# Patient Record
Sex: Female | Born: 1940
Health system: Southern US, Community
[De-identification: ages and names within clinical notes are randomized; demographics above are authoritative.]

## PROBLEM LIST (undated history)

## (undated) DIAGNOSIS — K219 Gastro-esophageal reflux disease without esophagitis: Secondary | ICD-10-CM

## (undated) DIAGNOSIS — D649 Anemia, unspecified: Secondary | ICD-10-CM

## (undated) DIAGNOSIS — Z8619 Personal history of other infectious and parasitic diseases: Secondary | ICD-10-CM

## (undated) DIAGNOSIS — I2510 Atherosclerotic heart disease of native coronary artery without angina pectoris: Secondary | ICD-10-CM

## (undated) DIAGNOSIS — D219 Benign neoplasm of connective and other soft tissue, unspecified: Secondary | ICD-10-CM

## (undated) DIAGNOSIS — I10 Essential (primary) hypertension: Secondary | ICD-10-CM

## (undated) DIAGNOSIS — E039 Hypothyroidism, unspecified: Secondary | ICD-10-CM

## (undated) DIAGNOSIS — E119 Type 2 diabetes mellitus without complications: Secondary | ICD-10-CM

## (undated) DIAGNOSIS — E785 Hyperlipidemia, unspecified: Secondary | ICD-10-CM

## (undated) HISTORY — DX: Personal history of other infectious and parasitic diseases: Z86.19

## (undated) HISTORY — PX: TUBAL LIGATION: SHX77

## (undated) HISTORY — DX: Gastro-esophageal reflux disease without esophagitis: K21.9

## (undated) HISTORY — DX: Atherosclerotic heart disease of native coronary artery without angina pectoris: I25.10

## (undated) HISTORY — DX: Type 2 diabetes mellitus without complications: E11.9

## (undated) HISTORY — DX: Hyperlipidemia, unspecified: E78.5

## (undated) HISTORY — DX: Anemia, unspecified: D64.9

## (undated) HISTORY — DX: Hypothyroidism, unspecified: E03.9

## (undated) HISTORY — DX: Benign neoplasm of connective and other soft tissue, unspecified: D21.9

---

## 2004-08-25 ENCOUNTER — Ambulatory Visit: Payer: Self-pay | Admitting: Internal Medicine

## 2004-09-10 ENCOUNTER — Ambulatory Visit: Payer: Self-pay | Admitting: Internal Medicine

## 2006-12-07 ENCOUNTER — Ambulatory Visit: Payer: Self-pay | Admitting: Internal Medicine

## 2006-12-27 ENCOUNTER — Ambulatory Visit: Payer: Self-pay | Admitting: Gastroenterology

## 2008-01-19 ENCOUNTER — Ambulatory Visit: Payer: Self-pay | Admitting: Internal Medicine

## 2009-01-21 ENCOUNTER — Ambulatory Visit: Payer: Self-pay | Admitting: Internal Medicine

## 2010-04-01 ENCOUNTER — Ambulatory Visit: Payer: Self-pay | Admitting: Internal Medicine

## 2010-04-03 ENCOUNTER — Ambulatory Visit: Payer: Self-pay | Admitting: Cardiology

## 2010-06-16 ENCOUNTER — Ambulatory Visit: Payer: Self-pay | Admitting: Gastroenterology

## 2010-06-19 LAB — PATHOLOGY REPORT

## 2012-04-05 ENCOUNTER — Ambulatory Visit: Payer: Self-pay | Admitting: Internal Medicine

## 2012-04-05 IMAGING — MG MM CAD SCREENING MAMMO
1 series · 4 of 4 positions shown · non-contrast
Comparison: none

REASON FOR EXAM: SCR MAMMO NO ORDER
COMMENTS:

[Series 7731: R CC · right · 4 of 4 slices shown]
[im 1/4]
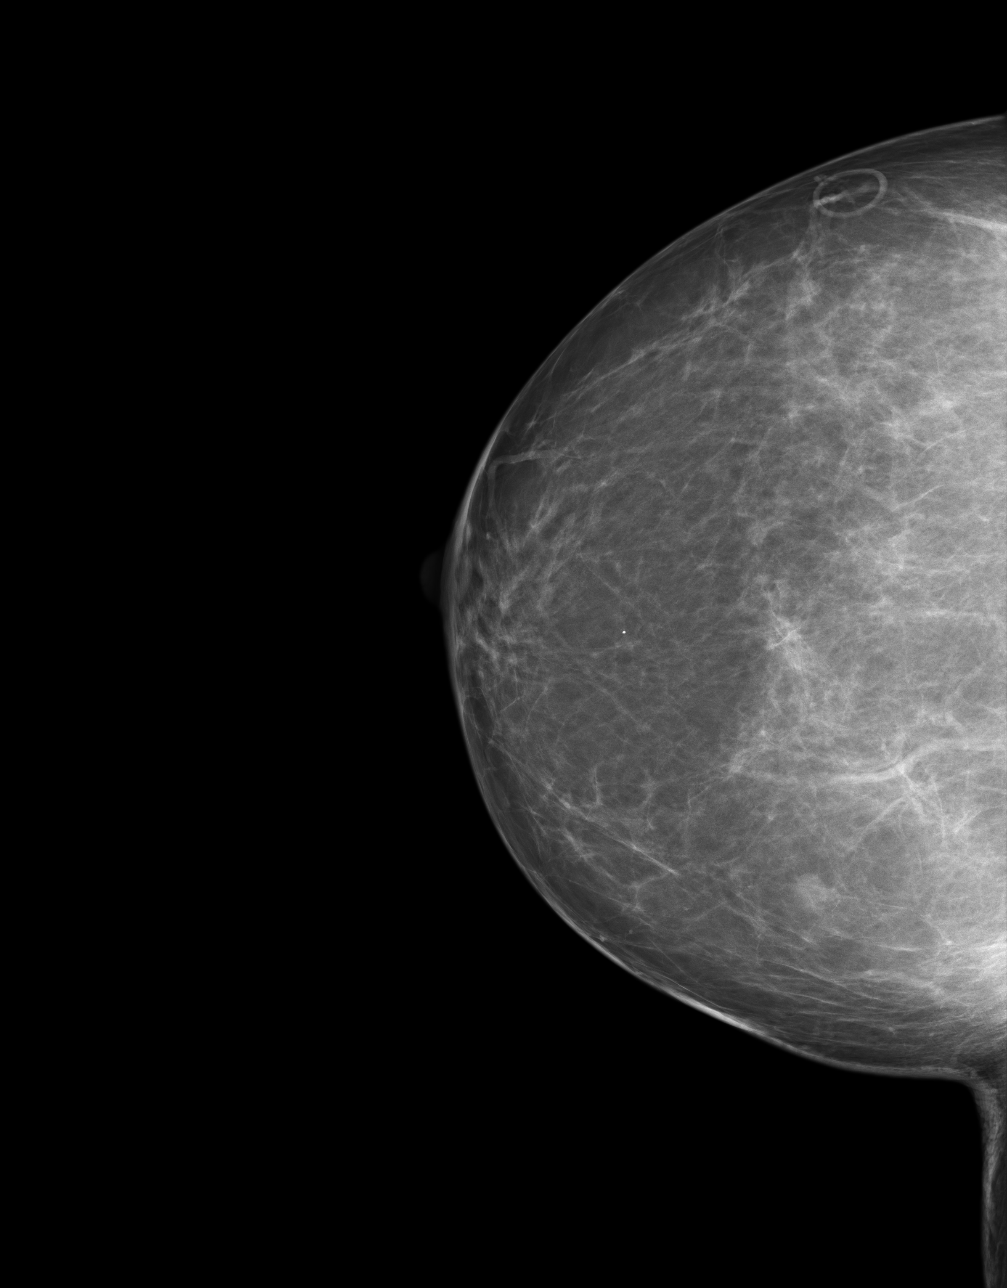
[im 2/4]
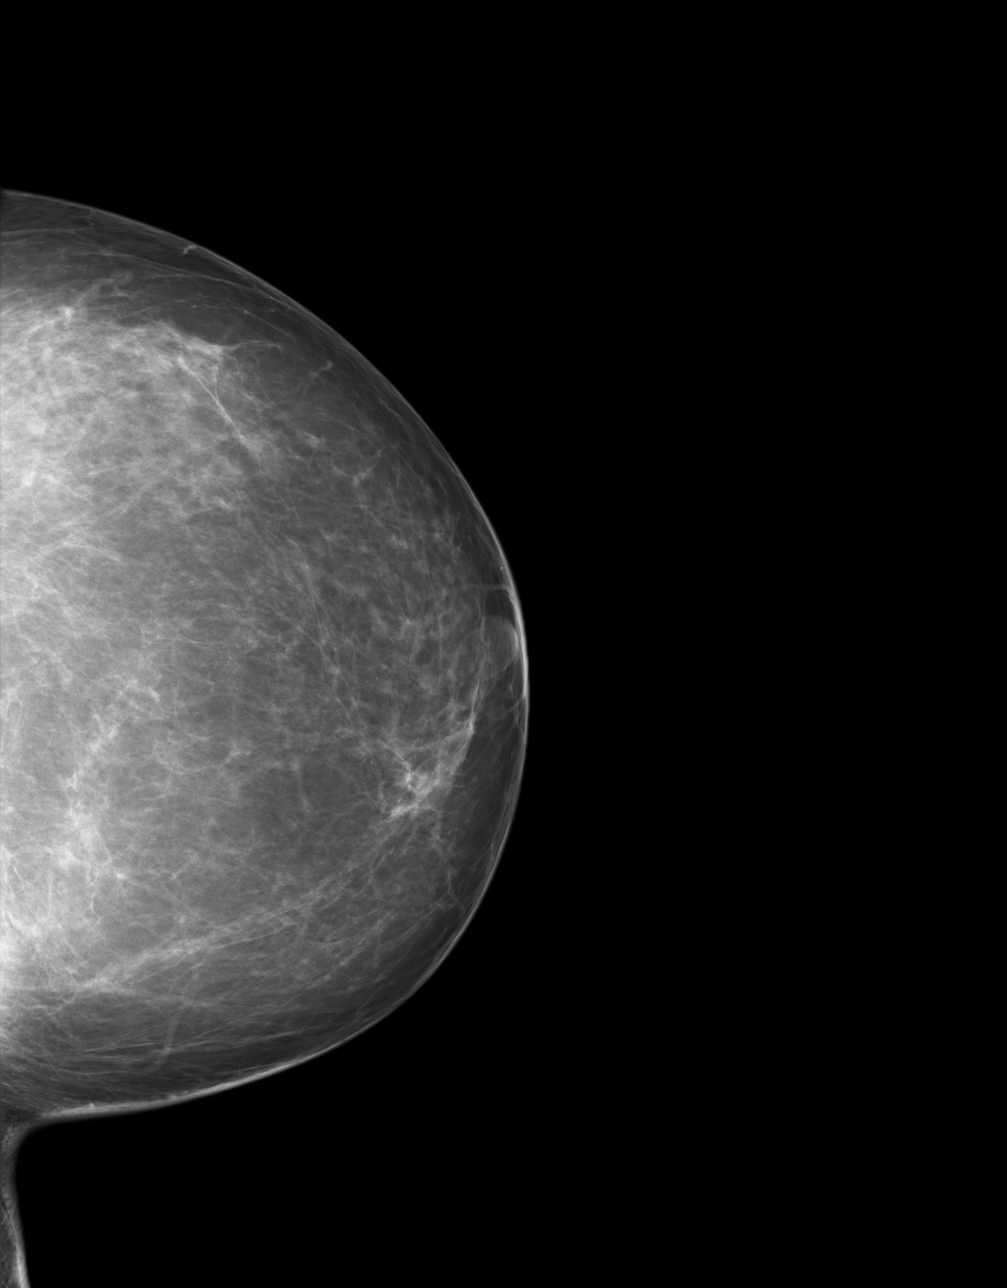
[im 3/4]
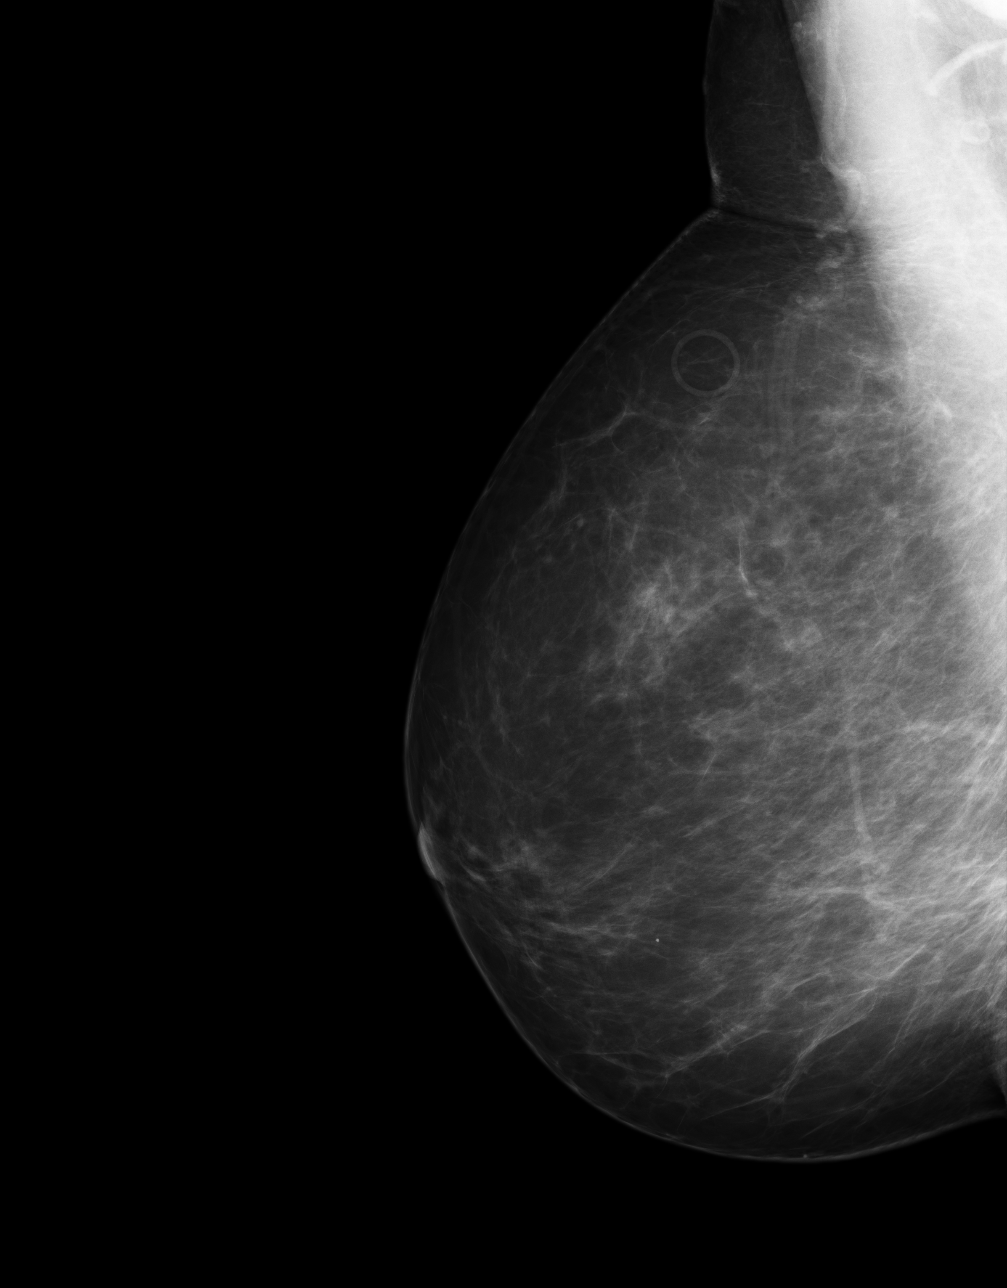
[im 4/4]
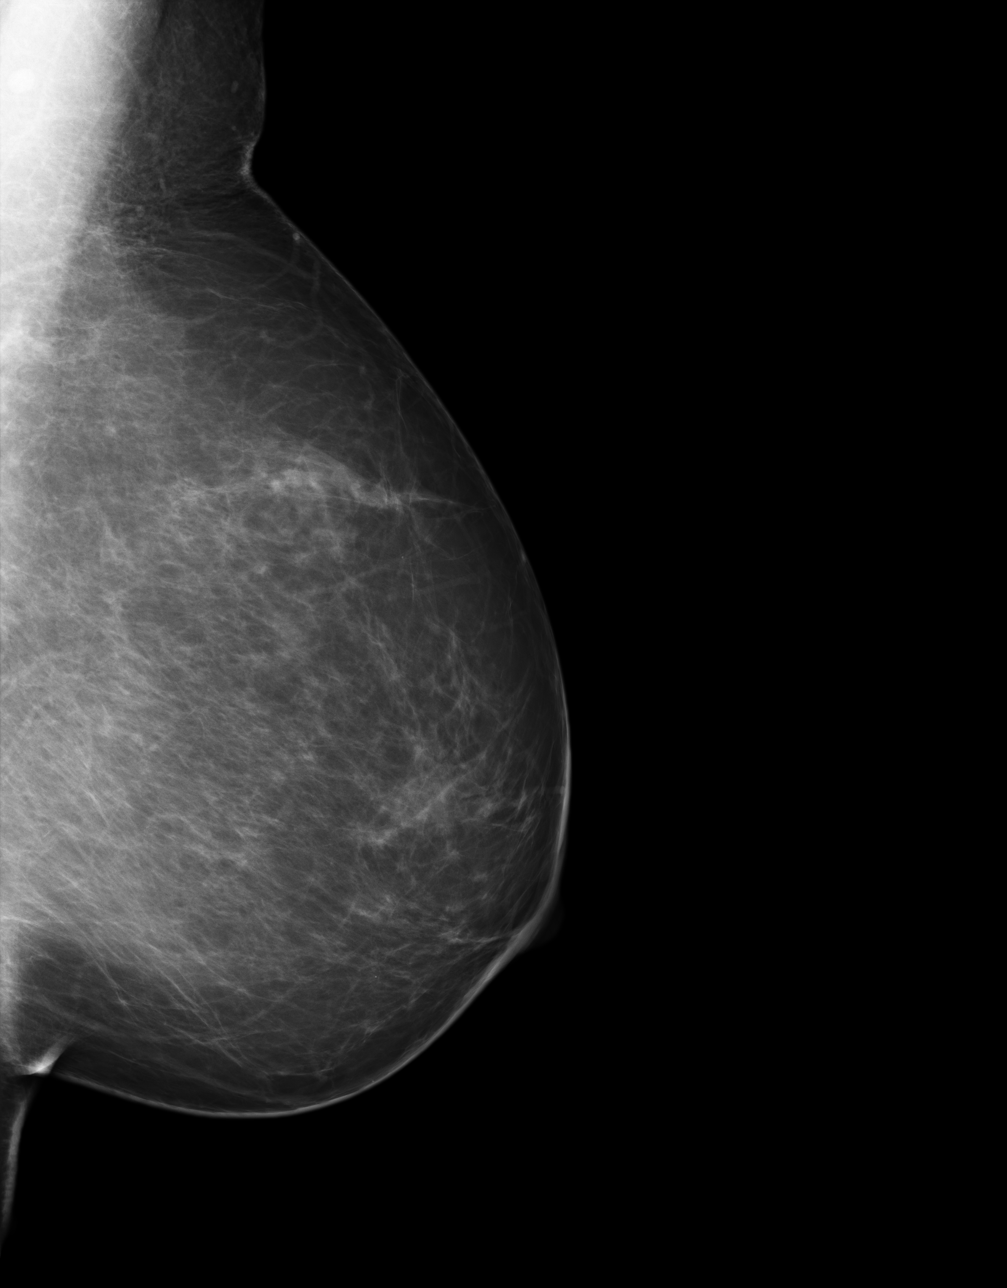

[4 of 4 positions shown; findings below may reference images not displayed]

PROCEDURE:     MAM - MAM DGTL SCRN MAM NO ORDER W/CAD  - [DATE]  [DATE]

RESULT:     There is no known family history of breast cancer. There is no
known history of breast surgery. Comparison is made to previous digital
mammographic images dated [DATE], well as [DATE] and [DATE].  The breasts exhibit a mild to moderately dense parenchymal pattern.
There is an area of nodular increased parenchymal density medially in the
right breast which appears to be grossly stable when compared to multiple
previous studies. No definite developing parenchymal density or dominant
mass is evident otherwise. No malignant appearing calcification or
architectural distortion is evident.
IMPRESSION: 1.Stable, benign appearing bilateral mammogram.

BI-RADS: Category 2 - Benign Findings

RECOMMENDATIONS:

1. Please continue to encourage annual mammographic follow-up.

A NEGATIVE MAMMOGRAM REPORT DOES NOT PRECLUDE BIOPSY OR OTHER EVALUATION OF
A CLINICALLY PALPABLE OR OTHERWISE SUSPICIOUS MASS OR LESION. BREAST CANCER
MAY NOT BE DETECTED BY MAMMOGRAPHY IN UP TO 10% OF CASES.

Dictation Site:1

## 2013-05-01 ENCOUNTER — Ambulatory Visit: Payer: Self-pay | Admitting: Internal Medicine

## 2013-05-01 IMAGING — MG MM CAD SCREENING MAMMO
1 series · 4 of 4 positions shown · non-contrast
Comparison: none

REASON FOR EXAM: SCR MAMMO NO ORDER
COMMENTS:

[R CC · right · 4 of 4 slices shown]
[im 1/4]
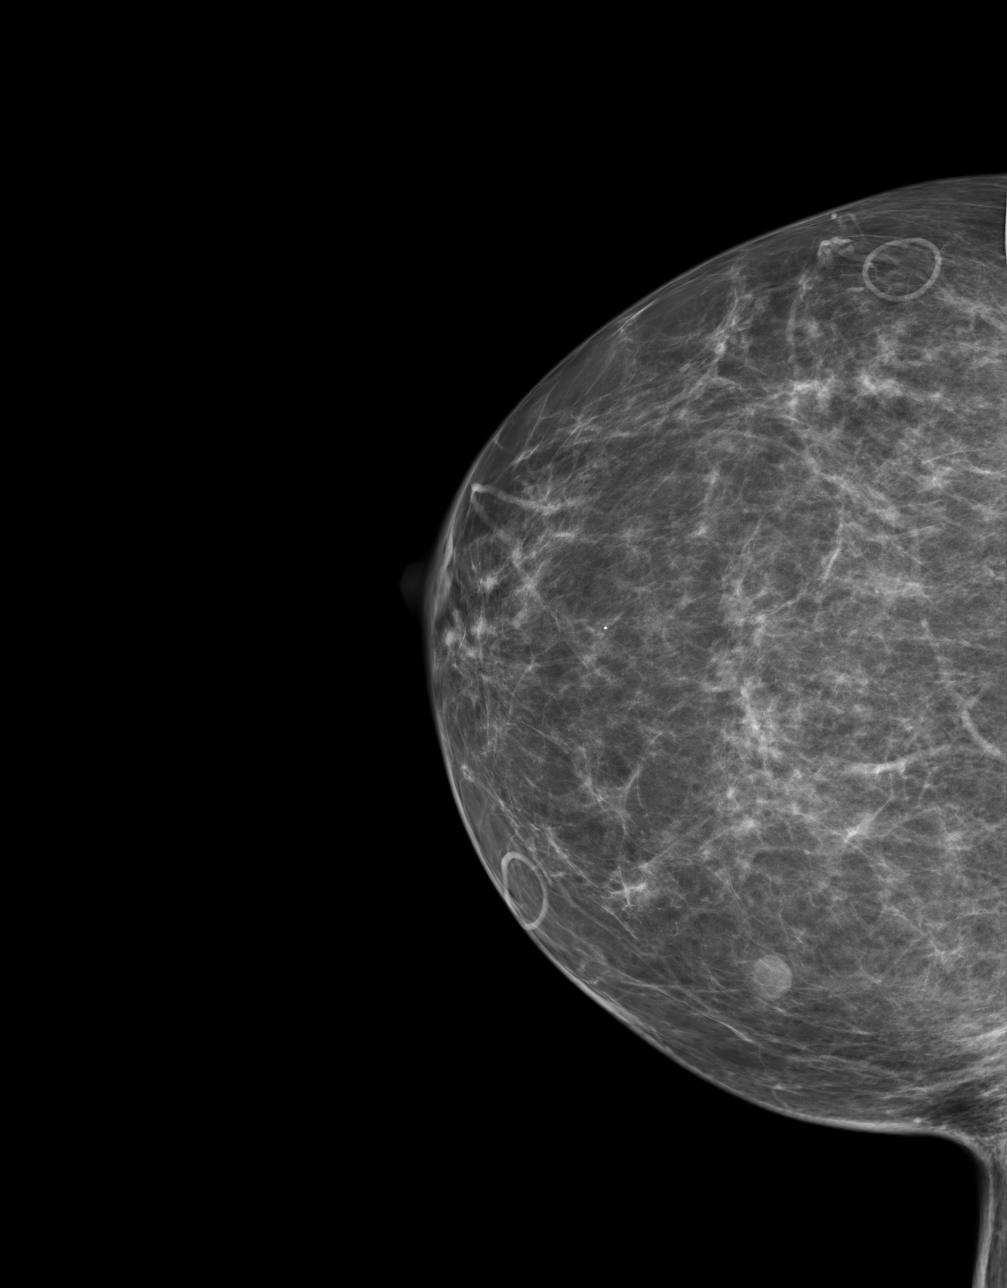
[im 2/4]
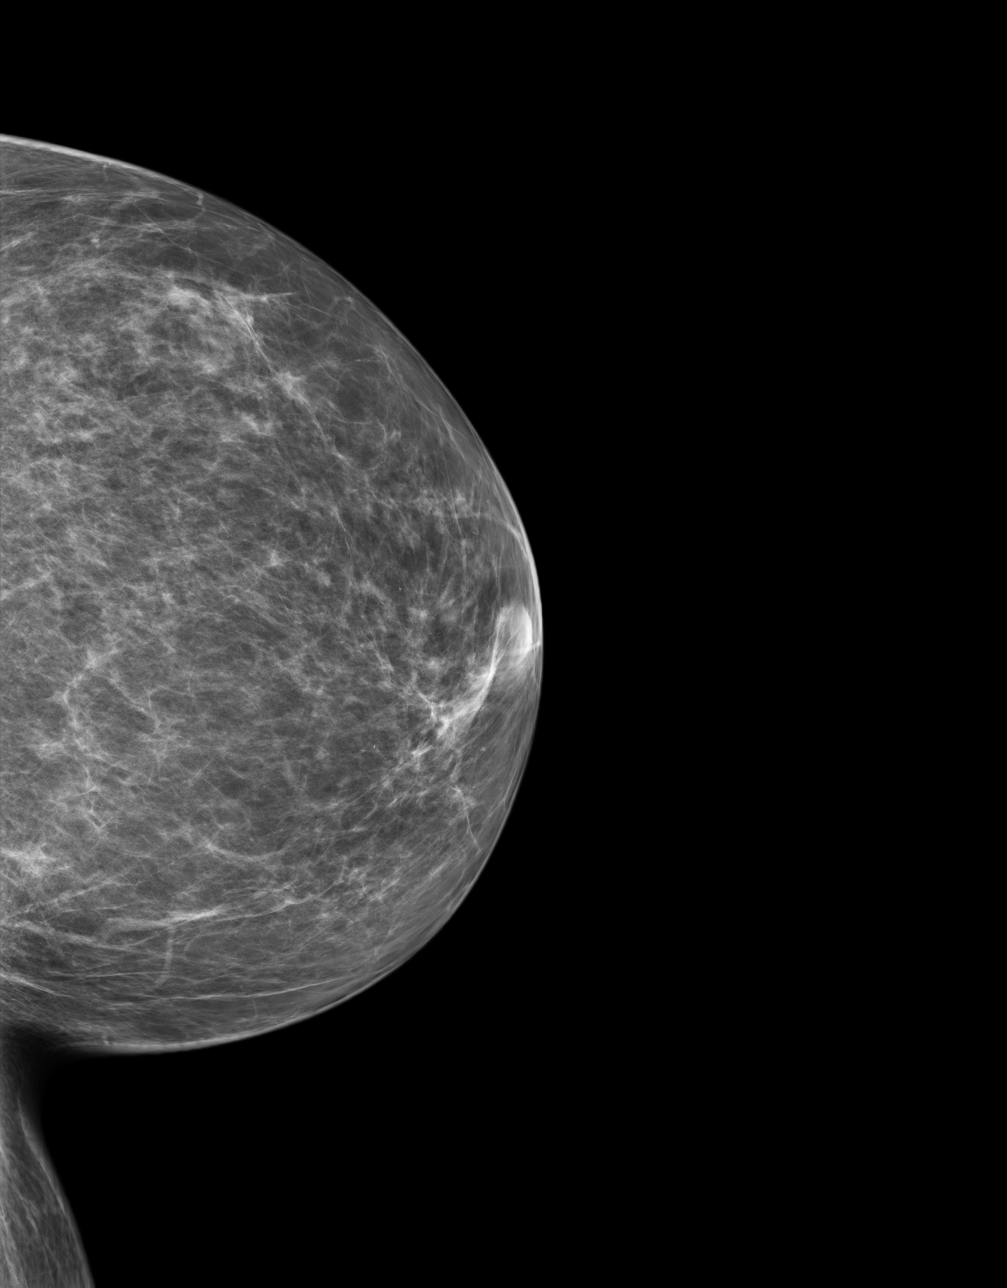
[im 3/4]
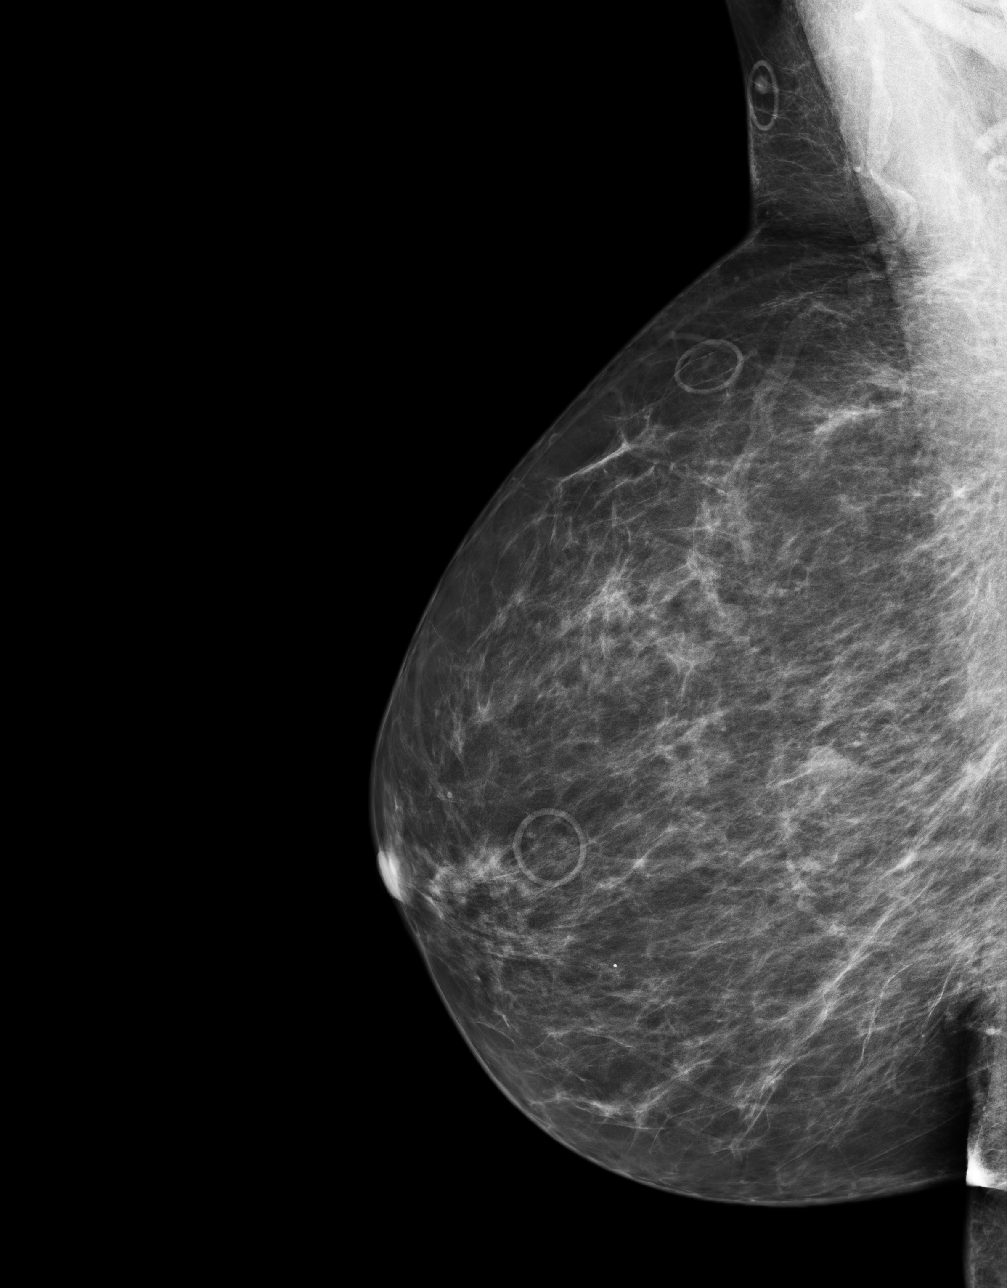
[im 4/4]
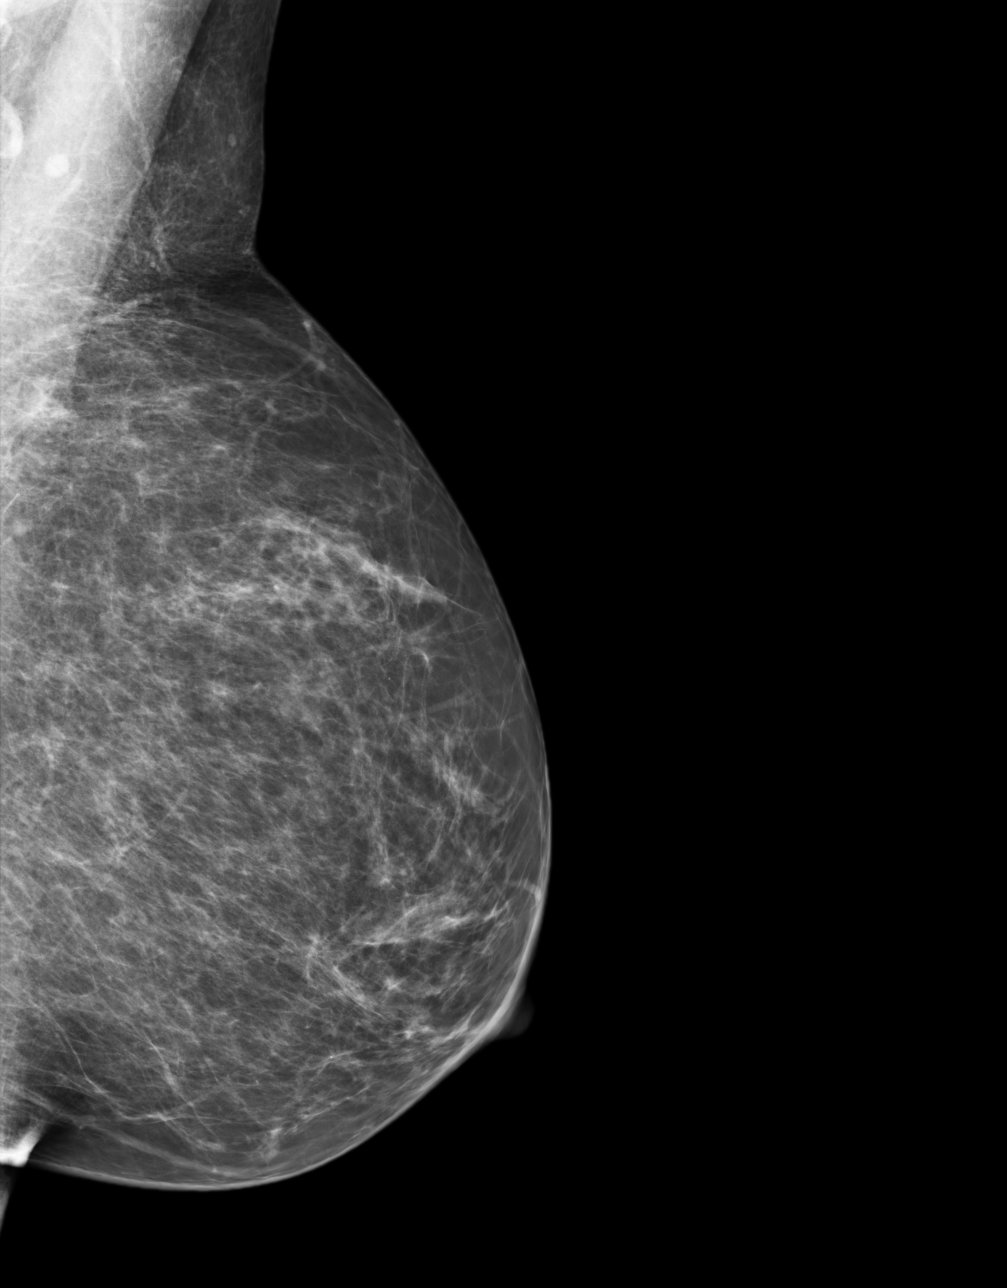

[4 of 4 positions shown; findings below may reference images not displayed]

PROCEDURE:     MAM - MAM DGTL SCRN MAM NO ORDER W/CAD  - [DATE]  [DATE]

RESULT:     Comparison is made to previous digital studies [DATE],[DATE], and [DATE].

The breasts exhibit a scattered fibroglandular pattern. There are
benign-appearing lymph nodes in the axillary regions. Skin nevi were marked
on the right. There is stable nodularity medially in the midportion of the
right breast. There are no malignant appearing groupings of
microcalcification.
IMPRESSION: There are no findings suspicious for malignancy.

BI-RADS 2: Benign findings.

Recommendation: please continue to encourage yearly mammographic followup.

BREAST COMPOSITION: The breast composition is SCATTERED FIBROGLANDULAR
TISSUE (glandular tissue is 25-50%)

A NEGATIVE MAMMOGRAM REPORT DOES NOT PRECLUDE BIOPSY OR OTHER EVALUATION OF
A CLINICALLY PALPABLE OR OTHERWISE SUSPICIOUS MASS OR LESION. BREAST CANCER
MAY NOT BE DETECTED BY MAMMOGRAPHY IN UP TO 10% OF CASES.

Dictation site:1

## 2014-06-25 ENCOUNTER — Ambulatory Visit: Payer: Self-pay | Admitting: Family Medicine

## 2014-06-25 LAB — HM MAMMOGRAPHY

## 2014-06-25 IMAGING — MG MM DIGITAL SCREENING BILAT W/ CAD
5 series · 5 of 5 positions shown · non-contrast
Comparison: Previous exam(s).

CLINICAL DATA: Screening.

EXAM:
DIGITAL SCREENING BILATERAL MAMMOGRAM WITH CAD

[L CC]
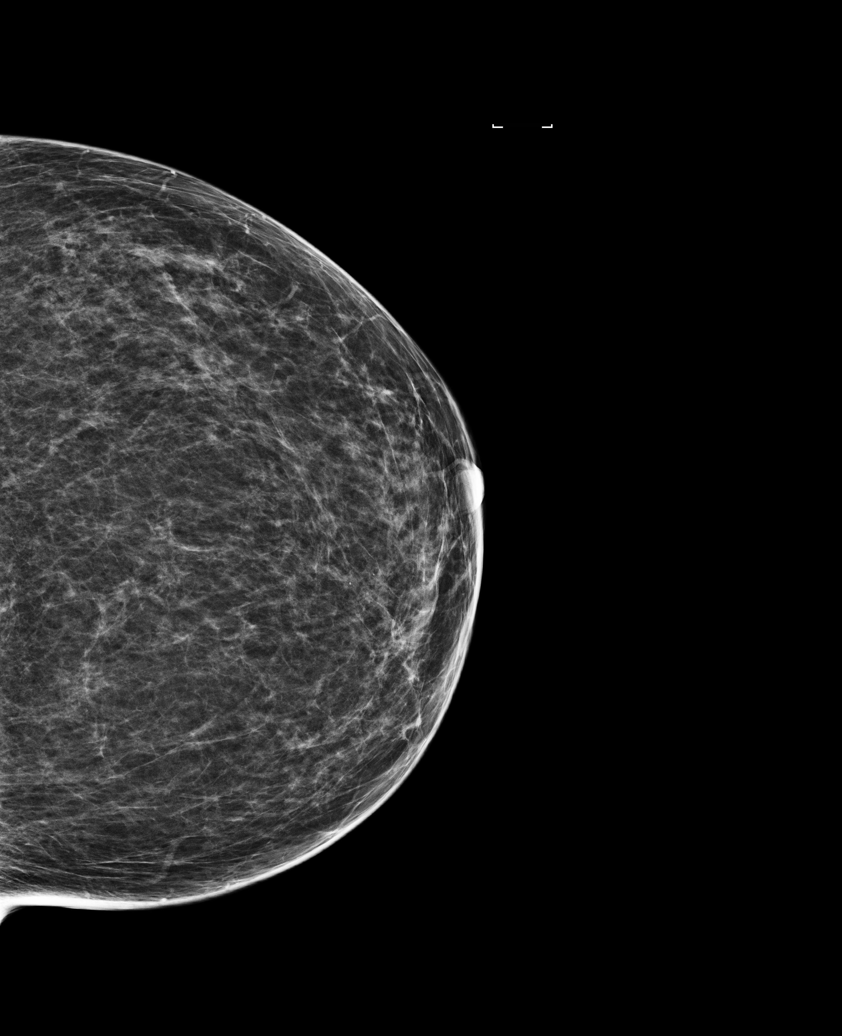

[L MLO]
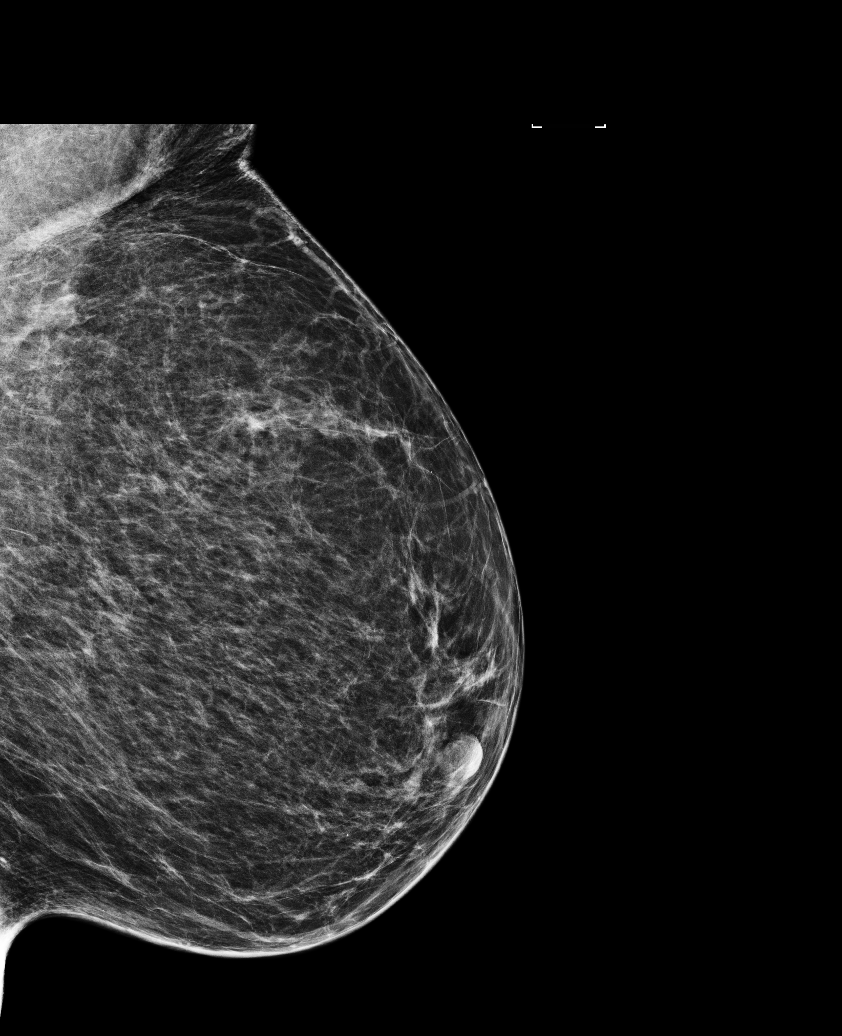

[R CC]
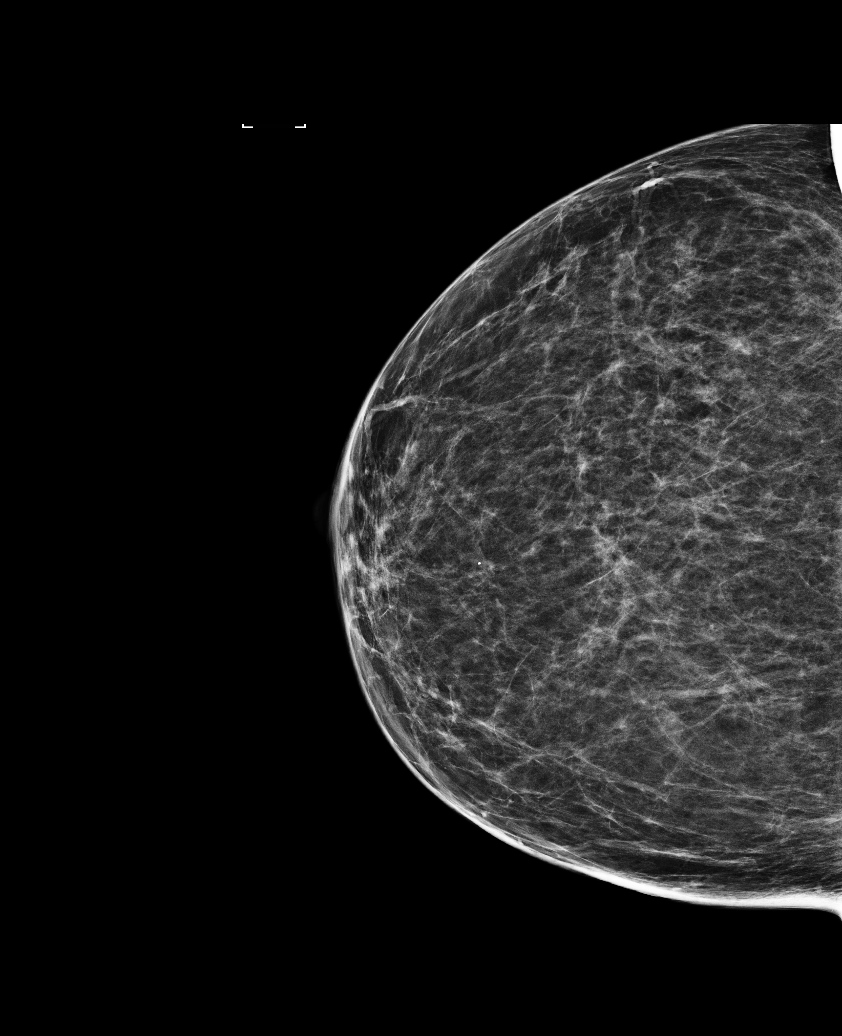

[R XCCL]
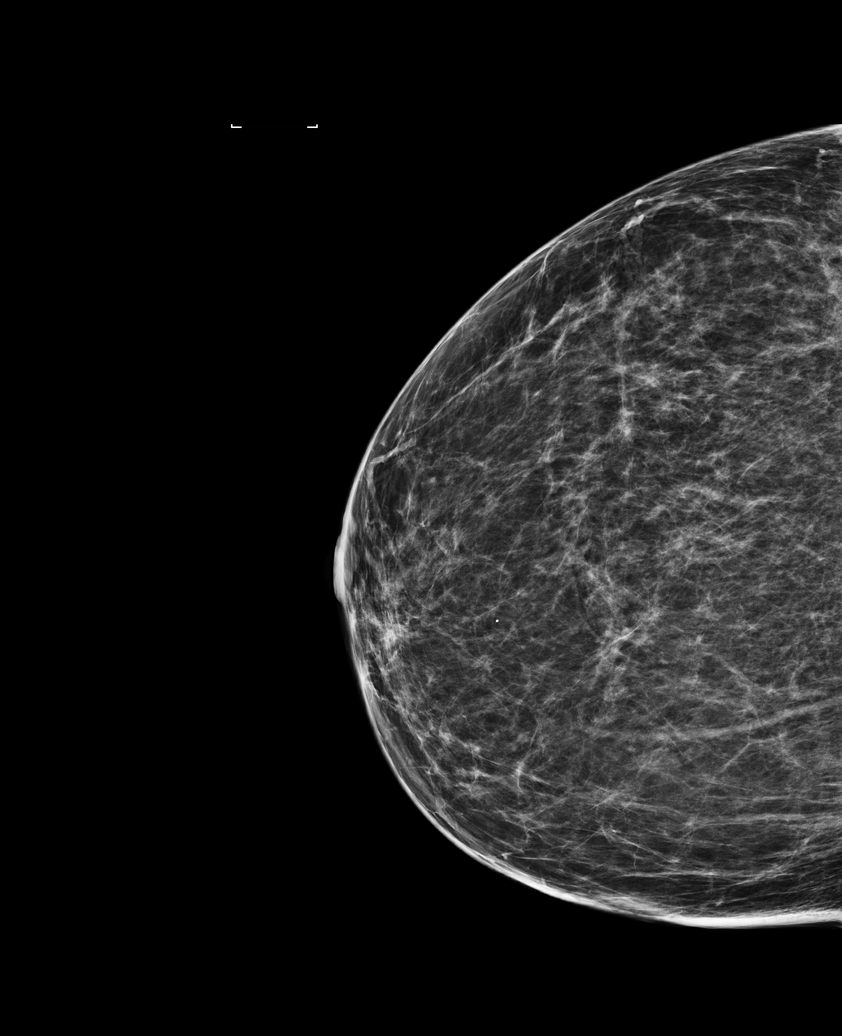

[R MLO]
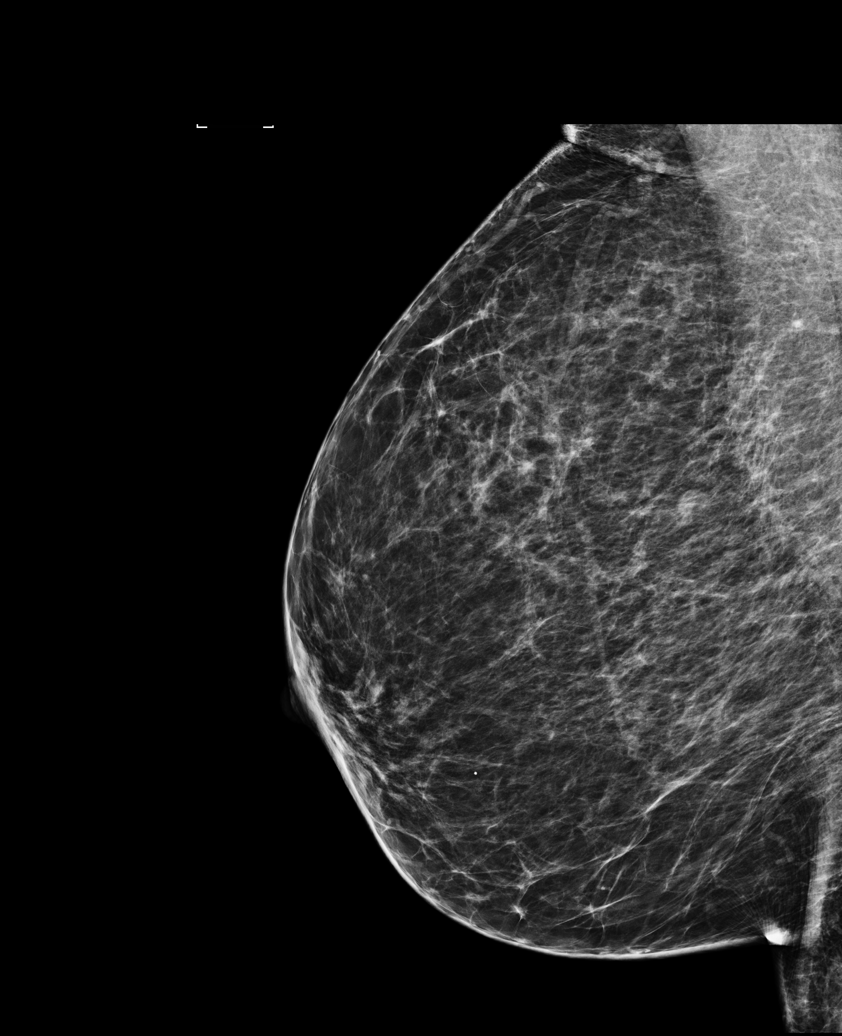

[5 of 5 positions shown; findings below may reference images not displayed]

ACR Breast Density Category b: There are scattered areas of
fibroglandular density.
FINDINGS: There are no findings suspicious for malignancy. Images were
processed with CAD.
IMPRESSION: No mammographic evidence of malignancy. A result letter of this
screening mammogram will be mailed directly to the patient.

RECOMMENDATION:
Screening mammogram in one year. (Code:[US])

BI-RADS CATEGORY  1: Negative.

## 2014-08-06 ENCOUNTER — Encounter (INDEPENDENT_AMBULATORY_CARE_PROVIDER_SITE_OTHER): Payer: Self-pay

## 2014-08-06 ENCOUNTER — Ambulatory Visit (INDEPENDENT_AMBULATORY_CARE_PROVIDER_SITE_OTHER): Payer: Medicare Other | Admitting: Internal Medicine

## 2014-08-06 ENCOUNTER — Encounter: Payer: Self-pay | Admitting: Internal Medicine

## 2014-08-06 VITALS — BP 120/80 | HR 69 | Temp 98.1°F | Ht 63.25 in | Wt 140.1 lb

## 2014-08-06 DIAGNOSIS — D259 Leiomyoma of uterus, unspecified: Secondary | ICD-10-CM

## 2014-08-06 DIAGNOSIS — I10 Essential (primary) hypertension: Secondary | ICD-10-CM

## 2014-08-06 DIAGNOSIS — E7800 Pure hypercholesterolemia, unspecified: Secondary | ICD-10-CM

## 2014-08-06 DIAGNOSIS — H6123 Impacted cerumen, bilateral: Secondary | ICD-10-CM

## 2014-08-06 DIAGNOSIS — E119 Type 2 diabetes mellitus without complications: Secondary | ICD-10-CM

## 2014-08-06 DIAGNOSIS — K219 Gastro-esophageal reflux disease without esophagitis: Secondary | ICD-10-CM

## 2014-08-06 DIAGNOSIS — E038 Other specified hypothyroidism: Secondary | ICD-10-CM

## 2014-08-06 DIAGNOSIS — Z23 Encounter for immunization: Secondary | ICD-10-CM

## 2014-08-06 MED ORDER — CARBAMIDE PEROXIDE 6.5 % OT SOLN: OTIC | Status: DC

## 2014-08-06 NOTE — Progress Notes (Signed)
Pre visit review using our clinic review tool, if applicable. No additional management support is needed unless otherwise documented below in the visit note. 

## 2014-08-06 NOTE — Progress Notes (Signed)
Subjective:    Patient ID: Mariah Moody, female    DOB: 1940-11-07, 73 y.o.   MRN: [id number]  HPI 73 year old female with past history of hypertension, hypercholesterolemia, diabetes, hypothyroidism and GERD who comes in today to follow up on these issues as well as to establish care.  Former pt of Dr Arline Asp and most recently Dr  Rogelia Mire.  She has a history of hypertension and hypercholesterolemia.  Intolerant of statins.  Has tried various statins.  Noted increased fatigue and muscle aches.  Has diabetes.  On no medications.  Has been seeing Dr Enzo Bi.  Diagnosed with fibroids.  All normal paps.  Has GERD.  On protonix in the am.  Still some occasional acid reflux.  Has had EGD.  Does report ears are stopped up.  No congestion.  Previous sore throat, but this resolved.  Has removed some ear wax from her ears.  Had colonoscopy 6 years ago.  Quit smoking 30 years ago.     Past Medical History  Diagnosis Date  . Hypothyroidism   . Diabetes mellitus without complication   . History of chicken pox   . GERD (gastroesophageal reflux disease)   . Hyperlipidemia     Outpatient Encounter Prescriptions as of 08/06/2014  Medication Sig  . CALCIUM PO Take by mouth.  . carbamide peroxide (DEBROX) 6.5 % otic solution 4 drops in each ear daily.  Massage for approximately 5 minutes.  . Cholecalciferol (VITAMIN D PO) Take by mouth.  . Estradiol-Norethindrone Acet [facility] TD) Place onto the skin. 250/50  . hydrochlorothiazide (MICROZIDE) 12.5 MG capsule Take 12.5 mg by mouth daily.  Marland Kitchen levothyroxine (SYNTHROID, LEVOTHROID) 50 MCG tablet Take 50 mcg by mouth daily before breakfast.  . losartan-hydrochlorothiazide (HYZAAR) 50-12.5 MG per tablet Take 1 tablet by mouth daily.  . Multiple Vitamins-Minerals (MULTIVITAMIN PO) Take by mouth. One a Day  . pantoprazole (PROTONIX) 40 MG tablet Take 40 mg by mouth daily.    Review of Systems Patient denies any headache, lightheadedness or dizziness.   No sinus congestion or sore throat.  Describes her ears being stopped up.  No pain.  No chest pain, tightness or palpitations.  No increased shortness of breath, cough or congestion.  No nausea or vomiting.  History of acid reflux.  On protonix.  Still some issues in the evening.  No abdominal pain or cramping.  No bowel change, such as diarrhea, constipation, BRBPR or melana.  No urine change.  Quit smoking 30 years ago.  Seeing Dr Enzo Bi.  Has f/u planned next month.  No pain now.        Objective:   Physical Exam Filed Vitals:   08/06/14 0903  BP: 120/80  Pulse: 69  Temp: 98.1 F (2.54 C)   73 year old female in no acute distress.   HEENT:  Nares- clear.  Oropharynx - without lesions. NECK:  Supple.  Nontender.  No audible bruit.  HEART:  Appears to be regular. LUNGS:  No crackles or wheezing audible.  Respirations even and unlabored.  RADIAL PULSE:  Equal bilaterally. ABDOMEN:  Soft, nontender.  Bowel sounds present and normal.  No audible abdominal bruit.   EXTREMITIES:  No increased edema present.  DP pulses palpable and equal bilaterally.      FEET:  No lesions.       Assessment & Plan:  HEALTH MAINTENANCE.  Schedule her for a physical.  States had mammogram this year.  Colonoscopy 6 years ago.  Obtain records.  1. Encounter for immunization Influenza vaccine given today.    2. Essential hypertension, benign On Losartan/HCTZ and HCTZ as outlined.  Have her spot check her pressure and get her back in soon to reassess.  Check metabolic panel.    3. Hypercholesterolemia Unable to tolerate statins.  Low cholesterol diet and exercise.  Check lipid panel.    4. Diabetes mellitus type II, controlled, with no complications On no medications.  Check metabolic panel and U0E.  Low carb diet/diabetic diet and exercise.  Keep up to date with eye checks.   5. Uterine leiomyoma, unspecified location Seeing Dr Enzo Bi.  No pain now.    6. Gastroesophageal reflux disease,  esophagitis presence not specified Has had EGD.  On protonix.  Some occasional break through symptoms.  Add zantac as directed.  Follow.    7. Cerumen impaction, bilateral Debrox as directed.  Return for ear irrigation.    8. Other specified hypothyroidism On thyroid supplements.  Check tsh.     I spent 45 minutes with the patient and more than 50% of the time was spent in consultation regarding the above, specifically obtaining history, discussing current medication regimen, and some ongoing issues with her ears, etc.

## 2014-08-07 ENCOUNTER — Encounter: Payer: Self-pay | Admitting: Internal Medicine

## 2014-08-07 ENCOUNTER — Telehealth: Payer: Self-pay | Admitting: *Deleted

## 2014-08-07 DIAGNOSIS — K219 Gastro-esophageal reflux disease without esophagitis: Secondary | ICD-10-CM | POA: Insufficient documentation

## 2014-08-07 DIAGNOSIS — I10 Essential (primary) hypertension: Secondary | ICD-10-CM | POA: Insufficient documentation

## 2014-08-07 DIAGNOSIS — E119 Type 2 diabetes mellitus without complications: Secondary | ICD-10-CM | POA: Insufficient documentation

## 2014-08-07 DIAGNOSIS — E7800 Pure hypercholesterolemia, unspecified: Secondary | ICD-10-CM | POA: Insufficient documentation

## 2014-08-07 DIAGNOSIS — E039 Hypothyroidism, unspecified: Secondary | ICD-10-CM | POA: Insufficient documentation

## 2014-08-07 DIAGNOSIS — D259 Leiomyoma of uterus, unspecified: Secondary | ICD-10-CM | POA: Insufficient documentation

## 2014-08-07 DIAGNOSIS — E1165 Type 2 diabetes mellitus with hyperglycemia: Secondary | ICD-10-CM | POA: Insufficient documentation

## 2014-08-07 NOTE — Telephone Encounter (Signed)
Labs ordered.

## 2014-08-07 NOTE — Telephone Encounter (Signed)
Pt is coming in tomorrow what labs and dx?  

## 2014-08-08 ENCOUNTER — Other Ambulatory Visit (INDEPENDENT_AMBULATORY_CARE_PROVIDER_SITE_OTHER): Payer: Medicare Other

## 2014-08-08 DIAGNOSIS — D259 Leiomyoma of uterus, unspecified: Secondary | ICD-10-CM

## 2014-08-08 DIAGNOSIS — E038 Other specified hypothyroidism: Secondary | ICD-10-CM

## 2014-08-08 DIAGNOSIS — E7800 Pure hypercholesterolemia, unspecified: Secondary | ICD-10-CM

## 2014-08-08 DIAGNOSIS — E119 Type 2 diabetes mellitus without complications: Secondary | ICD-10-CM

## 2014-08-08 LAB — CBC WITH DIFFERENTIAL/PLATELET
BASOS ABS: 0 10*3/uL (ref 0.0–0.1)
Basophils Relative: 0.5 % (ref 0.0–3.0)
EOS ABS: 0.2 10*3/uL (ref 0.0–0.7)
Eosinophils Relative: 3.6 % (ref 0.0–5.0)
HCT: 41.1 % (ref 36.0–46.0)
Hemoglobin: 13.6 g/dL (ref 12.0–15.0)
LYMPHS ABS: 1.8 10*3/uL (ref 0.7–4.0)
LYMPHS PCT: 31.1 % (ref 12.0–46.0)
MCHC: 33.2 g/dL (ref 30.0–36.0)
MCV: 92.5 fl (ref 78.0–100.0)
Monocytes Absolute: 0.5 10*3/uL (ref 0.1–1.0)
Monocytes Relative: 8.4 % (ref 3.0–12.0)
Neutro Abs: 3.3 10*3/uL (ref 1.4–7.7)
Neutrophils Relative %: 56.4 % (ref 43.0–77.0)
PLATELETS: 260 10*3/uL (ref 150.0–400.0)
RBC: 4.44 Mil/uL (ref 3.87–5.11)
RDW: 11.9 % (ref 11.5–15.5)
WBC: 5.9 10*3/uL (ref 4.0–10.5)

## 2014-08-08 LAB — LIPID PANEL
Cholesterol: 230 mg/dL — ABNORMAL HIGH (ref 0–200)
HDL: 30.9 mg/dL — AB (ref >39.00)
NonHDL: 199.1
TRIGLYCERIDES: 331 mg/dL — AB (ref 0.0–149.0)
Total CHOL/HDL Ratio: 7
VLDL: 66.2 mg/dL — ABNORMAL HIGH (ref 0.0–40.0)

## 2014-08-08 LAB — TSH: TSH: 4.32 u[IU]/mL (ref 0.35–4.50)

## 2014-08-08 LAB — COMPREHENSIVE METABOLIC PANEL
ALT: 15 U/L (ref 0–35)
AST: 18 U/L (ref 0–37)
Albumin: 3.6 g/dL (ref 3.5–5.2)
Alkaline Phosphatase: 77 U/L (ref 39–117)
BILIRUBIN TOTAL: 1.2 mg/dL (ref 0.2–1.2)
BUN: 18 mg/dL (ref 6–23)
CO2: 28 meq/L (ref 19–32)
Calcium: 9.7 mg/dL (ref 8.4–10.5)
Chloride: 102 mEq/L (ref 96–112)
Creatinine, Ser: 0.7 mg/dL (ref 0.4–1.2)
GFR: 93.73 mL/min (ref >60.00)
Glucose, Bld: 149 mg/dL — ABNORMAL HIGH (ref 70–99)
Potassium: 3.8 mEq/L (ref 3.5–5.1)
SODIUM: 138 meq/L (ref 135–145)
TOTAL PROTEIN: 7.3 g/dL (ref 6.0–8.3)

## 2014-08-08 LAB — LDL CHOLESTEROL, DIRECT: Direct LDL: 129.9 mg/dL

## 2014-08-08 LAB — HEMOGLOBIN A1C: HEMOGLOBIN A1C: 6.7 % — AB (ref 4.6–6.5)

## 2014-08-09 ENCOUNTER — Encounter: Payer: Self-pay | Admitting: Internal Medicine

## 2014-08-15 ENCOUNTER — Ambulatory Visit: Payer: Medicare Other | Admitting: *Deleted

## 2014-08-15 DIAGNOSIS — H6123 Impacted cerumen, bilateral: Secondary | ICD-10-CM

## 2014-08-15 NOTE — Progress Notes (Signed)
Patient ID: Mariah Moody, female   DOB: 01/15/1941, 73 y.o.   MRN: [id number]  Patient was seen today for bilateral wax impaction. Right ear was soaked with Hydrogen Peroxide for about 3 minutes while lying on left side. Patient was then positioned upright & right ear was flushed with a mixture of Hydrogen Peroxide & water. After flushing ear was visualized with otoscope & remainder of wax was removed with wax spoon. Steps were repeated 3 times until ear was cleared. Eardrum was visible & intact. The same process was done to the left ear & eardrum was visible & intact.

## 2014-08-27 ENCOUNTER — Encounter: Payer: Self-pay | Admitting: Internal Medicine

## 2014-11-05 ENCOUNTER — Encounter: Payer: Self-pay | Admitting: Internal Medicine

## 2014-11-05 ENCOUNTER — Ambulatory Visit (INDEPENDENT_AMBULATORY_CARE_PROVIDER_SITE_OTHER): Payer: Medicare Other | Admitting: Internal Medicine

## 2014-11-05 VITALS — BP 150/70 | HR 75 | Temp 98.1°F | Ht 62.75 in | Wt 141.0 lb

## 2014-11-05 DIAGNOSIS — Z83719 Family history of colon polyps, unspecified: Secondary | ICD-10-CM

## 2014-11-05 DIAGNOSIS — E7800 Pure hypercholesterolemia, unspecified: Secondary | ICD-10-CM

## 2014-11-05 DIAGNOSIS — I10 Essential (primary) hypertension: Secondary | ICD-10-CM

## 2014-11-05 DIAGNOSIS — K219 Gastro-esophageal reflux disease without esophagitis: Secondary | ICD-10-CM

## 2014-11-05 DIAGNOSIS — E119 Type 2 diabetes mellitus without complications: Secondary | ICD-10-CM

## 2014-11-05 DIAGNOSIS — E038 Other specified hypothyroidism: Secondary | ICD-10-CM

## 2014-11-05 DIAGNOSIS — D259 Leiomyoma of uterus, unspecified: Secondary | ICD-10-CM

## 2014-11-05 DIAGNOSIS — Z8371 Family history of colonic polyps: Secondary | ICD-10-CM

## 2014-11-05 LAB — COMPREHENSIVE METABOLIC PANEL
ALBUMIN: 4.1 g/dL (ref 3.5–5.2)
ALT: 16 U/L (ref 0–35)
AST: 21 U/L (ref 0–37)
Alkaline Phosphatase: 72 U/L (ref 39–117)
BILIRUBIN TOTAL: 1.1 mg/dL (ref 0.2–1.2)
BUN: 15 mg/dL (ref 6–23)
CO2: 28 meq/L (ref 19–32)
Calcium: 9.3 mg/dL (ref 8.4–10.5)
Chloride: 105 mEq/L (ref 96–112)
Creatinine, Ser: 0.7 mg/dL (ref 0.4–1.2)
GFR: 88.98 mL/min (ref >60.00)
Glucose, Bld: 136 mg/dL — ABNORMAL HIGH (ref 70–99)
Potassium: 4.3 mEq/L (ref 3.5–5.1)
SODIUM: 142 meq/L (ref 135–145)
TOTAL PROTEIN: 6.9 g/dL (ref 6.0–8.3)

## 2014-11-05 LAB — HEMOGLOBIN A1C: Hgb A1c MFr Bld: 7 % — ABNORMAL HIGH (ref 4.6–6.5)

## 2014-11-05 LAB — MICROALBUMIN / CREATININE URINE RATIO
Creatinine,U: 146.9 mg/dL
MICROALB/CREAT RATIO: 0.5 mg/g (ref 0.0–30.0)
Microalb, Ur: 0.7 mg/dL (ref 0.0–1.9)

## 2014-11-05 LAB — LIPID PANEL
CHOL/HDL RATIO: 7
Cholesterol: 232 mg/dL — ABNORMAL HIGH (ref 0–200)
HDL: 34.2 mg/dL — ABNORMAL LOW (ref >39.00)
NONHDL: 197.8
TRIGLYCERIDES: 261 mg/dL — AB (ref 0.0–149.0)
VLDL: 52.2 mg/dL — ABNORMAL HIGH (ref 0.0–40.0)

## 2014-11-05 LAB — LDL CHOLESTEROL, DIRECT: Direct LDL: 126.5 mg/dL

## 2014-11-05 NOTE — Progress Notes (Signed)
Subjective:    Patient ID: Mariah Moody, female    DOB: 1941-09-14, 74 y.o.   MRN: [id number]  HPI 74 year old female with past history of hypertension, hypercholesterolemia, diabetes, hypothyroidism and GERD who comes in today to follow up on these issues as well as for a complete physical exam.  She has a history of hypertension and hypercholesterolemia.  Intolerant of statins.  Has tried various statins.  Noted increased fatigue and muscle aches.  Has diabetes.  On no medications.  Stays active.  Has been seeing Dr Enzo Bi.  Diagnosed with fibroids.  All normal paps.  Just evaluated.  On combipatch.  Stable.  Has GERD.  On protonix in the am.  Has had EGD.  States if she watches what she eats and does not eat late and takes her medication regularly - controlled.  Ears better after ear irrigation.  No congestion.   Quit smoking 30 years ago.     Past Medical History  Diagnosis Date  . Hypothyroidism   . Diabetes mellitus without complication   . History of chicken pox   . GERD (gastroesophageal reflux disease)   . Hyperlipidemia     Outpatient Encounter Prescriptions as of 11/05/2014  Medication Sig  . CALCIUM PO Take by mouth.  . Cholecalciferol (VITAMIN D PO) Take by mouth.  . Estradiol-Norethindrone Acet [facility] TD) Place onto the skin. 250/50  . hydrochlorothiazide (MICROZIDE) 12.5 MG capsule Take 12.5 mg by mouth daily.  Marland Kitchen levothyroxine (SYNTHROID, LEVOTHROID) 50 MCG tablet Take 50 mcg by mouth daily before breakfast.  . losartan-hydrochlorothiazide (HYZAAR) 50-12.5 MG per tablet Take 1 tablet by mouth daily.  . Multiple Vitamins-Minerals (MULTIVITAMIN PO) Take by mouth. One a Day  . pantoprazole (PROTONIX) 40 MG tablet Take 40 mg by mouth daily.  . [DISCONTINUED] carbamide peroxide (DEBROX) 6.5 % otic solution 4 drops in each ear daily.  Massage for approximately 5 minutes.    Review of Systems Patient denies any headache, lightheadedness or dizziness.  No sinus  congestion or sore throat.  No ear fullness.  No pain.  No chest pain, tightness or palpitations.  No increased shortness of breath, cough or congestion.  No nausea or vomiting.  Reflux controlled.  On protonix.  See above.   No abdominal pain or cramping.  No bowel change, such as diarrhea, constipation, BRBPR or melana.  No urine change.  Quit smoking 30 years ago.  Seeing Dr Enzo Bi.  Just evaluated.  Stable.          Objective:   Physical Exam  Filed Vitals:   11/05/14 0849  BP: 150/70  Pulse: 75  Temp: 98.1 F (36.7 C)   Blood pressure recheck:  54/44  74 year old female in no acute distress.   HEENT:  Nares- clear.  Oropharynx - without lesions. NECK:  Supple.  Nontender.  No audible bruit.  HEART:  Appears to be regular. LUNGS:  No crackles or wheezing audible.  Respirations even and unlabored.  RADIAL PULSE:  Equal bilaterally.    BREASTS:  No nipple discharge or nipple retraction present.  Could not appreciate any distinct nodules or axillary adenopathy.  ABDOMEN:  Soft, nontender.  Bowel sounds present and normal.  No audible abdominal bruit.  GU:  Performed by gyn.    EXTREMITIES:  No increased edema present.  DP pulses palpable and equal bilaterally.      FEET:  No lesions.  See simple foot exam.       Assessment &  Plan:  1. Essential hypertension, benign Blood pressure slightly elevated today.  Have her spot check her pressure.  Get her back in soon to reassess.  Hold on changing medications at this time.   - Comprehensive metabolic panel  2. Diabetes mellitus type II, controlled, with no complications Low carb diet and exercise.  Follow sugars.  Keep up to date with eye exams.   - Hemoglobin A1c - Microalbumin / creatinine urine ratio  3. Other specified hypothyroidism On thyroid replacement.  Follow tsh.    4. Gastroesophageal reflux disease, esophagitis presence not specified Controlled if she takes her protonix regularly and avoids eating late.   5.  Uterine leiomyoma, unspecified location Followed by gyn.  Just evaluated.  Stable.    6. Hypercholesterolemia Low cholesterol diet and exercise.  Follow lipid panel.   - Lipid panel  7. Family history of colonic polyps Pt reports overdue colonoscopy.   - Ambulatory referral to Gastroenterology  HEALTH MAINTENANCE.  Physical today.   States had mammogram this year.  Obtain results.  Mother had polyps.  States overdue f/u colonoscopy.  Refer to GI.     I spent 25 minutes with the patient and more than 50% of the time was spent in consultation regarding the above.

## 2014-11-05 NOTE — Progress Notes (Signed)
Pre visit review using our clinic review tool, if applicable. No additional management support is needed unless otherwise documented below in the visit note. 

## 2014-11-06 ENCOUNTER — Encounter: Payer: Self-pay | Admitting: Internal Medicine

## 2014-11-07 NOTE — Telephone Encounter (Signed)
Unread mychart message mailed to patient 

## 2015-01-08 ENCOUNTER — Encounter: Payer: Self-pay | Admitting: Internal Medicine

## 2015-01-08 ENCOUNTER — Ambulatory Visit (INDEPENDENT_AMBULATORY_CARE_PROVIDER_SITE_OTHER): Payer: Medicare Other | Admitting: Internal Medicine

## 2015-01-08 VITALS — BP 156/79 | HR 76 | Temp 97.7°F | Ht 62.75 in | Wt 140.1 lb

## 2015-01-08 DIAGNOSIS — E7800 Pure hypercholesterolemia, unspecified: Secondary | ICD-10-CM

## 2015-01-08 DIAGNOSIS — I10 Essential (primary) hypertension: Secondary | ICD-10-CM

## 2015-01-08 DIAGNOSIS — Z0000 Encounter for general adult medical examination without abnormal findings: Secondary | ICD-10-CM

## 2015-01-08 DIAGNOSIS — E119 Type 2 diabetes mellitus without complications: Secondary | ICD-10-CM

## 2015-01-08 DIAGNOSIS — Z8371 Family history of colonic polyps: Secondary | ICD-10-CM

## 2015-01-08 DIAGNOSIS — K219 Gastro-esophageal reflux disease without esophagitis: Secondary | ICD-10-CM

## 2015-01-08 DIAGNOSIS — J111 Influenza due to unidentified influenza virus with other respiratory manifestations: Secondary | ICD-10-CM

## 2015-01-08 DIAGNOSIS — Z83719 Family history of colon polyps, unspecified: Secondary | ICD-10-CM

## 2015-01-08 DIAGNOSIS — E038 Other specified hypothyroidism: Secondary | ICD-10-CM

## 2015-01-08 NOTE — Progress Notes (Signed)
Patient ID: Mariah Moody, female   DOB: 09-21-1941, 74 y.o.   MRN: [id number]   Subjective:    Patient ID: Mariah Moody, female    DOB: 01/12/1941, 74 y.o.   MRN: [id number]  HPI  Patient here for a scheduled follow up.  States she was diagnosed with the flu a few days ago.  On Tamiflu.  Is better.  Some nasal congestion and some head congestion.  Feels better.  No nausea, vomiting or diarrhea.  No abdominal pain.  We discussed her diet.  Has been watching her carbs.  Has started walking.  We discussed diabetes and diabetic diet.  Discussed Lifestyles referral.     Past Medical History  Diagnosis Date  . Hypothyroidism   . Diabetes mellitus without complication   . History of chicken pox   . GERD (gastroesophageal reflux disease)   . Hyperlipidemia     Current Outpatient Prescriptions on File Prior to Visit  Medication Sig Dispense Refill  . CALCIUM PO Take by mouth.    . Cholecalciferol (VITAMIN D PO) Take by mouth.    . Estradiol-Norethindrone Acet [facility] TD) Place onto the skin. 250/50    . hydrochlorothiazide (MICROZIDE) 12.5 MG capsule Take 12.5 mg by mouth daily.    Marland Kitchen levothyroxine (SYNTHROID, LEVOTHROID) 50 MCG tablet Take 50 mcg by mouth daily before breakfast.    . losartan-hydrochlorothiazide (HYZAAR) 50-12.5 MG per tablet Take 1 tablet by mouth daily.    . Multiple Vitamins-Minerals (MULTIVITAMIN PO) Take by mouth. One a Day    . pantoprazole (PROTONIX) 40 MG tablet Take 40 mg by mouth daily.     No current facility-administered medications on file prior to visit.    Review of Systems  Constitutional: Negative for appetite change and unexpected weight change.  HENT: Positive for congestion (some head and nasal congestion.  has improved.  ). Negative for sinus pressure.   Respiratory: Negative for cough, chest tightness and shortness of breath.   Cardiovascular: Negative for chest pain, palpitations and leg swelling.  Gastrointestinal: Negative for  nausea, vomiting, abdominal pain and diarrhea.  Skin: Negative for color change and rash.  Neurological: Negative for dizziness, light-headedness and headaches.       Objective:     Blood pressure recheck:  136/80 Physical Exam  Constitutional: She appears well-developed and well-nourished. No distress.  HENT:  Nose: Nose normal.  Mouth/Throat: Oropharynx is clear and moist.  Neck: Neck supple. No thyromegaly present.  Cardiovascular: Normal rate and regular rhythm.   Pulmonary/Chest: Breath sounds normal. No respiratory distress. She has no wheezes.  Abdominal: Soft. Bowel sounds are normal. There is no tenderness.  Musculoskeletal: She exhibits no edema or tenderness.  Lymphadenopathy:    She has no cervical adenopathy.  Skin: No rash noted. No erythema.    BP 156/79 mmHg  Pulse 76  Temp(Src) 97.7 F (36.5 C) (Oral)  Ht 5' 2.75" (1.594 m)  Wt 140 lb 2 oz (63.56 kg)  BMI 25.02 kg/m2  SpO2 96% Wt Readings from Last 3 Encounters:  01/08/15 140 lb 2 oz (63.56 kg)  11/05/14 141 lb (63.957 kg)  08/06/14 140 lb 1.9 oz (63.558 kg)     Lab Results  Component Value Date   WBC 5.9 08/08/2014   HGB 13.6 08/08/2014   HCT 41.1 08/08/2014   PLT 260.0 08/08/2014   GLUCOSE 136* 11/05/2014   CHOL 232* 11/05/2014   TRIG 261.0* 11/05/2014   HDL 34.20* 11/05/2014   LDLDIRECT 126.5 11/05/2014  ALT 16 11/05/2014   AST 21 11/05/2014   NA 142 11/05/2014   K 4.3 11/05/2014   CL 105 11/05/2014   CREATININE 0.7 11/05/2014   BUN 15 11/05/2014   CO2 28 11/05/2014   TSH 4.32 08/08/2014   HGBA1C 7.0* 11/05/2014   MICROALBUR 0.7 11/05/2014       Assessment & Plan:   Problem List Items Addressed This Visit    Diabetes mellitus type II, controlled, with no complications    R4W 7.0 on last check.  Low carb diet and exercise.  Follow sugars.  Discussed Lifestyles.       Relevant Orders   Hemoglobin A1c   Essential hypertension, benign - Primary    Blood pressure on recheck  improved.  Have her spot check her pressure.  Follow pressures and metabolic panel.        Relevant Orders   Comprehensive metabolic panel   Family history of colonic polyps    Colonoscopy 02/18/15.        GERD (gastroesophageal reflux disease)    Controlled on protonix.        Health care maintenance    Physical 11/05/14.  Colonoscopy 02/18/15.  States up to date with mammogram.        Hypercholesterolemia    Low cholesterol diet and exercise.  Follow lipid panel.  Last LDL 126.        Relevant Orders   Lipid panel   Hypothyroidism    On thyroid replacement.  Follow tsh.       Influenza    Being treated with tamiflu.  Feels better.  Treat symptoms.  Robitussin, saline nasal spray and nasacort nasal spray as directed.  Follow.  Rest.  Fluids.          I spent 25 minutes with the patient and more than 50% of the time was spent in consultation regarding the above.     Einar Pheasant, MD

## 2015-01-08 NOTE — Progress Notes (Signed)
Pre visit review using our clinic review tool, if applicable. No additional management support is needed unless otherwise documented below in the visit note. 

## 2015-01-08 NOTE — Patient Instructions (Signed)
Saline nasal spray - flush nose at least 2-3x/day  Nasacort nasal spray - 2 sprays each nostril one time per day.  Do this in the evening.    Robitussin DM twice a day as needed.

## 2015-01-13 DIAGNOSIS — Z0000 Encounter for general adult medical examination without abnormal findings: Secondary | ICD-10-CM | POA: Insufficient documentation

## 2015-01-13 NOTE — Assessment & Plan Note (Signed)
Low cholesterol diet and exercise.  Follow lipid panel.  Last LDL 126.

## 2015-01-13 NOTE — Assessment & Plan Note (Signed)
Being treated with tamiflu.  Feels better.  Treat symptoms.  Robitussin, saline nasal spray and nasacort nasal spray as directed.  Follow.  Rest.  Fluids.

## 2015-01-13 NOTE — Assessment & Plan Note (Signed)
Blood pressure on recheck improved.  Have her spot check her pressure.  Follow pressures and metabolic panel.

## 2015-01-13 NOTE — Assessment & Plan Note (Signed)
Colonoscopy 02/18/15.

## 2015-01-13 NOTE — Assessment & Plan Note (Signed)
On thyroid replacement.  Follow tsh.  

## 2015-01-13 NOTE — Assessment & Plan Note (Signed)
Physical 11/05/14.  Colonoscopy 02/18/15.  States up to date with mammogram.

## 2015-01-13 NOTE — Assessment & Plan Note (Signed)
Controlled on protonix.   

## 2015-01-13 NOTE — Assessment & Plan Note (Addendum)
A1c 7.0 on last check.  Low carb diet and exercise.  Follow sugars.  Discussed Lifestyles.

## 2015-02-28 ENCOUNTER — Telehealth: Payer: Self-pay | Admitting: *Deleted

## 2015-02-28 ENCOUNTER — Encounter: Payer: Self-pay | Admitting: *Deleted

## 2015-02-28 MED ORDER — LOSARTAN POTASSIUM-HCTZ 50-12.5 MG PO TABS: 1.0000 | ORAL_TABLET | Freq: Every day | ORAL | Status: DC

## 2015-02-28 MED ORDER — LEVOTHYROXINE SODIUM 50 MCG PO TABS: 50.0000 ug | ORAL_TABLET | Freq: Every day | ORAL | Status: DC

## 2015-02-28 MED ORDER — HYDROCHLOROTHIAZIDE 12.5 MG PO CAPS: 12.5000 mg | ORAL_CAPSULE | Freq: Every day | ORAL | Status: DC

## 2015-02-28 NOTE — Telephone Encounter (Signed)
Need to have pt confirm on her package what dose combipatch listed.  Do not have 250/50 to choose from.  Thanks.

## 2015-02-28 NOTE — Telephone Encounter (Signed)
Pt is requesting a refill on her Estalis patch 250/50 (Combipatch). She would like a printed Rx faxed to [facility] out of [place]. Fax # [phone]. I did not pend the medication because it kept asking me for an alternative dose.

## 2015-02-28 NOTE — Telephone Encounter (Signed)
LMTCB & sent mychart message 

## 2015-03-01 NOTE — Telephone Encounter (Signed)
Left second message on voice mail.

## 2015-03-04 MED ORDER — ESTRADIOL-NORETHINDRONE ACET 0.05-0.25 MG/DAY TD PTTW: 1.0000 | MEDICATED_PATCH | TRANSDERMAL | Status: DC

## 2015-03-04 NOTE — Telephone Encounter (Signed)
Sent message to Dr. Nicki Reaper to refill correct dose

## 2015-03-04 NOTE — Telephone Encounter (Signed)
rx sent in for combipatch 3 months with one year.  Marland Kitchen05/.25

## 2015-03-07 ENCOUNTER — Other Ambulatory Visit: Payer: Self-pay | Admitting: *Deleted

## 2015-03-07 MED ORDER — ESTRADIOL-NORETHINDRONE ACET 0.05-0.25 MG/DAY TD PTTW
1.0000 | MEDICATED_PATCH | TRANSDERMAL | Status: DC
Start: 2015-03-07 — End: 2016-04-24

## 2015-03-07 NOTE — Telephone Encounter (Signed)
Rx was originally sent to wrong pharmacy. I have contacted [facility] & cancelled the Rx. Rx faxed to [facility] in [place] at patients request.

## 2015-03-07 NOTE — Telephone Encounter (Signed)
I called [facility] to cancel Rx sent in on 03/04/15. Per documentation, patient wanted Rx faxed to a [facility] out of [place].

## 2015-04-15 ENCOUNTER — Encounter
Admission: RE | Disposition: A | Discharge status: Home / Self Care | Payer: Self-pay | Source: Ambulatory Visit | Attending: Gastroenterology

## 2015-04-15 ENCOUNTER — Ambulatory Visit: Payer: Medicare Other | Admitting: Anesthesiology

## 2015-04-15 ENCOUNTER — Ambulatory Visit
Admission: RE | Admit: 2015-04-15 | Discharge: 2015-04-15 | Disposition: A | Discharge status: Home / Self Care | Payer: Medicare Other | Source: Ambulatory Visit | Attending: Gastroenterology | Admitting: Gastroenterology

## 2015-04-15 DIAGNOSIS — K219 Gastro-esophageal reflux disease without esophagitis: Secondary | ICD-10-CM | POA: Insufficient documentation

## 2015-04-15 DIAGNOSIS — Z87891 Personal history of nicotine dependence: Secondary | ICD-10-CM | POA: Diagnosis not present

## 2015-04-15 DIAGNOSIS — Z882 Allergy status to sulfonamides status: Secondary | ICD-10-CM | POA: Diagnosis not present

## 2015-04-15 DIAGNOSIS — Z8261 Family history of arthritis: Secondary | ICD-10-CM | POA: Diagnosis not present

## 2015-04-15 DIAGNOSIS — Z1211 Encounter for screening for malignant neoplasm of colon: Secondary | ICD-10-CM | POA: Diagnosis not present

## 2015-04-15 DIAGNOSIS — Z79899 Other long term (current) drug therapy: Secondary | ICD-10-CM | POA: Insufficient documentation

## 2015-04-15 DIAGNOSIS — E039 Hypothyroidism, unspecified: Secondary | ICD-10-CM | POA: Diagnosis not present

## 2015-04-15 DIAGNOSIS — Z8249 Family history of ischemic heart disease and other diseases of the circulatory system: Secondary | ICD-10-CM | POA: Diagnosis not present

## 2015-04-15 DIAGNOSIS — Z8371 Family history of colonic polyps: Secondary | ICD-10-CM | POA: Diagnosis present

## 2015-04-15 DIAGNOSIS — E119 Type 2 diabetes mellitus without complications: Secondary | ICD-10-CM | POA: Diagnosis not present

## 2015-04-15 DIAGNOSIS — I10 Essential (primary) hypertension: Secondary | ICD-10-CM | POA: Diagnosis not present

## 2015-04-15 DIAGNOSIS — E785 Hyperlipidemia, unspecified: Secondary | ICD-10-CM | POA: Diagnosis not present

## 2015-04-15 DIAGNOSIS — Z833 Family history of diabetes mellitus: Secondary | ICD-10-CM | POA: Diagnosis not present

## 2015-04-15 HISTORY — PX: ESOPHAGOGASTRODUODENOSCOPY: SHX5428

## 2015-04-15 HISTORY — PX: COLONOSCOPY: SHX5424

## 2015-04-15 HISTORY — DX: Essential (primary) hypertension: I10

## 2015-04-15 LAB — HM COLONOSCOPY: HM Colonoscopy: NORMAL

## 2015-04-15 SURGERY — COLONOSCOPY
Anesthesia: General

## 2015-04-15 MED ORDER — SODIUM CHLORIDE 0.9 % IV SOLN
INTRAVENOUS | Status: DC
  Administered 2015-04-15: 1000 mL via INTRAVENOUS

## 2015-04-15 MED ORDER — MIDAZOLAM HCL 2 MG/2ML IJ SOLN
INTRAMUSCULAR | Status: DC | PRN
  Administered 2015-04-15: 2 mg via INTRAVENOUS

## 2015-04-15 MED ORDER — PROPOFOL INFUSION 10 MG/ML OPTIME
INTRAVENOUS | Status: DC | PRN
  Administered 2015-04-15: 140 ug/kg/min via INTRAVENOUS

## 2015-04-15 MED ORDER — LIDOCAINE HCL (CARDIAC) 20 MG/ML IV SOLN
INTRAVENOUS | Status: DC | PRN
  Administered 2015-04-15: 60 mg via INTRAVENOUS

## 2015-04-15 MED ORDER — SODIUM CHLORIDE 0.9 % IV SOLN: INTRAVENOUS | Status: DC

## 2015-04-15 NOTE — H&P (Signed)
    Primary Care Physician:  Einar Pheasant, MD Primary Gastroenterologist:  Dr. Candace Cruise  Pre-Procedure History & Physical: HPI:  Mariah Moody is a 74 y.o. female is here for colonoscopy.   Past Medical History  Diagnosis Date  . Hypothyroidism   . Diabetes mellitus without complication   . History of chicken pox   . GERD (gastroesophageal reflux disease)   . Hyperlipidemia   . Hypertension     Past Surgical History  Procedure Laterality Date  . Tubal ligation      Prior to Admission medications   Medication Sig Start Date End Date Taking? Authorizing Provider  Calcium Carbonate-Vitamin D (CALCIUM + D PO) Take 1 tablet by mouth daily.   Yes Historical Provider, MD  Cholecalciferol (VITAMIN D PO) Take 1 capsule by mouth daily.    Yes Historical Provider, MD  estradiol-norethindrone [facility]) 0.05-0.25 MG/DAY Place 1 patch onto the skin 2 (two) times a week. 03/07/15  Yes Einar Pheasant, MD  hydrochlorothiazide (MICROZIDE) 12.5 MG capsule Take 1 capsule (12.5 mg total) by mouth daily. 02/28/15  Yes Einar Pheasant, MD  levothyroxine (SYNTHROID, LEVOTHROID) 50 MCG tablet Take 1 tablet (50 mcg total) by mouth daily before breakfast. 02/28/15  Yes Einar Pheasant, MD  losartan-hydrochlorothiazide (HYZAAR) 50-12.5 MG per tablet Take 1 tablet by mouth daily. 02/28/15  Yes Einar Pheasant, MD  Multiple Vitamins-Minerals (MULTIVITAMIN PO) Take 1 tablet by mouth daily.    Yes Historical Provider, MD  pantoprazole (PROTONIX) 40 MG tablet Take 40 mg by mouth daily.   Yes Historical Provider, MD    Allergies as of 02/14/2015 - Review Complete 01/13/2015  Allergen Reaction Noted  . Sulfa antibiotics  08/06/2014    Family History  Problem Relation Age of Onset  . Arthritis Mother   . Hypertension Mother   . Heart disease Father   . Hypertension Father   . Diabetes Brother   . Colon polyps Mother     History   Social History  . Marital Status: Married    Spouse Name: N/A  .  Number of Children: N/A  . Years of Education: N/A   Occupational History  . Not on file.   Social History Main Topics  . Smoking status: Former Research scientist (life sciences)  . Smokeless tobacco: Never Used  . Alcohol Use: 0.0 oz/week    0 Standard drinks or equivalent per week  . Drug Use: No  . Sexual Activity: Not on file   Other Topics Concern  . Not on file   Social History Narrative    Review of Systems: See HPI, otherwise negative ROS  Physical Exam: BP 140/64 mmHg  Pulse 73  Temp(Src) 97.4 F (36.3 C) (Tympanic)  Resp 17  Ht 5\' 2"  (1.575 m)  Wt 62.596 kg (138 lb)  BMI 25.23 kg/m2  SpO2 99% General:   Alert,  pleasant and cooperative in NAD Head:  Normocephalic and atraumatic. Neck:  Supple; no masses or thyromegaly. Lungs:  Clear throughout to auscultation.    Heart:  Regular rate and rhythm. Abdomen:  Soft, nontender and nondistended. Normal bowel sounds, without guarding, and without rebound.   Neurologic:  Alert and  oriented x4;  grossly normal neurologically.  Impression/Plan: Mariah Moody is here for a colonscopy to be performed for family hx of colon polyps. Risks, benefits, limitations, and alternatives regarding colonoscopy have been reviewed with the patient.  Questions have been answered.  All parties agreeable.   Nickisha Hum, Lupita Dawn, MD  04/15/2015, 7:53 AM

## 2015-04-15 NOTE — Anesthesia Preprocedure Evaluation (Signed)
Anesthesia Evaluation  Patient identified by MRN, date of birth, ID band Patient awake    Reviewed: Allergy & Precautions, H&P , NPO status , Patient's Chart, lab work & pertinent test results, reviewed documented beta blocker date and time   Airway Mallampati: II  TM Distance: >3 FB Neck ROM: full    Dental no notable dental hx.    Pulmonary neg pulmonary ROS, former smoker,  breath sounds clear to auscultation  Pulmonary exam normal       Cardiovascular Exercise Tolerance: Good hypertension, negative cardio ROS  Rhythm:regular Rate:Normal     Neuro/Psych negative neurological ROS  negative psych ROS   GI/Hepatic negative GI ROS, Neg liver ROS,   Endo/Other  negative endocrine ROSdiabetes  Renal/GU negative Renal ROS  negative genitourinary   Musculoskeletal   Abdominal   Peds  Hematology negative hematology ROS (+)   Anesthesia Other Findings   Reproductive/Obstetrics negative OB ROS                             Anesthesia Physical Anesthesia Plan  ASA: II  Anesthesia Plan: General   Post-op Pain Management:    Induction:   Airway Management Planned:   Additional Equipment:   Intra-op Plan:   Post-operative Plan:   Informed Consent: I have reviewed the patients History and Physical, chart, labs and discussed the procedure including the risks, benefits and alternatives for the proposed anesthesia with the patient or authorized representative who has indicated his/her understanding and acceptance.   Dental Advisory Given  Plan Discussed with: CRNA  Anesthesia Plan Comments:         Anesthesia Quick Evaluation

## 2015-04-15 NOTE — Anesthesia Postprocedure Evaluation (Signed)
  Anesthesia Post-op Note  Patient: Mariah Moody  Procedure(s) Performed: Procedure(s): COLONOSCOPY (N/A) ESOPHAGOGASTRODUODENOSCOPY (EGD) (N/A)  Anesthesia type:General  Patient location: PACU  Post pain: Pain level controlled  Post assessment: Post-op Vital signs reviewed, Patient's Cardiovascular Status Stable, Respiratory Function Stable, Patent Airway and No signs of Nausea or vomiting  Post vital signs: Reviewed and stable  Last Vitals:  Filed Vitals:   04/15/15 0852  BP: 145/73  Pulse: 78  Temp:   Resp: 15    Level of consciousness: awake, alert  and patient cooperative  Complications: No apparent anesthesia complications

## 2015-04-15 NOTE — Op Note (Signed)
[facility] Patient Name: Mariah Moody Procedure Date: 04/15/2015 7:40 AM MRN: [id number] Account #: [id number] Date of Birth: [birth year] Admit Type: Outpatient Age: 74 Room: [facility] ENDO ROOM 4 Gender: Female Note Status: Finalized Procedure:         Colonoscopy Indications:       Family history of colonic polyps in a first-degree relative Providers:         Lupita Dawn. Candace Cruise, MD Referring MD:      Einar Pheasant, MD (Referring MD) Medicines:         Monitored Anesthesia Care Complications:     No immediate complications. Procedure:         Pre-Anesthesia Assessment:                    - Prior to the procedure, a History and Physical was                     performed, and patient medications, allergies and                     sensitivities were reviewed. The patient's tolerance of                     previous anesthesia was reviewed.                    - The risks and benefits of the procedure and the sedation                     options and risks were discussed with the patient. All                     questions were answered and informed consent was obtained.                    - After reviewing the risks and benefits, the patient was                     deemed in satisfactory condition to undergo the procedure.                    After obtaining informed consent, the colonoscope was                     passed under direct vision. Throughout the procedure, the                     patient's blood pressure, pulse, and oxygen saturations                     were monitored continuously. The Colonoscope was                     introduced through the anus and advanced to the the cecum,                     identified by appendiceal orifice and ileocecal valve. The                     colonoscopy was performed without difficulty. The patient                     tolerated the procedure well. The quality of the bowel  preparation was  good. Findings:      The colon (entire examined portion) appeared normal. Impression:        - The entire examined colon is normal.                    - No specimens collected. Recommendation:    - Discharge patient to home.                    - The findings and recommendations were discussed with the                     patient. Procedure Code(s): --- Professional ---                    [phone], Colonoscopy, flexible; diagnostic, including                     collection of specimen(s) by brushing or washing, when                     performed (separate procedure) Diagnosis Code(s): --- Professional ---                    Z83.71, Family history of colonic polyps CPT copyright 2014 American Medical Association. All rights reserved. The codes documented in this report are preliminary and upon coder review may  be revised to meet current compliance requirements. Hulen Luster, MD 04/15/2015 8:29:49 AM This report has been signed electronically. Number of Addenda: 0 Note Initiated On: 04/15/2015 7:40 AM Scope Withdrawal Time: 0 hours 6 minutes 57 seconds  Total Procedure Duration: 0 hours 12 minutes 0 seconds       [facility]

## 2015-04-15 NOTE — Transfer of Care (Signed)
Immediate Anesthesia Transfer of Care Note  Patient: Mariah Moody  Procedure(s) Performed: Procedure(s): COLONOSCOPY (N/A) ESOPHAGOGASTRODUODENOSCOPY (EGD) (N/A)  Patient Location: PACU and Endoscopy Unit  Anesthesia Type:General  Level of Consciousness: sedated  Airway & Oxygen Therapy: Patient Spontanous Breathing and Patient connected to face mask oxygen  Post-op Assessment: Report given to RN and Post -op Vital signs reviewed and stable  Post vital signs: Reviewed and stable  Last Vitals:  Filed Vitals:   04/15/15 0832  BP: 109/45  Pulse: 69  Temp:   Resp: 14    Complications: No apparent anesthesia complications

## 2015-04-23 ENCOUNTER — Encounter: Payer: Self-pay | Admitting: *Deleted

## 2015-05-06 ENCOUNTER — Encounter: Payer: Self-pay | Admitting: Internal Medicine

## 2015-05-09 ENCOUNTER — Ambulatory Visit (INDEPENDENT_AMBULATORY_CARE_PROVIDER_SITE_OTHER): Payer: Medicare Other | Admitting: Internal Medicine

## 2015-05-09 ENCOUNTER — Encounter: Payer: Self-pay | Admitting: Internal Medicine

## 2015-05-09 VITALS — BP 138/80 | HR 77 | Temp 97.9°F | Ht 62.75 in | Wt 142.4 lb

## 2015-05-09 DIAGNOSIS — N951 Menopausal and female climacteric states: Secondary | ICD-10-CM

## 2015-05-09 DIAGNOSIS — Z0000 Encounter for general adult medical examination without abnormal findings: Secondary | ICD-10-CM

## 2015-05-09 DIAGNOSIS — E119 Type 2 diabetes mellitus without complications: Secondary | ICD-10-CM | POA: Diagnosis not present

## 2015-05-09 DIAGNOSIS — E038 Other specified hypothyroidism: Secondary | ICD-10-CM

## 2015-05-09 DIAGNOSIS — R5383 Other fatigue: Secondary | ICD-10-CM

## 2015-05-09 DIAGNOSIS — R232 Flushing: Secondary | ICD-10-CM

## 2015-05-09 DIAGNOSIS — Z8371 Family history of colonic polyps: Secondary | ICD-10-CM

## 2015-05-09 DIAGNOSIS — E7800 Pure hypercholesterolemia, unspecified: Secondary | ICD-10-CM

## 2015-05-09 DIAGNOSIS — Z83719 Family history of colon polyps, unspecified: Secondary | ICD-10-CM

## 2015-05-09 DIAGNOSIS — I10 Essential (primary) hypertension: Secondary | ICD-10-CM

## 2015-05-09 DIAGNOSIS — K219 Gastro-esophageal reflux disease without esophagitis: Secondary | ICD-10-CM

## 2015-05-09 DIAGNOSIS — D259 Leiomyoma of uterus, unspecified: Secondary | ICD-10-CM

## 2015-05-09 LAB — COMPREHENSIVE METABOLIC PANEL
ALBUMIN: 4.1 g/dL (ref 3.5–5.2)
ALT: 17 U/L (ref 0–35)
AST: 16 U/L (ref 0–37)
Alkaline Phosphatase: 69 U/L (ref 39–117)
BILIRUBIN TOTAL: 1 mg/dL (ref 0.2–1.2)
BUN: 21 mg/dL (ref 6–23)
CO2: 30 meq/L (ref 19–32)
CREATININE: 0.72 mg/dL (ref 0.40–1.20)
Calcium: 9.9 mg/dL (ref 8.4–10.5)
Chloride: 102 mEq/L (ref 96–112)
GFR: 84.59 mL/min (ref >60.00)
GLUCOSE: 144 mg/dL — AB (ref 70–99)
Potassium: 4.2 mEq/L (ref 3.5–5.1)
Sodium: 139 mEq/L (ref 135–145)
Total Protein: 7 g/dL (ref 6.0–8.3)

## 2015-05-09 LAB — LIPID PANEL
CHOLESTEROL: 217 mg/dL — AB (ref 0–200)
HDL: 35.5 mg/dL — ABNORMAL LOW (ref >39.00)
NonHDL: 181.5
TRIGLYCERIDES: 223 mg/dL — AB (ref 0.0–149.0)
Total CHOL/HDL Ratio: 6
VLDL: 44.6 mg/dL — ABNORMAL HIGH (ref 0.0–40.0)

## 2015-05-09 LAB — CBC WITH DIFFERENTIAL/PLATELET
Basophils Absolute: 0 10*3/uL (ref 0.0–0.1)
Basophils Relative: 0.5 % (ref 0.0–3.0)
EOS ABS: 0.1 10*3/uL (ref 0.0–0.7)
Eosinophils Relative: 2.1 % (ref 0.0–5.0)
HEMATOCRIT: 39.3 % (ref 36.0–46.0)
HEMOGLOBIN: 13.5 g/dL (ref 12.0–15.0)
Lymphocytes Relative: 33.2 % (ref 12.0–46.0)
Lymphs Abs: 1.8 10*3/uL (ref 0.7–4.0)
MCHC: 34.4 g/dL (ref 30.0–36.0)
MCV: 90.2 fl (ref 78.0–100.0)
Monocytes Absolute: 0.5 10*3/uL (ref 0.1–1.0)
Monocytes Relative: 8.3 % (ref 3.0–12.0)
NEUTROS PCT: 55.9 % (ref 43.0–77.0)
Neutro Abs: 3.1 10*3/uL (ref 1.4–7.7)
Platelets: 259 10*3/uL (ref 150.0–400.0)
RBC: 4.36 Mil/uL (ref 3.87–5.11)
RDW: 12.2 % (ref 11.5–15.5)
WBC: 5.6 10*3/uL (ref 4.0–10.5)

## 2015-05-09 LAB — HEMOGLOBIN A1C: HEMOGLOBIN A1C: 6.6 % — AB (ref 4.6–6.5)

## 2015-05-09 LAB — LDL CHOLESTEROL, DIRECT: Direct LDL: 127 mg/dL

## 2015-05-09 LAB — TSH: TSH: 6.61 u[IU]/mL — ABNORMAL HIGH (ref 0.35–4.50)

## 2015-05-09 NOTE — Progress Notes (Signed)
Pre visit review using our clinic review tool, if applicable. No additional management support is needed unless otherwise documented below in the visit note. 

## 2015-05-09 NOTE — Progress Notes (Signed)
Patient ID: Mariah Moody, female   DOB: [birth year], 74 y.o.   MRN: [id number]   Subjective:    Patient ID: Mariah Moody, female    DOB: [birth year], 74 y.o.   MRN: [id number]  HPI  Patient here for a scheduled follow up.  Reports some hot flashes and fatigue.  Wakes up some at night.  (maybe 1-2x/night).  Is trying to stay active.  No cardiac symptoms with increased activity or exertion.  No sob.  Eating and drinking well.  No bowel change.  Blood pressure on outside checks - 865-784 systolic range.     Past Medical History  Diagnosis Date  . Hypothyroidism   . Diabetes mellitus without complication   . History of chicken pox   . GERD (gastroesophageal reflux disease)   . Hyperlipidemia   . Hypertension     Outpatient Encounter Prescriptions as of 05/09/2015  Medication Sig  . Calcium Carbonate-Vitamin D (CALCIUM + D PO) Take 1 tablet by mouth daily.  . Cholecalciferol (VITAMIN D PO) Take 1 capsule by mouth daily.   Marland Kitchen estradiol-norethindrone (COMBIPATCH) 0.05-0.25 MG/DAY Place 1 patch onto the skin 2 (two) times a week.  . hydrochlorothiazide (MICROZIDE) 12.5 MG capsule Take 1 capsule (12.5 mg total) by mouth daily.  Marland Kitchen levothyroxine (SYNTHROID, LEVOTHROID) 50 MCG tablet Take 1 tablet (50 mcg total) by mouth daily before breakfast.  . losartan-hydrochlorothiazide (HYZAAR) 50-12.5 MG per tablet Take 1 tablet by mouth daily.  . Multiple Vitamins-Minerals (MULTIVITAMIN PO) Take 1 tablet by mouth daily.   . pantoprazole (PROTONIX) 40 MG tablet Take 40 mg by mouth daily.   No facility-administered encounter medications on file as of 05/09/2015.    Review of Systems  Constitutional: Positive for fatigue. Negative for appetite change and unexpected weight change.  HENT: Negative for congestion and sinus pressure.   Respiratory: Negative for cough, chest tightness and shortness of breath.   Cardiovascular: Negative for chest pain, palpitations and leg swelling.  Gastrointestinal:  Negative for nausea, vomiting, abdominal pain and diarrhea.  Genitourinary: Negative for dysuria and difficulty urinating.  Skin: Negative for color change and rash.  Neurological: Negative for dizziness, light-headedness and headaches.  Hematological: Negative for adenopathy. Does not bruise/bleed easily.  Psychiatric/Behavioral: Negative for dysphoric mood and agitation.       Objective:     Blood pressure recheck:  144/78  Physical Exam  Constitutional: She appears well-developed and well-nourished. No distress.  HENT:  Nose: Nose normal.  Mouth/Throat: Oropharynx is clear and moist.  Neck: Neck supple. No thyromegaly present.  Cardiovascular: Normal rate and regular rhythm.   Pulmonary/Chest: Breath sounds normal. No respiratory distress. She has no wheezes.  Abdominal: Soft. Bowel sounds are normal. There is no tenderness.  Musculoskeletal: She exhibits no edema or tenderness.  Lymphadenopathy:    She has no cervical adenopathy.  Skin: No rash noted. No erythema.  Psychiatric: She has a normal mood and affect. Her behavior is normal.    BP 138/80 mmHg  Pulse 77  Temp(Src) 97.9 F (36.6 C) (Oral)  Ht 5' 2.75" (1.594 m)  Wt 142 lb 6 oz (64.581 kg)  BMI 25.42 kg/m2  SpO2 95% Wt Readings from Last 3 Encounters:  05/09/15 142 lb 6 oz (64.581 kg)  04/15/15 138 lb (62.596 kg)  01/08/15 140 lb 2 oz (63.56 kg)     Lab Results  Component Value Date   WBC 5.6 05/09/2015   HGB 13.5 05/09/2015   HCT 39.3 05/09/2015   PLT  259.0 05/09/2015   GLUCOSE 144* 05/09/2015   CHOL 217* 05/09/2015   TRIG 223.0* 05/09/2015   HDL 35.50* 05/09/2015   LDLDIRECT 127.0 05/09/2015   ALT 17 05/09/2015   AST 16 05/09/2015   NA 139 05/09/2015   K 4.2 05/09/2015   CL 102 05/09/2015   CREATININE 0.72 05/09/2015   BUN 21 05/09/2015   CO2 30 05/09/2015   TSH 6.61* 05/09/2015   HGBA1C 6.6* 05/09/2015   MICROALBUR 0.7 11/05/2014       Assessment & Plan:   Problem List Items  Addressed This Visit    Diabetes mellitus type II, controlled, with no complications    Does not check her sugars.  Low carb diet and exercise.  Follow met b and a1c.       Essential hypertension, benign    Blood pressure as outlined.  Have her spot check her pressure.  Continue same medication for now.  Follow.  Get her back in soon to reassess.        Family history of colonic polyps    Colonoscopy 04/15/15 - entire colon normal.       Fatigue - Primary    Check cbc, met b, liver panel and tsh.        Relevant Orders   CBC with Differential/Platelet (Completed)   TSH (Completed)   Fibroid, uterine    Saw Dr Enzo Bi.  Pelvic ultrasound - fibroids.       GERD (gastroesophageal reflux disease)    Controlled on protonix.       Health care maintenance    Physical 11/05/14.  Colonoscopy 04/15/15 - normal.  Need mammogram results.        Hot flashes   Hypercholesterolemia    Low cholesterol diet and exercise.  Follow lipid panel.        Hypothyroidism    On thyroid replacement.  Follow tsh.         I spent 25 minutes with the patient and more than 50% of the time was spent in consultation regarding the above.     Einar Pheasant, MD

## 2015-05-10 ENCOUNTER — Encounter: Payer: Self-pay | Admitting: Internal Medicine

## 2015-05-10 DIAGNOSIS — R232 Flushing: Secondary | ICD-10-CM | POA: Insufficient documentation

## 2015-05-10 DIAGNOSIS — R5383 Other fatigue: Secondary | ICD-10-CM | POA: Insufficient documentation

## 2015-05-10 NOTE — Assessment & Plan Note (Signed)
Saw Dr Enzo Bi.  Pelvic ultrasound - fibroids.

## 2015-05-10 NOTE — Assessment & Plan Note (Signed)
Colonoscopy 04/15/15 - entire colon normal.

## 2015-05-10 NOTE — Assessment & Plan Note (Signed)
Controlled on protonix.   

## 2015-05-10 NOTE — Assessment & Plan Note (Signed)
Physical 11/05/14.  Colonoscopy 04/15/15 - normal.  Need mammogram results.

## 2015-05-10 NOTE — Assessment & Plan Note (Signed)
On thyroid replacement.  Follow tsh.  

## 2015-05-10 NOTE — Assessment & Plan Note (Signed)
Blood pressure as outlined.  Have her spot check her pressure.  Continue same medication for now.  Follow.  Get her back in soon to reassess.

## 2015-05-10 NOTE — Assessment & Plan Note (Signed)
Low cholesterol diet and exercise.  Follow lipid panel.   

## 2015-05-10 NOTE — Assessment & Plan Note (Signed)
Check cbc, met b, liver panel and tsh.

## 2015-05-10 NOTE — Assessment & Plan Note (Signed)
Does not check her sugars.  Low carb diet and exercise.  Follow met b and a1c.

## 2015-05-13 ENCOUNTER — Other Ambulatory Visit: Payer: Self-pay | Admitting: *Deleted

## 2015-05-13 ENCOUNTER — Telehealth: Payer: Self-pay | Admitting: *Deleted

## 2015-05-13 MED ORDER — LEVOTHYROXINE SODIUM 75 MCG PO TABS
75.0000 ug | ORAL_TABLET | Freq: Every day | ORAL | Status: DC
Start: 2015-05-13 — End: 2015-09-29

## 2015-05-13 NOTE — Telephone Encounter (Signed)
Spoke with pt, advised of result note.  Pt states she is taking Synthroid 50 mcg as prescribed.  I advised of Rx increase.  New Rx sent to pharmacy.  Pt verbalized understanding

## 2015-05-15 ENCOUNTER — Encounter: Payer: Self-pay | Admitting: *Deleted

## 2015-05-23 NOTE — Telephone Encounter (Signed)
Unread mychart message mailed to patient 

## 2015-05-27 ENCOUNTER — Encounter: Payer: Self-pay | Admitting: *Deleted

## 2015-06-06 ENCOUNTER — Encounter: Payer: Self-pay | Admitting: Gastroenterology

## 2015-07-30 ENCOUNTER — Encounter: Payer: Self-pay | Admitting: Internal Medicine

## 2015-07-30 ENCOUNTER — Ambulatory Visit (INDEPENDENT_AMBULATORY_CARE_PROVIDER_SITE_OTHER): Payer: Medicare Other | Admitting: Internal Medicine

## 2015-07-30 VITALS — BP 120/80 | HR 73 | Temp 98.3°F | Resp 18 | Ht 62.75 in | Wt 141.1 lb

## 2015-07-30 DIAGNOSIS — K219 Gastro-esophageal reflux disease without esophagitis: Secondary | ICD-10-CM | POA: Diagnosis not present

## 2015-07-30 DIAGNOSIS — E7800 Pure hypercholesterolemia, unspecified: Secondary | ICD-10-CM

## 2015-07-30 DIAGNOSIS — Z1239 Encounter for other screening for malignant neoplasm of breast: Secondary | ICD-10-CM

## 2015-07-30 DIAGNOSIS — Z8371 Family history of colonic polyps: Secondary | ICD-10-CM

## 2015-07-30 DIAGNOSIS — Z83719 Family history of colon polyps, unspecified: Secondary | ICD-10-CM

## 2015-07-30 DIAGNOSIS — I10 Essential (primary) hypertension: Secondary | ICD-10-CM

## 2015-07-30 DIAGNOSIS — Z23 Encounter for immunization: Secondary | ICD-10-CM

## 2015-07-30 DIAGNOSIS — E119 Type 2 diabetes mellitus without complications: Secondary | ICD-10-CM

## 2015-07-30 DIAGNOSIS — E039 Hypothyroidism, unspecified: Secondary | ICD-10-CM | POA: Diagnosis not present

## 2015-07-30 LAB — TSH: TSH: 3.63 u[IU]/mL (ref 0.35–4.50)

## 2015-07-30 NOTE — Assessment & Plan Note (Signed)
Last a1c improved 6.6.  Low carb diet and exercise.  Follow met b and a1c.  Had eyes checked last month.  Ok.   

## 2015-07-30 NOTE — Patient Instructions (Signed)

## 2015-07-30 NOTE — Progress Notes (Signed)
Pre-visit discussion using our clinic review tool. No additional management support is needed unless otherwise documented below in the visit note.  

## 2015-07-30 NOTE — Progress Notes (Signed)
Patient ID: Mariah Moody, female   DOB: 07/07/1941, 74 y.o.   MRN: [id number]   Subjective:    Patient ID: Mariah Moody, female    DOB: May 27, 1941, 74 y.o.   MRN: [id number]  HPI  Patient with past history of diabetes, hypertension and hypercholesterolemia who comes in today to follow up on these issues.  She is staying active.  Last a1c and triglycerides improved.  Discussed continuing diet adjustment and exercise.  No cardiac symptoms with increased activity or exertion.  No sob.  States feels better since synthroid dose adjusted.  Did notice her hair started falling out.  Has leveled off now.  No abdominal pain or cramping.  Bowels stable.     Past Medical History  Diagnosis Date  . Hypothyroidism   . Diabetes mellitus without complication   . History of chicken pox   . GERD (gastroesophageal reflux disease)   . Hyperlipidemia   . Hypertension    Past Surgical History  Procedure Laterality Date  . Tubal ligation    . Colonoscopy N/A 04/15/2015    Procedure: COLONOSCOPY;  Surgeon: Hulen Luster, MD;  Location: [facility] ENDOSCOPY;  Service: Gastroenterology;  Laterality: N/A;  . Esophagogastroduodenoscopy N/A 04/15/2015    Procedure: ESOPHAGOGASTRODUODENOSCOPY (EGD);  Surgeon: Hulen Luster, MD;  Location: [facility] ENDOSCOPY;  Service: Gastroenterology;  Laterality: N/A;   Family History  Problem Relation Age of Onset  . Arthritis Mother   . Hypertension Mother   . Heart disease Father   . Hypertension Father   . Diabetes Brother   . Colon polyps Mother    Social History   Social History  . Marital Status: Married    Spouse Name: N/A  . Number of Children: N/A  . Years of Education: N/A   Social History Main Topics  . Smoking status: Former Research scientist (life sciences)  . Smokeless tobacco: Never Used  . Alcohol Use: 0.0 oz/week    0 Standard drinks or equivalent per week  . Drug Use: No  . Sexual Activity: Not Asked   Other Topics Concern  . None   Social History Narrative     Outpatient Encounter Prescriptions as of 07/30/2015  Medication Sig  . Calcium Carbonate-Vitamin D (CALCIUM + D PO) Take 1 tablet by mouth daily.  . Cholecalciferol (VITAMIN D PO) Take 1 capsule by mouth daily.   Marland Kitchen estradiol-norethindrone (COMBIPATCH) 0.05-0.25 MG/DAY Place 1 patch onto the skin 2 (two) times a week.  . hydrochlorothiazide (MICROZIDE) 12.5 MG capsule Take 1 capsule (12.5 mg total) by mouth daily.  Marland Kitchen levothyroxine (SYNTHROID, LEVOTHROID) 75 MCG tablet Take 1 tablet (75 mcg total) by mouth daily before breakfast.  . losartan-hydrochlorothiazide (HYZAAR) 50-12.5 MG per tablet Take 1 tablet by mouth daily.  . Multiple Vitamins-Minerals (MULTIVITAMIN PO) Take 1 tablet by mouth daily.   . pantoprazole (PROTONIX) 40 MG tablet Take 40 mg by mouth daily.   No facility-administered encounter medications on file as of 07/30/2015.    Outpatient Encounter Prescriptions as of 07/30/2015  Medication Sig  . Calcium Carbonate-Vitamin D (CALCIUM + D PO) Take 1 tablet by mouth daily.  . Cholecalciferol (VITAMIN D PO) Take 1 capsule by mouth daily.   Marland Kitchen estradiol-norethindrone (COMBIPATCH) 0.05-0.25 MG/DAY Place 1 patch onto the skin 2 (two) times a week.  . hydrochlorothiazide (MICROZIDE) 12.5 MG capsule Take 1 capsule (12.5 mg total) by mouth daily.  Marland Kitchen levothyroxine (SYNTHROID, LEVOTHROID) 75 MCG tablet Take 1 tablet (75 mcg total) by mouth daily before breakfast.  .  losartan-hydrochlorothiazide (HYZAAR) 50-12.5 MG per tablet Take 1 tablet by mouth daily.  . Multiple Vitamins-Minerals (MULTIVITAMIN PO) Take 1 tablet by mouth daily.   . pantoprazole (PROTONIX) 40 MG tablet Take 40 mg by mouth daily.   No facility-administered encounter medications on file as of 07/30/2015.    Review of Systems  Constitutional: Negative for appetite change and unexpected weight change.  HENT: Negative for congestion and sinus pressure.   Eyes: Negative for pain and visual disturbance.  Respiratory:  Negative for cough, chest tightness and shortness of breath.   Cardiovascular: Negative for chest pain, palpitations and leg swelling.  Gastrointestinal: Negative for nausea, vomiting, abdominal pain and diarrhea.  Genitourinary: Negative for dysuria and difficulty urinating.  Musculoskeletal: Negative for back pain and joint swelling.  Skin: Negative for color change and rash.  Neurological: Negative for dizziness, light-headedness and headaches.  Psychiatric/Behavioral: Negative for dysphoric mood and agitation.       Objective:     Blood pressure rechecked by me;  138-140/78-80  Physical Exam  Constitutional: She appears well-developed and well-nourished. No distress.  HENT:  Nose: Nose normal.  Mouth/Throat: Oropharynx is clear and moist.  Eyes: Conjunctivae are normal. Right eye exhibits no discharge. Left eye exhibits no discharge.  Neck: Neck supple. No thyromegaly present.  Cardiovascular: Normal rate and regular rhythm.   Pulmonary/Chest: Breath sounds normal. No respiratory distress. She has no wheezes.  Abdominal: Soft. Bowel sounds are normal. There is no tenderness.  Musculoskeletal: She exhibits no edema or tenderness.  Lymphadenopathy:    She has no cervical adenopathy.  Skin: No rash noted. No erythema.  Psychiatric: She has a normal mood and affect. Her behavior is normal.    BP 120/80 mmHg  Pulse 73  Temp(Src) 98.3 F (36.8 C) (Oral)  Resp 18  Ht 5' 2.75" (1.594 m)  Wt 141 lb 2 oz (64.014 kg)  BMI 25.19 kg/m2  SpO2 95% Wt Readings from Last 3 Encounters:  07/30/15 141 lb 2 oz (64.014 kg)  05/09/15 142 lb 6 oz (64.581 kg)  04/15/15 138 lb (62.596 kg)     Lab Results  Component Value Date   WBC 5.6 05/09/2015   HGB 13.5 05/09/2015   HCT 39.3 05/09/2015   PLT 259.0 05/09/2015   GLUCOSE 144* 05/09/2015   CHOL 217* 05/09/2015   TRIG 223.0* 05/09/2015   HDL 35.50* 05/09/2015   LDLDIRECT 127.0 05/09/2015   ALT 17 05/09/2015   AST 16 05/09/2015    NA 139 05/09/2015   K 4.2 05/09/2015   CL 102 05/09/2015   CREATININE 0.72 05/09/2015   BUN 21 05/09/2015   CO2 30 05/09/2015   TSH 6.61* 05/09/2015   HGBA1C 6.6* 05/09/2015   MICROALBUR 0.7 11/05/2014       Assessment & Plan:   Problem List Items Addressed This Visit    Diabetes mellitus type II, controlled, with no complications    Last L4D improved 6.6.  Low carb diet and exercise.  Follow met b and a1c.  Had eyes checked last month.  Ok.        Essential hypertension, benign    Blood pressure on her outside checks - 030-131Y systolic.  Recheck by me slightly increased over prior checks.  Have her spot check her pressure.  Recent Cr wnl.  Get her back in soon to reassess.        Family history of colonic polyps    Colonoscopy 04/15/15 - entire colon normal.  GERD (gastroesophageal reflux disease)    On protonix.  No upper symptoms reported.        Hypercholesterolemia    Low cholesterol diet and exercise.  Triglycerides improved.  Has tried multiple statin medications and had intolerance.  Follow.  Diet and exercise.        Hypothyroidism - Primary    Just adjusted synthroid.  Recheck tsh today.  Hair loss has stabilized.        Relevant Orders   TSH    Other Visit Diagnoses    Screening breast examination        Relevant Orders    MM DIGITAL SCREENING BILATERAL        Einar Pheasant, MD

## 2015-07-30 NOTE — Assessment & Plan Note (Signed)
Low cholesterol diet and exercise.  Triglycerides improved.  Has tried multiple statin medications and had intolerance.  Follow.  Diet and exercise.

## 2015-07-30 NOTE — Assessment & Plan Note (Signed)
Colonoscopy 04/15/15 - entire colon normal.  

## 2015-07-30 NOTE — Assessment & Plan Note (Signed)
Just adjusted synthroid.  Recheck tsh today.  Hair loss has stabilized.

## 2015-07-30 NOTE — Assessment & Plan Note (Signed)
Blood pressure on her outside checks - 542-706C systolic.  Recheck by me slightly increased over prior checks.  Have her spot check her pressure.  Recent Cr wnl.  Get her back in soon to reassess.

## 2015-07-30 NOTE — Assessment & Plan Note (Signed)
On protonix.  No upper symptoms reported.   

## 2015-07-31 ENCOUNTER — Encounter: Payer: Self-pay | Admitting: Internal Medicine

## 2015-08-12 ENCOUNTER — Ambulatory Visit
Admission: RE | Admit: 2015-08-12 | Discharge: 2015-08-12 | Disposition: A | Discharge status: Home / Self Care | Payer: Medicare Other | Source: Ambulatory Visit | Attending: Internal Medicine | Admitting: Internal Medicine

## 2015-08-12 ENCOUNTER — Other Ambulatory Visit: Payer: Self-pay | Admitting: Internal Medicine

## 2015-08-12 DIAGNOSIS — Z1231 Encounter for screening mammogram for malignant neoplasm of breast: Secondary | ICD-10-CM | POA: Insufficient documentation

## 2015-08-12 DIAGNOSIS — Z1239 Encounter for other screening for malignant neoplasm of breast: Secondary | ICD-10-CM

## 2015-08-12 IMAGING — MG MM SCREENING BREAST TOMO BILATERAL
8 of 12 series · 8 of 28 positions shown · non-contrast
Comparison: Previous exam(s).

CLINICAL DATA: Screening.

EXAM:
DIGITAL SCREENING BILATERAL MAMMOGRAM WITH 3D TOMO WITH CAD

[L CC]
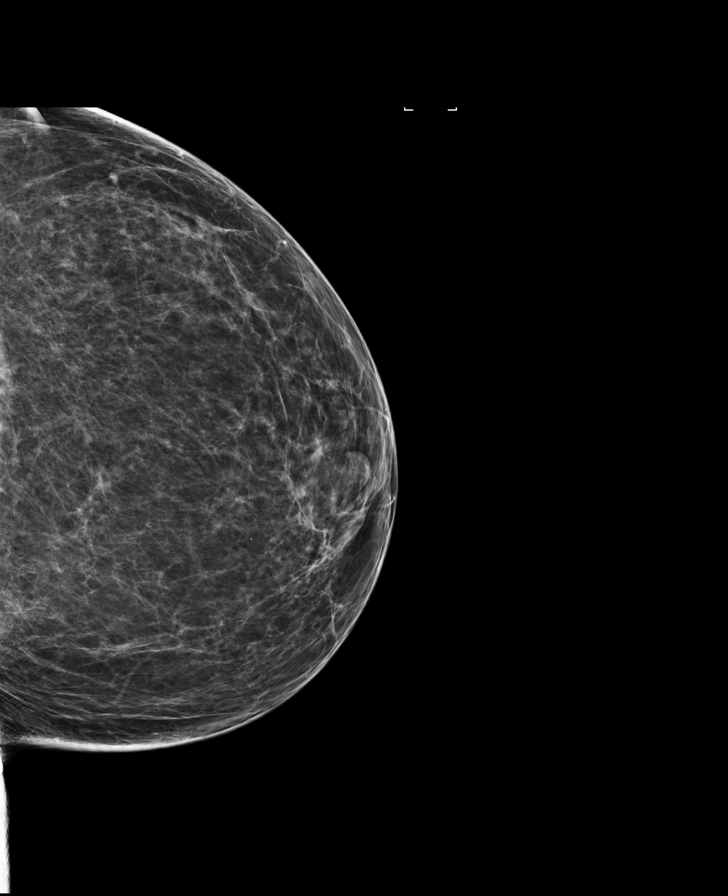

[R CC]
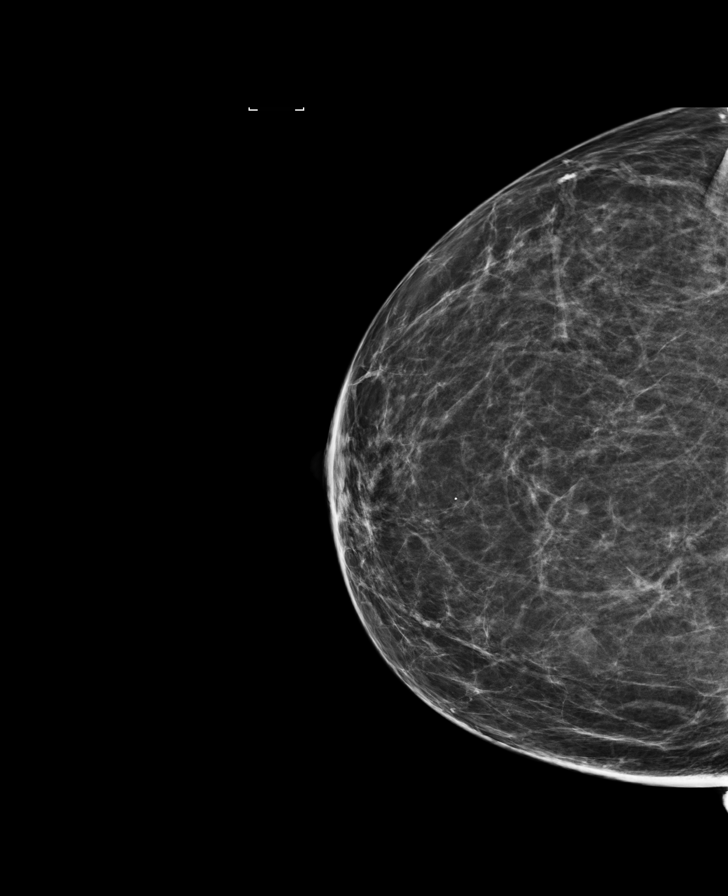

[R MLO synth-2D]
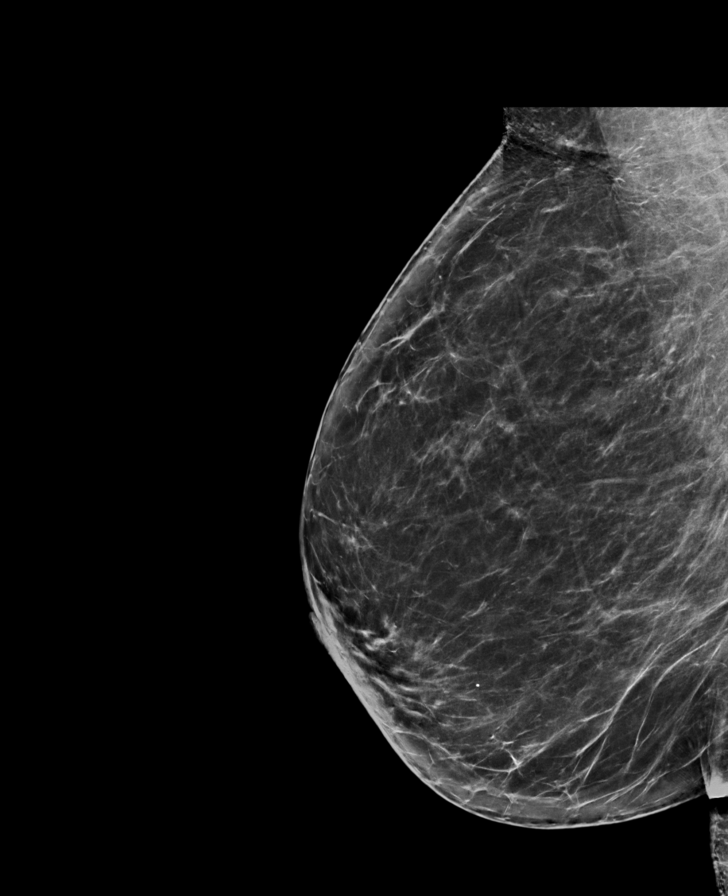

[L MLO synth-2D]
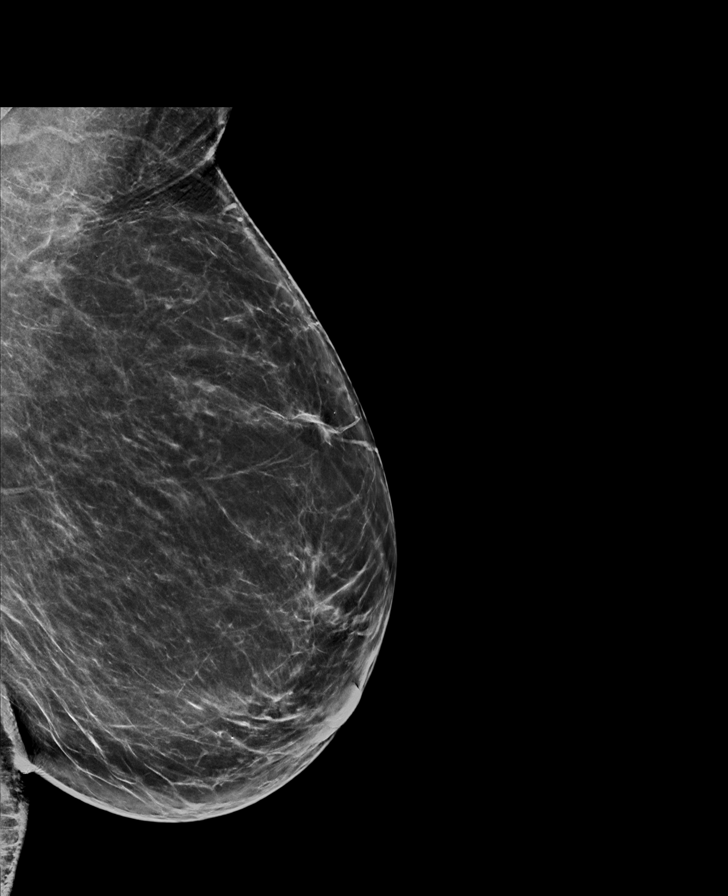

[R CC synth-2D]
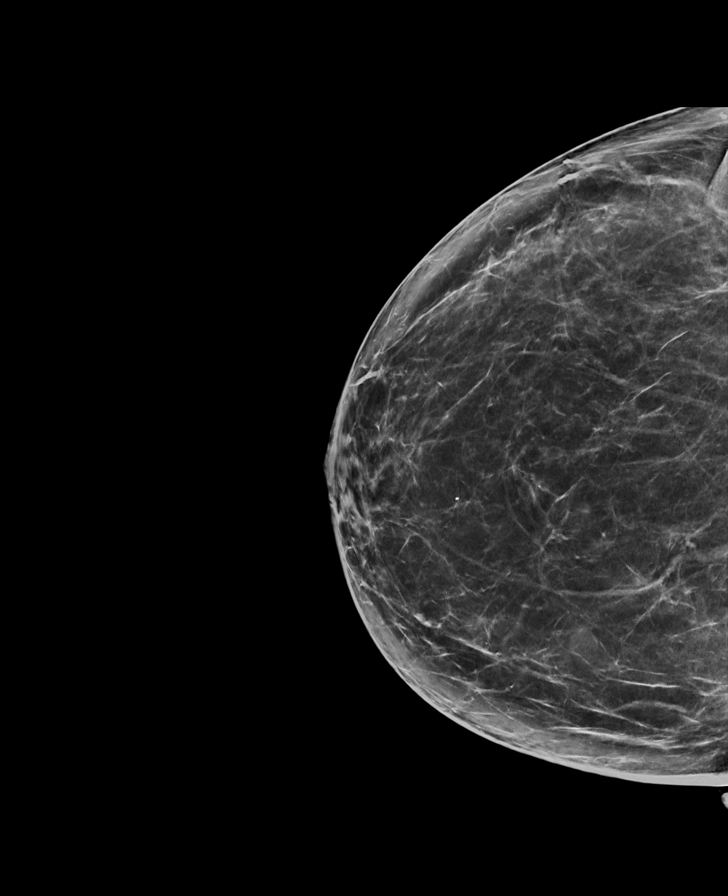

[L MLO]
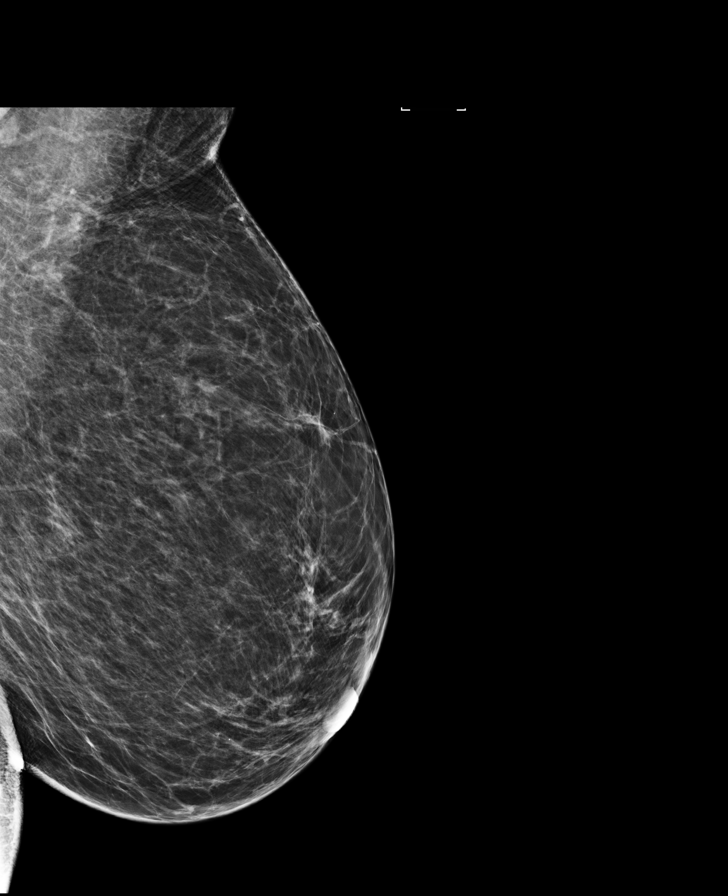

[R MLO]
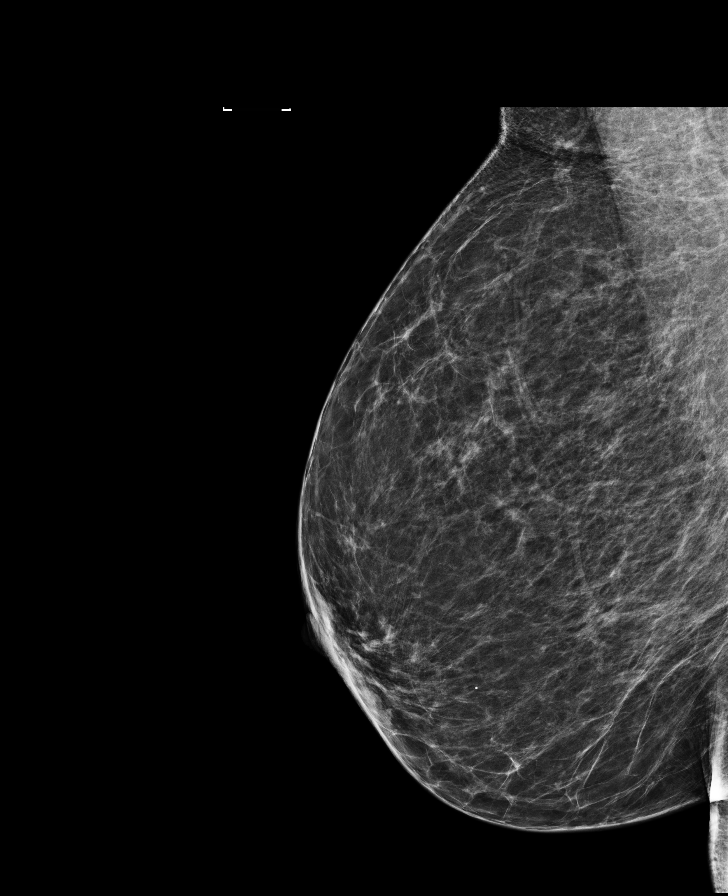

[L CC synth-2D]
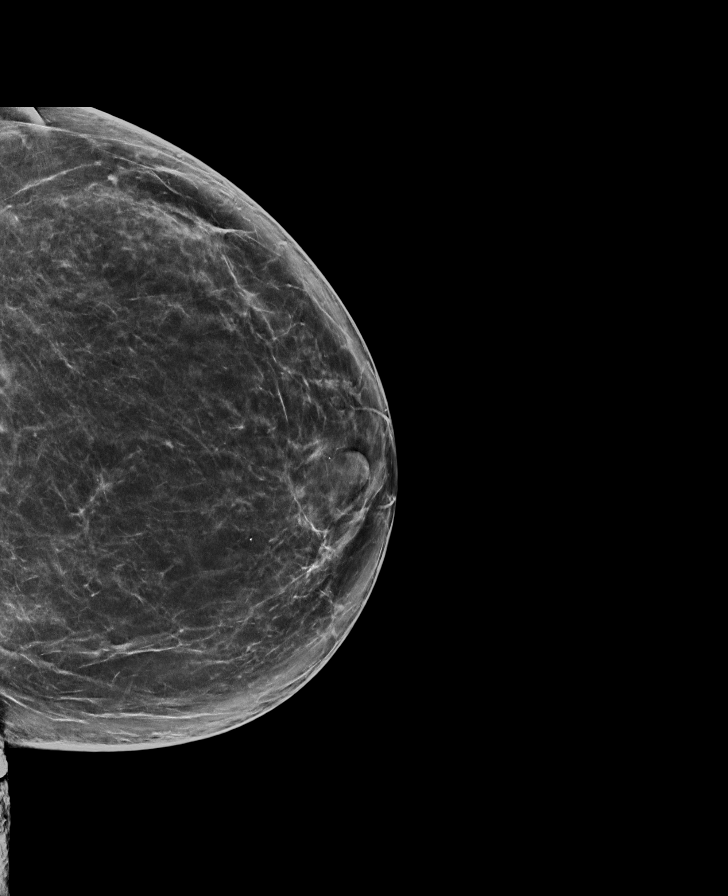

[8 of 28 positions shown; findings below may reference images not displayed]

ACR Breast Density Category b: There are scattered areas of
fibroglandular density.
FINDINGS: There are no findings suspicious for malignancy. Images were
processed with CAD.
IMPRESSION: No mammographic evidence of malignancy. A result letter of this
screening mammogram will be mailed directly to the patient.

RECOMMENDATION:
Screening mammogram in one year. (Code:[F0])

BI-RADS CATEGORY  1: Negative.

## 2015-08-31 ENCOUNTER — Other Ambulatory Visit: Payer: Self-pay | Admitting: Internal Medicine

## 2015-09-13 ENCOUNTER — Other Ambulatory Visit: Payer: Self-pay

## 2015-09-13 MED ORDER — PANTOPRAZOLE SODIUM 40 MG PO TBEC: 40.0000 mg | DELAYED_RELEASE_TABLET | Freq: Every day | ORAL | Status: DC

## 2015-09-18 ENCOUNTER — Ambulatory Visit (INDEPENDENT_AMBULATORY_CARE_PROVIDER_SITE_OTHER): Payer: Medicare Other | Admitting: Obstetrics and Gynecology

## 2015-09-18 ENCOUNTER — Encounter: Payer: Self-pay | Admitting: Obstetrics and Gynecology

## 2015-09-18 VITALS — BP 157/75 | HR 79 | Ht 62.0 in | Wt 145.9 lb

## 2015-09-18 DIAGNOSIS — N762 Acute vulvitis: Secondary | ICD-10-CM

## 2015-09-18 DIAGNOSIS — D259 Leiomyoma of uterus, unspecified: Secondary | ICD-10-CM

## 2015-09-18 DIAGNOSIS — Z780 Asymptomatic menopausal state: Secondary | ICD-10-CM | POA: Diagnosis not present

## 2015-09-18 MED ORDER — NYSTATIN-TRIAMCINOLONE 100000-0.1 UNIT/GM-% EX CREA: 1.0000 "application " | TOPICAL_CREAM | Freq: Two times a day (BID) | CUTANEOUS | Status: DC

## 2015-09-18 NOTE — Patient Instructions (Addendum)
1.  Nystatin/triamcinolone acetonide cream to be applied to the perianal region twice a day for 2 weeks. 2.  Contact us if irritative symptoms persist after 1 month. 3.  Continue with CombiPatch. 4.  Return in 1 year for follow-up on fibroids.  Clinically fibroids are stable at this time.

## 2015-09-18 NOTE — Progress Notes (Signed)
Patient ID: Mariah Moody, female   DOB: 08-25-1941, 74 y.o.   MRN: [id number] 1 year f/u on combi patch and fibroids  no complaints  Wants to continue combi patch  Chief complaint: 1.  Menopausal symptoms. 2.  Fibroid uterus.  74 year old female Para 3003, menopausal, previously on CombiPatch with Excellent control of vasomotor symptoms, with history of fibroid uterus, presents for follow-up on the above issues.  She discontinued her CombiPatch recently and has had recurrence of vasomotor symptoms.  She is interested in restarting the medication.  Past medical history, past surgical history, family history, social history, is reviewed.  OBJECTIVE: BP 157/75 mmHg  Pulse 79  Ht 5\' 2"  (1.575 m)  Wt 145 lb 14.4 oz (66.18 kg)  BMI 26.68 kg/m2 Pleasant, well-appearing female in no acute distress. Abdomen: Soft, nontender, without organomegaly.  No hernias. Pelvic exam: External genitalia-normal BUS-normal. Vagina-fair estrogen effect: Good vault support. Cervix-parous without lesions; no cervical motion tenderness; cervix is situated high in the vaginal vault. Uterus-12 week size, irregular, mobile, nontender Adnexa-nonpalpable, nontender. Rectovaginal-normal.  External exam shows none thrombosed hemorrhoids; Mild perianal erythema is present  ASSESSMENT: 1.  Vasomotor symptoms affecting quality of life, status post discontinuation of CombiPatch. 2.  Uterine fibroids, stable, 12 week size. 3.  Perianal inflammation  PLAN: 1.  Extensive review of benefits and risks of HRT were were completed. 2.  Restart CombiPatch. 3.  Return in 1 year for follow-up.  A total of 15 minutes were spent face-to-face with the patient during this encounter and over half of that time dealt with counseling and coordination of care.  Brayton Mars, MD  Note: This dictation was prepared with Dragon dictation along with smaller phrase technology. Any transcriptional errors that result from  this process are unintentional.

## 2015-09-24 ENCOUNTER — Telehealth: Payer: Self-pay | Admitting: *Deleted

## 2015-09-24 NOTE — Telephone Encounter (Signed)
Patient has requested a medication refill Levothroid

## 2015-09-25 ENCOUNTER — Other Ambulatory Visit: Payer: Self-pay

## 2015-09-25 MED ORDER — LEVOTHYROXINE SODIUM 75 MCG PO TABS: 75.0000 ug | ORAL_TABLET | Freq: Every day | ORAL | Status: DC

## 2015-09-25 NOTE — Telephone Encounter (Signed)
done

## 2015-09-29 ENCOUNTER — Other Ambulatory Visit: Payer: Self-pay | Admitting: Internal Medicine

## 2015-10-01 ENCOUNTER — Encounter: Payer: Self-pay | Admitting: Obstetrics and Gynecology

## 2015-11-01 ENCOUNTER — Other Ambulatory Visit: Payer: Self-pay | Admitting: Internal Medicine

## 2015-11-06 ENCOUNTER — Ambulatory Visit (INDEPENDENT_AMBULATORY_CARE_PROVIDER_SITE_OTHER): Payer: PPO | Admitting: Internal Medicine

## 2015-11-06 ENCOUNTER — Encounter: Payer: Self-pay | Admitting: Internal Medicine

## 2015-11-06 VITALS — BP 140/90 | HR 75 | Temp 97.7°F | Resp 18 | Ht 62.0 in | Wt 144.1 lb

## 2015-11-06 DIAGNOSIS — K219 Gastro-esophageal reflux disease without esophagitis: Secondary | ICD-10-CM | POA: Diagnosis not present

## 2015-11-06 DIAGNOSIS — D259 Leiomyoma of uterus, unspecified: Secondary | ICD-10-CM

## 2015-11-06 DIAGNOSIS — E119 Type 2 diabetes mellitus without complications: Secondary | ICD-10-CM

## 2015-11-06 DIAGNOSIS — E7800 Pure hypercholesterolemia, unspecified: Secondary | ICD-10-CM | POA: Diagnosis not present

## 2015-11-06 DIAGNOSIS — I10 Essential (primary) hypertension: Secondary | ICD-10-CM

## 2015-11-06 DIAGNOSIS — E039 Hypothyroidism, unspecified: Secondary | ICD-10-CM

## 2015-11-06 DIAGNOSIS — Z8371 Family history of colonic polyps: Secondary | ICD-10-CM

## 2015-11-06 LAB — MICROALBUMIN / CREATININE URINE RATIO
CREATININE, U: 103.6 mg/dL
MICROALB/CREAT RATIO: 0.7 mg/g (ref 0.0–30.0)

## 2015-11-06 LAB — COMPREHENSIVE METABOLIC PANEL
ALT: 15 U/L (ref 0–35)
AST: 16 U/L (ref 0–37)
Albumin: 4 g/dL (ref 3.5–5.2)
Alkaline Phosphatase: 77 U/L (ref 39–117)
BILIRUBIN TOTAL: 1 mg/dL (ref 0.2–1.2)
BUN: 18 mg/dL (ref 6–23)
CALCIUM: 9.5 mg/dL (ref 8.4–10.5)
CHLORIDE: 103 meq/L (ref 96–112)
CO2: 28 meq/L (ref 19–32)
Creatinine, Ser: 0.85 mg/dL (ref 0.40–1.20)
GFR: 69.75 mL/min (ref >60.00)
GLUCOSE: 163 mg/dL — AB (ref 70–99)
Potassium: 4.1 mEq/L (ref 3.5–5.1)
Sodium: 140 mEq/L (ref 135–145)
Total Protein: 6.5 g/dL (ref 6.0–8.3)

## 2015-11-06 LAB — LIPID PANEL
Cholesterol: 228 mg/dL — ABNORMAL HIGH (ref 0–200)
HDL: 32.2 mg/dL — ABNORMAL LOW (ref >39.00)
NonHDL: 195.98
Total CHOL/HDL Ratio: 7
Triglycerides: 351 mg/dL — ABNORMAL HIGH (ref 0.0–149.0)
VLDL: 70.2 mg/dL — ABNORMAL HIGH (ref 0.0–40.0)

## 2015-11-06 LAB — TSH: TSH: 3.39 u[IU]/mL (ref 0.35–4.50)

## 2015-11-06 LAB — LDL CHOLESTEROL, DIRECT: LDL DIRECT: 109 mg/dL

## 2015-11-06 LAB — HEMOGLOBIN A1C: HEMOGLOBIN A1C: 7.1 % — AB (ref 4.6–6.5)

## 2015-11-06 NOTE — Progress Notes (Signed)
Patient ID: Mariah Moody, female   DOB: [birth year], 75 y.o.   MRN: [id number]   Subjective:    Patient ID: Mariah Moody, female    DOB: 24-May-1941, 75 y.o.   MRN: [id number]  HPI  Patient with past history of hypercholesterolemia, diabetes, GERD, hypothyroidism and hypertension.  She comes in today to follow up on these issues.  Saw gyn 09/2015.  On combi patch.  Wants to remain on estrogen.  Tries to stay active.  No cardiac symptoms with increased activity or exertion.  No sob.  No acid reflux reported.  No abdominal pain or cramping.  Bowels stable.     Past Medical History  Diagnosis Date  . Hypothyroidism   . Diabetes mellitus without complication ([place])   . History of chicken pox   . GERD (gastroesophageal reflux disease)   . Hyperlipidemia   . Hypertension   . Anemia   . Coronary atherosclerosis of native coronary vessel   . Fibroid    Past Surgical History  Procedure Laterality Date  . Tubal ligation    . Colonoscopy N/A 04/15/2015    Procedure: COLONOSCOPY;  Surgeon: Hulen Luster, MD;  Location: [facility] ENDOSCOPY;  Service: Gastroenterology;  Laterality: N/A;  . Esophagogastroduodenoscopy N/A 04/15/2015    Procedure: ESOPHAGOGASTRODUODENOSCOPY (EGD);  Surgeon: Hulen Luster, MD;  Location: [facility] ENDOSCOPY;  Service: Gastroenterology;  Laterality: N/A;   Family History  Problem Relation Age of Onset  . Arthritis Mother   . Hypertension Mother   . Colon polyps Mother   . Heart disease Father   . Hypertension Father   . Diabetes Brother   . Cancer Neg Hx    Social History   Social History  . Marital Status: Married    Spouse Name: N/A  . Number of Children: N/A  . Years of Education: N/A   Social History Main Topics  . Smoking status: Former Research scientist (life sciences)  . Smokeless tobacco: Never Used  . Alcohol Use: 0.0 oz/week    0 Standard drinks or equivalent per week     Comment: occas  . Drug Use: No  . Sexual Activity: Not Currently   Other Topics Concern  . None    Social History Narrative    Outpatient Encounter Prescriptions as of 11/06/2015  Medication Sig  . Calcium Carbonate-Vitamin D (CALCIUM + D PO) Take 1 tablet by mouth daily.  . Cholecalciferol (VITAMIN D PO) Take 1 capsule by mouth daily.   Marland Kitchen estradiol-norethindrone (COMBIPATCH) 0.05-0.25 MG/DAY Place 1 patch onto the skin 2 (two) times a week.  . hydrochlorothiazide (MICROZIDE) 12.5 MG capsule Take 1 capsule by mouth  daily  . levothyroxine (SYNTHROID, LEVOTHROID) 75 MCG tablet Take 1 tablet (75 mcg total) by mouth daily before breakfast.  . losartan-hydrochlorothiazide (HYZAAR) 50-12.5 MG tablet Take 1 tablet by mouth  daily  . Multiple Vitamins-Minerals (MULTIVITAMIN PO) Take 1 tablet by mouth daily.   Marland Kitchen nystatin-triamcinolone (MYCOLOG II) cream Apply 1 application topically 2 (two) times daily.  . pantoprazole (PROTONIX) 40 MG tablet Take 1 tablet by mouth  daily  . [DISCONTINUED] levothyroxine (SYNTHROID, LEVOTHROID) 75 MCG tablet Take 1 tablet by mouth  daily before breakfast   No facility-administered encounter medications on file as of 11/06/2015.    Review of Systems  Constitutional: Negative for appetite change and unexpected weight change.  HENT: Negative for congestion and sinus pressure.   Respiratory: Negative for cough, chest tightness and shortness of breath.   Cardiovascular: Negative for chest pain, palpitations  and leg swelling.  Gastrointestinal: Negative for nausea, vomiting, abdominal pain and diarrhea.  Genitourinary: Negative for dysuria and difficulty urinating.  Musculoskeletal: Negative for back pain and joint swelling.  Skin: Negative for color change and rash.  Neurological: Negative for dizziness, light-headedness and headaches.  Psychiatric/Behavioral: Negative for dysphoric mood and agitation.       Objective:     Blood pressure rechecked by me:  140-142/78-80 Rechecked prior to leaving:  134/80   Physical Exam  Constitutional: She appears  well-developed and well-nourished. No distress.  HENT:  Nose: Nose normal.  Mouth/Throat: Oropharynx is clear and moist.  Neck: Neck supple. No thyromegaly present.  Cardiovascular: Normal rate and regular rhythm.   Pulmonary/Chest: Breath sounds normal. No respiratory distress. She has no wheezes.  Abdominal: Soft. Bowel sounds are normal. There is no tenderness.  Musculoskeletal: She exhibits no edema or tenderness.  Lymphadenopathy:    She has no cervical adenopathy.  Psychiatric: She has a normal mood and affect. Her behavior is normal.    BP 140/90 mmHg  Pulse 75  Temp(Src) 97.7 F (36.5 C) (Oral)  Resp 18  Ht _0  (1.575 m)  Wt 144 lb 2 oz (65.375 kg)  BMI 26.35 kg/m2  SpO2 97% Wt Readings from Last 3 Encounters:  11/06/15 144 lb 2 oz (65.375 kg)  09/18/15 145 lb 14.4 oz (66.18 kg)  07/30/15 141 lb 2 oz (64.014 kg)     Lab Results  Component Value Date   WBC 5.6 05/09/2015   HGB 13.5 05/09/2015   HCT 39.3 05/09/2015   PLT 259.0 05/09/2015   GLUCOSE 163* 11/06/2015   CHOL 228* 11/06/2015   TRIG 351.0* 11/06/2015   HDL 32.20* 11/06/2015   LDLDIRECT 109.0 11/06/2015   ALT 15 11/06/2015   AST 16 11/06/2015   NA 140 11/06/2015   K 4.1 11/06/2015   CL 103 11/06/2015   CREATININE 0.85 11/06/2015   BUN 18 11/06/2015   CO2 28 11/06/2015   TSH 3.39 11/06/2015   HGBA1C 7.1* 11/06/2015   MICROALBUR <0.7 11/06/2015    Mm Screening Breast Tomo Bilateral  08/12/2015  CLINICAL DATA:  Screening. EXAM: DIGITAL SCREENING BILATERAL MAMMOGRAM WITH 3D TOMO WITH CAD COMPARISON:  Previous exam(s). ACR Breast Density Category b: There are scattered areas of fibroglandular density. FINDINGS: There are no findings suspicious for malignancy. Images were processed with CAD. IMPRESSION: No mammographic evidence of malignancy. A result letter of this screening mammogram will be mailed directly to the patient. RECOMMENDATION: Screening mammogram in one year. (Code:SM-B-01Y) BI-RADS  CATEGORY  1: Negative. Electronically Signed   By: Lillia Mountain M.D.   On: 08/12/2015 10:27       Assessment & Plan:   Problem List Items Addressed This Visit    Diabetes mellitus type II, controlled, with no complications ([place])    Sugars have been under good control.  States has not been watching her diet as well.  Not exercising as much.  Plans to restart her walking.  Discussed monitoring her carb intake.  Follow met b and a1c.  Up to date with eye checks.  States she goes yearly.        Relevant Orders   Hemoglobin A1c (Completed)   Microalbumin / creatinine urine ratio (Completed)   Essential hypertension, benign - Primary    Blood pressure elevated initially.  On recheck improved.  She monitors.  Outside checks averaging 134/80.  Continue current medication regimen.  Follow pressures.  Check metabolic panel.  Relevant Orders   Comprehensive metabolic panel (Completed)   Family history of colonic polyps    Colonoscopy 04/15/15 - entire colon normal.        Fibroid, uterine    Just evaluated by Dr Enzo Bi 09/2015.  On combipatch.  Wants to continue.        GERD (gastroesophageal reflux disease)    On protonix.  No acid reflux problems reported.        Hypercholesterolemia    Low cholesterol diet and exercise.  Follow lipid panel.        Relevant Orders   Lipid panel (Completed)   Hypothyroidism    On thyroid replacement.  Follow tsh.       Relevant Orders   TSH (Completed)       Einar Pheasant, MD

## 2015-11-06 NOTE — Assessment & Plan Note (Signed)
Colonoscopy 04/15/15 - entire colon normal.  

## 2015-11-06 NOTE — Assessment & Plan Note (Signed)
On protonix.  No acid reflux problems reported.

## 2015-11-06 NOTE — Assessment & Plan Note (Signed)
Sugars have been under good control.  States has not been watching her diet as well.  Not exercising as much.  Plans to restart her walking.  Discussed monitoring her carb intake.  Follow met b and a1c.  Up to date with eye checks.  States she goes yearly.

## 2015-11-06 NOTE — Assessment & Plan Note (Signed)
Low cholesterol diet and exercise.  Follow lipid panel.   

## 2015-11-06 NOTE — Assessment & Plan Note (Signed)
Just evaluated by Dr Enzo Bi 09/2015.  On combipatch.  Wants to continue.

## 2015-11-06 NOTE — Progress Notes (Signed)
Pre-visit discussion using our clinic review tool. No additional management support is needed unless otherwise documented below in the visit note.  

## 2015-11-06 NOTE — Assessment & Plan Note (Signed)
On thyroid replacement.  Follow tsh.  

## 2015-11-06 NOTE — Assessment & Plan Note (Addendum)
Blood pressure elevated initially.  On recheck improved.  She monitors.  Outside checks averaging 134/80.  Continue current medication regimen.  Follow pressures.  Check metabolic panel.

## 2015-11-07 ENCOUNTER — Encounter: Payer: Self-pay | Admitting: Internal Medicine

## 2015-11-07 NOTE — Telephone Encounter (Signed)
Unread mychart message mailed to patient along with handouts

## 2015-11-10 ENCOUNTER — Encounter: Payer: Self-pay | Admitting: Internal Medicine

## 2015-11-12 ENCOUNTER — Telehealth: Payer: Self-pay | Admitting: Internal Medicine

## 2015-11-12 NOTE — Telephone Encounter (Signed)
Called Pt and left message to make a 65m follow up for April and labs 1-2 days before appt.. Please schedule when pt calls back.. thanks

## 2015-11-29 ENCOUNTER — Telehealth: Payer: Self-pay | Admitting: *Deleted

## 2015-11-29 DIAGNOSIS — I10 Essential (primary) hypertension: Secondary | ICD-10-CM

## 2015-11-29 DIAGNOSIS — E039 Hypothyroidism, unspecified: Secondary | ICD-10-CM

## 2015-11-29 DIAGNOSIS — E119 Type 2 diabetes mellitus without complications: Secondary | ICD-10-CM

## 2015-11-29 DIAGNOSIS — E7800 Pure hypercholesterolemia, unspecified: Secondary | ICD-10-CM

## 2015-11-29 NOTE — Telephone Encounter (Signed)
Pt is asking for Orders to get her A1c drawn before her appt with you.

## 2015-11-29 NOTE — Telephone Encounter (Signed)
Patient has requested labs to be ordered prior to her appt on 03/09/16. She stated that she usually has a A1c before her appt.

## 2015-12-01 NOTE — Telephone Encounter (Signed)
Orders placed for labs

## 2015-12-17 ENCOUNTER — Telehealth: Payer: Self-pay

## 2015-12-17 MED ORDER — PANTOPRAZOLE SODIUM 40 MG PO TBEC: DELAYED_RELEASE_TABLET | ORAL | Status: DC

## 2015-12-17 NOTE — Telephone Encounter (Signed)
Pts insurance changed and needed her Rx for protonix called into her pharmacy in [facility]

## 2016-01-27 ENCOUNTER — Other Ambulatory Visit: Payer: Self-pay | Admitting: Internal Medicine

## 2016-01-27 MED ORDER — HYDROCHLOROTHIAZIDE 12.5 MG PO CAPS: ORAL_CAPSULE | ORAL | Status: DC

## 2016-01-27 NOTE — Addendum Note (Signed)
Addended by: Vergia Alcon D on: 01/27/2016 04:44 PM   Modules accepted: Orders

## 2016-02-23 ENCOUNTER — Other Ambulatory Visit: Payer: Self-pay | Admitting: Internal Medicine

## 2016-02-25 ENCOUNTER — Other Ambulatory Visit (INDEPENDENT_AMBULATORY_CARE_PROVIDER_SITE_OTHER): Payer: PPO

## 2016-02-25 DIAGNOSIS — E119 Type 2 diabetes mellitus without complications: Secondary | ICD-10-CM

## 2016-02-25 DIAGNOSIS — E7800 Pure hypercholesterolemia, unspecified: Secondary | ICD-10-CM

## 2016-02-25 DIAGNOSIS — I10 Essential (primary) hypertension: Secondary | ICD-10-CM

## 2016-02-25 DIAGNOSIS — E039 Hypothyroidism, unspecified: Secondary | ICD-10-CM | POA: Diagnosis not present

## 2016-02-25 LAB — BASIC METABOLIC PANEL
BUN: 20 mg/dL (ref 6–23)
CO2: 30 mEq/L (ref 19–32)
Calcium: 9.5 mg/dL (ref 8.4–10.5)
Chloride: 104 mEq/L (ref 96–112)
Creatinine, Ser: 0.69 mg/dL (ref 0.40–1.20)
GFR: 88.65 mL/min (ref >60.00)
GLUCOSE: 142 mg/dL — AB (ref 70–99)
POTASSIUM: 4 meq/L (ref 3.5–5.1)
SODIUM: 140 meq/L (ref 135–145)

## 2016-02-25 LAB — CBC WITH DIFFERENTIAL/PLATELET
BASOS ABS: 0 10*3/uL (ref 0.0–0.1)
Basophils Relative: 0.4 % (ref 0.0–3.0)
Eosinophils Absolute: 0.1 10*3/uL (ref 0.0–0.7)
Eosinophils Relative: 2.9 % (ref 0.0–5.0)
HEMATOCRIT: 38.3 % (ref 36.0–46.0)
HEMOGLOBIN: 12.9 g/dL (ref 12.0–15.0)
Lymphocytes Relative: 32.6 % (ref 12.0–46.0)
Lymphs Abs: 1.7 10*3/uL (ref 0.7–4.0)
MCHC: 33.8 g/dL (ref 30.0–36.0)
MCV: 90.7 fl (ref 78.0–100.0)
MONOS PCT: 6.9 % (ref 3.0–12.0)
Monocytes Absolute: 0.4 10*3/uL (ref 0.1–1.0)
NEUTROS PCT: 57.2 % (ref 43.0–77.0)
Neutro Abs: 3 10*3/uL (ref 1.4–7.7)
Platelets: 260 10*3/uL (ref 150.0–400.0)
RBC: 4.22 Mil/uL (ref 3.87–5.11)
RDW: 12.4 % (ref 11.5–15.5)
WBC: 5.2 10*3/uL (ref 4.0–10.5)

## 2016-02-25 LAB — HEPATIC FUNCTION PANEL
ALBUMIN: 4.1 g/dL (ref 3.5–5.2)
ALT: 10 U/L (ref 0–35)
AST: 13 U/L (ref 0–37)
Alkaline Phosphatase: 69 U/L (ref 39–117)
Bilirubin, Direct: 0.1 mg/dL (ref 0.0–0.3)
TOTAL PROTEIN: 6.5 g/dL (ref 6.0–8.3)
Total Bilirubin: 0.9 mg/dL (ref 0.2–1.2)

## 2016-02-25 LAB — LIPID PANEL
Cholesterol: 197 mg/dL (ref 0–200)
HDL: 34 mg/dL — AB (ref >39.00)
LDL Cholesterol: 135 mg/dL — ABNORMAL HIGH (ref 0–99)
NONHDL: 162.53
Total CHOL/HDL Ratio: 6
Triglycerides: 137 mg/dL (ref 0.0–149.0)
VLDL: 27.4 mg/dL (ref 0.0–40.0)

## 2016-02-25 LAB — HEMOGLOBIN A1C: Hgb A1c MFr Bld: 6.5 % (ref 4.6–6.5)

## 2016-02-25 LAB — TSH: TSH: 3.43 u[IU]/mL (ref 0.35–4.50)

## 2016-02-26 ENCOUNTER — Other Ambulatory Visit: Payer: PPO

## 2016-02-26 ENCOUNTER — Encounter: Payer: Self-pay | Admitting: Internal Medicine

## 2016-03-09 ENCOUNTER — Ambulatory Visit: Payer: PPO | Admitting: Internal Medicine

## 2016-03-18 ENCOUNTER — Ambulatory Visit (INDEPENDENT_AMBULATORY_CARE_PROVIDER_SITE_OTHER): Payer: PPO | Admitting: Internal Medicine

## 2016-03-18 ENCOUNTER — Encounter: Payer: Self-pay | Admitting: Internal Medicine

## 2016-03-18 VITALS — BP 122/82 | HR 73 | Temp 98.0°F | Resp 18 | Ht 62.0 in | Wt 138.5 lb

## 2016-03-18 DIAGNOSIS — K219 Gastro-esophageal reflux disease without esophagitis: Secondary | ICD-10-CM

## 2016-03-18 DIAGNOSIS — E039 Hypothyroidism, unspecified: Secondary | ICD-10-CM

## 2016-03-18 DIAGNOSIS — I10 Essential (primary) hypertension: Secondary | ICD-10-CM | POA: Diagnosis not present

## 2016-03-18 DIAGNOSIS — E7800 Pure hypercholesterolemia, unspecified: Secondary | ICD-10-CM

## 2016-03-18 DIAGNOSIS — E119 Type 2 diabetes mellitus without complications: Secondary | ICD-10-CM | POA: Diagnosis not present

## 2016-03-18 MED ORDER — LOSARTAN POTASSIUM-HCTZ 50-12.5 MG PO TABS: 1.0000 | ORAL_TABLET | Freq: Every day | ORAL | Status: DC

## 2016-03-18 NOTE — Patient Instructions (Addendum)
This is Dr. Lupita Dawn version of a  "Low GI"  Weight loss Diet.  It is appropriate for all patients with normal renal function , gluten tolerance, and advised for patients who have prediabetes or diabetes:   All of the foods can be found at grocery stores and in bulk at [employer].  The Atkins protein bars and shakes are available in more varieties at Target, WalMart and [place].    7 AM Breakfast:  Choose from the following:  < 5 carbs  Weekdays: Low carbohydrate Protein  Shakes (EAS AdvantEdge "Carb  Control" shakes, Atkins,  Muscle Milk or Premier Protein shakes)     Weekends:  a scrambled egg/bacon/cheese burrito made with Mission's "carb  balance" whole wheat tortilla  (about 10 net carbs )  Eggs,  bacon /sausage , Joseph's pita /lavash bread or  (5 carbs)  A slice of fritatta ( egg based baked dish, no  crust:  google it) (< 10 carbs)   Avoid cereal and bananas, oatmeal and cream of wheat and grits. They are loaded with carbohydrates!  10 AM: high protein snack  (< 5 carbs)   Protein bar by Atkins  Or KIND  (the snack size, < 200 cal, usually < 6 carbs    A stick of cheese:  Around 1 carb,  100 cal      Other so called "protein bars" tend to be loaded with carbohydrates.  Remember, in food advertising, the word "energy" is synonymous for " carbohydrate."  Lunch:   A Sandwich using the bread choices listed, Can use any  Eggs,  lunchmeat, grilled meat or canned tuna).  Can add avocado, regular mayo/mustard  and cheese.  A Salad using blue cheese, ranch,  Goddess dressing  or vinagrette,  No croutons or "confetti" and no "candied nuts" but regular nuts OK.   2 HARD BOILED EGG WHITES AND A CUP OF one of these greek yogurts:    dannon lt n fit greek yogurt         chobani 100 greek yogurt,    Oikos triple zero greek yogurt       No pretzels or chips.  Pickles and miniature sweet peppers are a good low  carb alternative that provide a "crunch"  The bread is the only source of carbohydrate  in a sandwich and  can be decreased by trying some of these alternatives to traditional loaf bread:   Joseph's pita bread and Lavash (flat) bread :  50 cal and 4 net carbs  available at BJs and WalMart.  Taste better when toasted, use as pita chips  Touf[employer] makes a variety of  flatbreads and  A PITA POCKET.    LOOK FOR  THE ONES THAT ARE 17 NET CARBS OR LESS    Mission makes 2 sizes of  Low carb whole wheat tortillas  (The large one is  210 cal and 6 net carbs)   Avoid "Low fat dressings, as well as Barry Brunner and [place] dressings    3 PM/ Mid day  Snack:  Consider  1 ounce of  almonds, walnuts, pistachios, pecans, peanuts,  Macadamia nuts or a nut medley that does not contain raisins or cranberries.  No "granola"; the dried cranberries and raisins are loaded with carbohydrates. Mixed nuts as long as there are no raisins,  cranberries or dried fruit.    Try the prosciutto/mozzarella cheese sticks by [employer]  In deli /backery section   High protein   To  avoid overindulging in snacks: Try drinking a glass of unsweeted almond/coconut milk  Or a cup of coffee with your Atkins chocolate bar to keep you from having 3!!!   Pork rinds!  Yes Pork Rinds are low carb potato chip substitute!   Toasted Joseph's flatbread with hummous dip (chickpeas)    6 PM  Dinner:     Meat/fowl/fish with a green salad, and either broccoli, cauliflower, green beans, spinach, brussel sprouts, bok choy or  Lima beans. Fried in canola oil /olive oil BUT DO NOT BREAD THE PROTEIN!!      There is a low carb pasta by Dreamfield's that is acceptable and tastes great: only 5 digestible carbs/serving.( All grocery stores but BJs carry it )  Prepared Meals:  Try Hurley Cisco Angelo's chicken piccata or chicken or eggplant parm over low carb pasta.(Lowes and BJs)   Marjory Lies Sanchez's "Carnitas" (pulled pork, no sauce,  0 carbs) or his beef pot roast to make a dinner burrito (at [employer])  Barbecue with cole slaw is low carb BUT NO BUN!   SAME WITH HAMBURGERS    Whole wheat pasta is still full of digestible carbs and  Not as low in glycemic index as Dreamfield's.   Brown rice is still rice,  So skip the rice and noodles if you eat [place] or [place] (or at least limit to 1/2 cup)  9 PM snack :   Breyer's "low carb" fudgsicle or  ice cream bar (Carb Smart line), or  Weight  Watcher's ice cream bar , or another "no sugar added" ice cream;  a serving of fresh berries/cherries with whipped cream   Cheese or greek yogurt   8 ounces of [place] unsweetened almond/cococunut milk  Cheese and crackers (using WASA crackers,  They are low carb) or peanut butter on low carb crackers or pita bread     Avoid bananas, pineapple, grapes  and watermelon on a regular basis because they are high in sugar.  THINK OF THEM AS DESSERT and do not have daily   Remember that snack Substitutions should be less than 10 NET carbs per serving and meals should be < 20 net carbs. Remember that carbohydrates from fiber do not affect blood sugar, so you can  subtract fiber grams to get the "net carbs " of any particular food item.        Low Glycemic Foods Breakfast All-bran    All-bran fruit n oats Fiber one      Oatmeal (not instant) Oat bran  Fruits and juices (limit to 1-2 servings per day) Apples  Apricots (fresh & dried) Blackberries Blueberries Cherries Cranberries Peaches Pears Plums  Prunes Grapefruit Raspberries Strawberries Tangerine  Apple Juice Grapefruit Juice Tomato Juice  Beans and Legumes (fresh-cooked) Black-eye peas Butter beans Chick peas  Lentils Green beans  Lima Beans Kidney beans  Navy beans Pinto beans  Snow peas  Non-starchy vega tables: Asparagus, avocado, broccoli, cabbage, cauliflower, celery, cucumber, greens, lettuce, mushrooms, peppers, tomatoes, okra, onion, spinach, summer squash  Grains: Barley  Bulgur Rye  Wild rice  Nuts and oils Almonds, peanuts, sunflower seeds, hazelnuts, pecans, walnuts,    oils that are liquid at room temperature.  Dairy, fish, meat, soy and eggs; Milk, skim  Lowfat cheese Yogurt, lowfat, fruit sugar sweetened  Lean red meat  Fish Skinless chicken & [place] Shellfish  Egg whites (up to 3 daily) Soy products Egg yolks        Moderate Glycemic foods to limit Breakfast Cereals Brand  buds Bran Chex Just right Mini-wheat's Special K   Fruits Banana (under ripe) Dates Figs   Grapes Kiwi   Mango Oranges  Raisins  Fruit juices Cranberry juice Orange juice  Beans and legumes [place] type baked beans, canned pinto, kidney, or navy beans, green peas  Vegetables Beets  Carrots Sweet potato Yam Corn on the cob  Breads Pita (pocket) bread Oat bran bread pumpernickle bread Rye bread Wheat bread, high fiber  Grains cornmeal Rice, brown Rice, white Couscous  Past Macaroni Pizza, cheese Ravioli, meat filled Spaghetti, white  Nuts Cashews Macadamia  Snacks Chocolate Ice cream, lowfat Muffin  Popcorn          Avoid High Glycemic foods  Breakfast cereals Cheerios Corn Chex Corn flakes Cream of wheat Grape nuts Grape nut flakes Grits  nutri-grain Puffed rice Puffed wheat Rice Chex Rice krispies Shredded wheat Team Total  Fruits Pineapple, Watermelon, Banana (over ripe)  Beverages Sodas, sweet tea, pineapple juice  Vegetables Potato (bakes, boiled, fried, mashed) [place] fries Canned or frozen corn Parsnips Winter squash  Breads Most breads (white and whole grain) Bagels  Bread sticks Bread stuffing Kaiser roll Dinner rolls  Grains Rice, instant Tapioca, with milk  Candy and most cookies  Snacks Donuts  Corn chips Jelly beans Pretzels Pastries  Restaurants and ethnic foods Most chinese food (sugar in stir fry or wok sauces) Teriyaki-style meats and vegetables

## 2016-03-18 NOTE — Progress Notes (Signed)
Patient ID: Mariah Moody, female   DOB: [birth year], 75 y.o.   MRN: [id number]   Subjective:    Patient ID: Mariah Moody, female    DOB: 03/17/1941, 75 y.o.   MRN: [id number]  HPI  Patient here for a scheduled follow up.  She has adjusted her diet.  Lost weight.  Discussed labs. a1c and triglycerides have improved.  She feels good.  Stays physically active.  Discussed exercise.  No cardiac symptoms with increased activity or exertion.  No sob.  No acid reflux.  No abdominal pain or cramping.  Bowels stable.  States blood pressure averaging in the 021J systolic.     Past Medical History  Diagnosis Date  . Hypothyroidism   . Diabetes mellitus without complication ([place])   . History of chicken pox   . GERD (gastroesophageal reflux disease)   . Hyperlipidemia   . Hypertension   . Anemia   . Coronary atherosclerosis of native coronary vessel   . Fibroid    Past Surgical History  Procedure Laterality Date  . Tubal ligation    . Colonoscopy N/A 04/15/2015    Procedure: COLONOSCOPY;  Surgeon: Hulen Luster, MD;  Location: [facility] ENDOSCOPY;  Service: Gastroenterology;  Laterality: N/A;  . Esophagogastroduodenoscopy N/A 04/15/2015    Procedure: ESOPHAGOGASTRODUODENOSCOPY (EGD);  Surgeon: Hulen Luster, MD;  Location: [facility] ENDOSCOPY;  Service: Gastroenterology;  Laterality: N/A;   Family History  Problem Relation Age of Onset  . Arthritis Mother   . Hypertension Mother   . Colon polyps Mother   . Heart disease Father   . Hypertension Father   . Diabetes Brother   . Cancer Neg Hx    Social History   Social History  . Marital Status: Married    Spouse Name: N/A  . Number of Children: N/A  . Years of Education: N/A   Social History Main Topics  . Smoking status: Former Research scientist (life sciences)  . Smokeless tobacco: Never Used  . Alcohol Use: 0.0 oz/week    0 Standard drinks or equivalent per week     Comment: occas  . Drug Use: No  . Sexual Activity: Not Currently   Other Topics Concern  .  None   Social History Narrative    Outpatient Encounter Prescriptions as of 03/18/2016  Medication Sig  . Calcium Carbonate-Vitamin D (CALCIUM + D PO) Take 1 tablet by mouth daily.  . Cholecalciferol (VITAMIN D PO) Take 1 capsule by mouth daily.   Marland Kitchen estradiol-norethindrone (COMBIPATCH) 0.05-0.25 MG/DAY Place 1 patch onto the skin 2 (two) times a week.  . hydrochlorothiazide (MICROZIDE) 12.5 MG capsule Take 1 capsule by mouth  daily  . levothyroxine (SYNTHROID, LEVOTHROID) 75 MCG tablet Take 1 tablet (75 mcg total) by mouth daily before breakfast.  . losartan-hydrochlorothiazide (HYZAAR) 50-12.5 MG tablet Take 1 tablet by mouth daily.  . Multiple Vitamins-Minerals (MULTIVITAMIN PO) Take 1 tablet by mouth daily.   Marland Kitchen nystatin-triamcinolone (MYCOLOG II) cream Apply 1 application topically 2 (two) times daily.  . pantoprazole (PROTONIX) 40 MG tablet Take 1 tablet by mouth  daily  . [DISCONTINUED] losartan-hydrochlorothiazide (HYZAAR) 50-12.5 MG tablet TAKE 1 TABLET BY MOUTH EVERY DAY   No facility-administered encounter medications on file as of 03/18/2016.    Review of Systems  Constitutional: Negative for appetite change.       Has adjusted her diet.  Lost weight.    HENT: Negative for congestion and sinus pressure.   Respiratory: Negative for cough, chest tightness and shortness of  breath.   Cardiovascular: Negative for chest pain, palpitations and leg swelling.  Gastrointestinal: Negative for nausea, vomiting, abdominal pain and diarrhea.  Genitourinary: Negative for dysuria and difficulty urinating.  Musculoskeletal: Negative for back pain and joint swelling.  Skin: Negative for color change and rash.  Neurological: Negative for dizziness, light-headedness and headaches.  Psychiatric/Behavioral: Negative for dysphoric mood and agitation.       Objective:     Blood pressure rechecked by me:  138/78-80  Physical Exam  Constitutional: She appears well-developed and well-nourished.  No distress.  HENT:  Nose: Nose normal.  Mouth/Throat: Oropharynx is clear and moist.  Neck: Neck supple. No thyromegaly present.  Cardiovascular: Normal rate and regular rhythm.   Pulmonary/Chest: Breath sounds normal. No respiratory distress. She has no wheezes.  Abdominal: Soft. Bowel sounds are normal. There is no tenderness.  Musculoskeletal: She exhibits no edema or tenderness.  Lymphadenopathy:    She has no cervical adenopathy.  Skin: No rash noted. No erythema.  Psychiatric: She has a normal mood and affect. Her behavior is normal.    BP 122/82 mmHg  Pulse 73  Temp(Src) 98 F (36.7 C) (Oral)  Resp 18  Ht 5' 2" (1.575 m)  Wt 138 lb 8 oz (62.823 kg)  BMI 25.33 kg/m2  SpO2 96% Wt Readings from Last 3 Encounters:  03/18/16 138 lb 8 oz (62.823 kg)  11/06/15 144 lb 2 oz (65.375 kg)  09/18/15 145 lb 14.4 oz (66.18 kg)     Lab Results  Component Value Date   WBC 5.2 02/25/2016   HGB 12.9 02/25/2016   HCT 38.3 02/25/2016   PLT 260.0 02/25/2016   GLUCOSE 142* 02/25/2016   CHOL 197 02/25/2016   TRIG 137.0 02/25/2016   HDL 34.00* 02/25/2016   LDLDIRECT 109.0 11/06/2015   LDLCALC 135* 02/25/2016   ALT 10 02/25/2016   AST 13 02/25/2016   NA 140 02/25/2016   K 4.0 02/25/2016   CL 104 02/25/2016   CREATININE 0.69 02/25/2016   BUN 20 02/25/2016   CO2 30 02/25/2016   TSH 3.43 02/25/2016   HGBA1C 6.5 02/25/2016   MICROALBUR <0.7 11/06/2015    Mm Screening Breast Tomo Bilateral  08/12/2015  CLINICAL DATA:  Screening. EXAM: DIGITAL SCREENING BILATERAL MAMMOGRAM WITH 3D TOMO WITH CAD COMPARISON:  Previous exam(s). ACR Breast Density Category b: There are scattered areas of fibroglandular density. FINDINGS: There are no findings suspicious for malignancy. Images were processed with CAD. IMPRESSION: No mammographic evidence of malignancy. A result letter of this screening mammogram will be mailed directly to the patient. RECOMMENDATION: Screening mammogram in one year.  (Code:SM-B-01Y) BI-RADS CATEGORY  1: Negative. Electronically Signed   By: Lillia Mountain M.D.   On: 08/12/2015 10:27       Assessment & Plan:   Problem List Items Addressed This Visit    Diabetes mellitus type II, controlled, with no complications ([place])    Has adjusted her diet.  Lost weight.  Feels good.  a1c just checked 6.5.  Follow met b and a1c.       Relevant Medications   losartan-hydrochlorothiazide (HYZAAR) 50-12.5 MG tablet   Other Relevant Orders   Hemoglobin A1c   Essential hypertension, benign - Primary    Blood pressure under good control.  Continue same medication regimen.  Follow pressures.  Follow metabolic panel.        Relevant Medications   losartan-hydrochlorothiazide (HYZAAR) 50-12.5 MG tablet   Other Relevant Orders   Basic metabolic panel  GERD (gastroesophageal reflux disease)    No acid reflux issues reported.  On protonix.       Hypercholesterolemia    She has adjusted her diet.  Triglycerides just checked - 137.  LDL 135.  Discussed diet and exercise.  She wants to continue with diet and exercise.  Follow lipid panel.        Relevant Medications   losartan-hydrochlorothiazide (HYZAAR) 50-12.5 MG tablet   Other Relevant Orders   Hepatic function panel   Lipid panel   Hypothyroidism    On thyroid replacement.  Follow tsh.           Einar Pheasant, MD

## 2016-03-18 NOTE — Progress Notes (Signed)
Pre-visit discussion using our clinic review tool. No additional management support is needed unless otherwise documented below in the visit note.  

## 2016-03-30 ENCOUNTER — Encounter: Payer: Self-pay | Admitting: Internal Medicine

## 2016-03-30 NOTE — Assessment & Plan Note (Signed)
She has adjusted her diet.  Triglycerides just checked - 137.  LDL 135.  Discussed diet and exercise.  She wants to continue with diet and exercise.  Follow lipid panel.

## 2016-03-30 NOTE — Assessment & Plan Note (Signed)
On thyroid replacement.  Follow tsh.  

## 2016-03-30 NOTE — Assessment & Plan Note (Signed)
Has adjusted her diet.  Lost weight.  Feels good.  a1c just checked 6.5.  Follow met b and a1c.

## 2016-03-30 NOTE — Assessment & Plan Note (Signed)
Blood pressure under good control.  Continue same medication regimen.  Follow pressures.  Follow metabolic panel.   

## 2016-03-30 NOTE — Assessment & Plan Note (Signed)
No acid reflux issues reported.  On protonix.

## 2016-04-16 ENCOUNTER — Telehealth: Payer: Self-pay | Admitting: *Deleted

## 2016-04-16 NOTE — Telephone Encounter (Signed)
Patient just had a TSH done in April, please advise?

## 2016-04-16 NOTE — Telephone Encounter (Signed)
I am ok for her to come in and have her thyroid checked again.  Confirm doesn't feel needs to be seen.  We can get a lab appt scheduled.  Let me know when scheduled so I can let lab know what labs to draw.  Confirm not getting an increased amount of caffeine or using increased stimulants.

## 2016-04-16 NOTE — Telephone Encounter (Signed)
Patient stated that she has started having some hair loss, hot flashes and palpitations from her medication levothyroxine. She questioned if she should have her tyroid level checked. She stated that she has had these symptoms before on a previous medication  [phone]

## 2016-04-16 NOTE — Telephone Encounter (Signed)
Spoke with the patient, she would like to just have the labs done.  I have scheduled her for Tuesday the 20th at 8am.  Thanks.  No increased Caffeine or stimulants, nothing different per the patient.

## 2016-04-21 ENCOUNTER — Other Ambulatory Visit (INDEPENDENT_AMBULATORY_CARE_PROVIDER_SITE_OTHER): Payer: PPO

## 2016-04-21 DIAGNOSIS — N951 Menopausal and female climacteric states: Secondary | ICD-10-CM | POA: Diagnosis not present

## 2016-04-21 DIAGNOSIS — L659 Nonscarring hair loss, unspecified: Secondary | ICD-10-CM

## 2016-04-21 DIAGNOSIS — R232 Flushing: Secondary | ICD-10-CM

## 2016-04-21 LAB — TSH: TSH: 3.26 u[IU]/mL (ref 0.35–4.50)

## 2016-04-22 ENCOUNTER — Encounter: Payer: Self-pay | Admitting: Internal Medicine

## 2016-04-23 ENCOUNTER — Telehealth: Payer: Self-pay | Admitting: Internal Medicine

## 2016-04-23 NOTE — Telephone Encounter (Signed)
Pt called needing a refill for estradiol-norethindrone (COMBIPATCH) 0.05-0.25 MG/DAY.   Pharmacy is [facility] in [place] fax number [phone]. Thank you!

## 2016-04-23 NOTE — Telephone Encounter (Signed)
Refill request for Combipatch, last seen [id number], last filled [place]:[id number].  Please advise.

## 2016-04-24 MED ORDER — ESTRADIOL-NORETHINDRONE ACET 0.05-0.25 MG/DAY TD PTTW: 1.0000 | MEDICATED_PATCH | TRANSDERMAL | Status: DC

## 2016-04-24 NOTE — Telephone Encounter (Signed)
Refilled combipatch #24 with one refill.

## 2016-04-24 NOTE — Telephone Encounter (Signed)
Called Rx into pharmacy.  

## 2016-05-06 MED ORDER — ESTRADIOL-NORETHINDRONE ACET 0.05-0.25 MG/DAY TD PTTW: 1.0000 | MEDICATED_PATCH | TRANSDERMAL | Status: DC

## 2016-05-06 NOTE — Telephone Encounter (Signed)
Pt states that she needs this sent to [facility] not [facility]..  Pt states that she is also out of this rx Pt called needing a refill for estradiol-norethindrone (COMBIPATCH) 0.05-0.25 MG/DAY.  Pharmacy is [facility] in [place] fax number [phone]. Thank you!

## 2016-05-06 NOTE — Addendum Note (Signed)
Addended by: Bevelyn Ngo on: 05/06/2016 09:27 AM   Modules accepted: Orders

## 2016-05-06 NOTE — Telephone Encounter (Signed)
Pt called with a alternate fax number [phone].

## 2016-05-06 NOTE — Telephone Encounter (Signed)
Faxed to both numbers for patient. thanks

## 2016-05-06 NOTE — Telephone Encounter (Signed)
Reprinted and given to Dr. Nicki Reaper to sign, then will fax to [facility]. Thanks

## 2016-07-14 ENCOUNTER — Encounter: Payer: Self-pay | Admitting: Internal Medicine

## 2016-07-14 ENCOUNTER — Other Ambulatory Visit (INDEPENDENT_AMBULATORY_CARE_PROVIDER_SITE_OTHER): Payer: PPO

## 2016-07-14 DIAGNOSIS — E7800 Pure hypercholesterolemia, unspecified: Secondary | ICD-10-CM | POA: Diagnosis not present

## 2016-07-14 DIAGNOSIS — E119 Type 2 diabetes mellitus without complications: Secondary | ICD-10-CM | POA: Diagnosis not present

## 2016-07-14 DIAGNOSIS — I10 Essential (primary) hypertension: Secondary | ICD-10-CM | POA: Diagnosis not present

## 2016-07-14 LAB — LIPID PANEL
CHOL/HDL RATIO: 7
Cholesterol: 243 mg/dL — ABNORMAL HIGH (ref 0–200)
HDL: 33.7 mg/dL — ABNORMAL LOW (ref >39.00)
NonHDL: 209.66
Triglycerides: 334 mg/dL — ABNORMAL HIGH (ref 0.0–149.0)
VLDL: 66.8 mg/dL — AB (ref 0.0–40.0)

## 2016-07-14 LAB — HEPATIC FUNCTION PANEL
ALBUMIN: 4.2 g/dL (ref 3.5–5.2)
ALK PHOS: 74 U/L (ref 39–117)
ALT: 14 U/L (ref 0–35)
AST: 15 U/L (ref 0–37)
Bilirubin, Direct: 0.1 mg/dL (ref 0.0–0.3)
Total Bilirubin: 1.1 mg/dL (ref 0.2–1.2)
Total Protein: 6.8 g/dL (ref 6.0–8.3)

## 2016-07-14 LAB — BASIC METABOLIC PANEL
BUN: 18 mg/dL (ref 6–23)
CALCIUM: 9.5 mg/dL (ref 8.4–10.5)
CO2: 32 mEq/L (ref 19–32)
Chloride: 104 mEq/L (ref 96–112)
Creatinine, Ser: 0.74 mg/dL (ref 0.40–1.20)
GFR: 81.69 mL/min (ref >60.00)
Glucose, Bld: 147 mg/dL — ABNORMAL HIGH (ref 70–99)
POTASSIUM: 4.3 meq/L (ref 3.5–5.1)
SODIUM: 140 meq/L (ref 135–145)

## 2016-07-14 LAB — LDL CHOLESTEROL, DIRECT: Direct LDL: 122 mg/dL

## 2016-07-14 LAB — HEMOGLOBIN A1C: HEMOGLOBIN A1C: 6.4 % (ref 4.6–6.5)

## 2016-07-21 ENCOUNTER — Other Ambulatory Visit: Payer: PPO

## 2016-07-27 ENCOUNTER — Ambulatory Visit (INDEPENDENT_AMBULATORY_CARE_PROVIDER_SITE_OTHER): Payer: PPO | Admitting: Internal Medicine

## 2016-07-27 ENCOUNTER — Encounter: Payer: Self-pay | Admitting: Internal Medicine

## 2016-07-27 VITALS — BP 130/78 | HR 71 | Temp 98.2°F | Ht 62.0 in | Wt 139.2 lb

## 2016-07-27 DIAGNOSIS — Z0000 Encounter for general adult medical examination without abnormal findings: Secondary | ICD-10-CM

## 2016-07-27 DIAGNOSIS — L989 Disorder of the skin and subcutaneous tissue, unspecified: Secondary | ICD-10-CM

## 2016-07-27 DIAGNOSIS — E039 Hypothyroidism, unspecified: Secondary | ICD-10-CM

## 2016-07-27 DIAGNOSIS — Z1239 Encounter for other screening for malignant neoplasm of breast: Secondary | ICD-10-CM

## 2016-07-27 DIAGNOSIS — Z23 Encounter for immunization: Secondary | ICD-10-CM

## 2016-07-27 DIAGNOSIS — E119 Type 2 diabetes mellitus without complications: Secondary | ICD-10-CM

## 2016-07-27 DIAGNOSIS — E7800 Pure hypercholesterolemia, unspecified: Secondary | ICD-10-CM

## 2016-07-27 DIAGNOSIS — I10 Essential (primary) hypertension: Secondary | ICD-10-CM

## 2016-07-27 DIAGNOSIS — K219 Gastro-esophageal reflux disease without esophagitis: Secondary | ICD-10-CM

## 2016-07-27 NOTE — Assessment & Plan Note (Addendum)
Blood pressure on my check initially elevated.  Recheck blood pressure 148/72.  She states blood pressure averages 135/70-80.  Have her spot check her pressure.  Get her back in soon to reassess.

## 2016-07-27 NOTE — Assessment & Plan Note (Signed)
She is up to date with eye checks.  Low carb diet and exercise.  Recent a1c 6.4.  Follow met b and a1c.

## 2016-07-27 NOTE — Assessment & Plan Note (Signed)
On thyroid replacement.  Follow tsh.  TSH wnl 04/2016.

## 2016-07-27 NOTE — Progress Notes (Signed)
Pre visit review using our clinic review tool, if applicable. No additional management support is needed unless otherwise documented below in the visit note. 

## 2016-07-27 NOTE — Assessment & Plan Note (Signed)
Triglycerides elevated on recent check.  Discussed diet and exercise.  Discussed low carb diet.  She is intolerant of statin medication.  Follow lipid panel.

## 2016-07-27 NOTE — Assessment & Plan Note (Signed)
On protonix.  No acid reflux problems reported.

## 2016-07-27 NOTE — Progress Notes (Signed)
Patient ID: Mariah Moody, female   DOB: 02-06-1941, 75 y.o.   MRN: [id number]   Subjective:    Patient ID: Mariah Moody, female    DOB: Dec 16, 1940, 75 y.o.   MRN: [id number]  HPI  Patient with past history of CAD, diabetes, GERD, hypertension and hypercholesterolemia.  She comes in today to follow up on these issues.   She states she is doing well.  Stays active.  Not walking as much.  Has been eating more carbs over the summer.  Triglycerides elevated.  Discussed with her today.  No chest pain.  No sob.  No acid reflux.  No abdominal pain or cramping.  Bowels stable.  Reports splinter in right first finger.  Unable to visualize.  Raised lesion.  Will have dermatology evaluate.     Past Medical History:  Diagnosis Date  . Anemia   . Coronary atherosclerosis of native coronary vessel   . Diabetes mellitus without complication ([place])   . Fibroid   . GERD (gastroesophageal reflux disease)   . History of chicken pox   . Hyperlipidemia   . Hypertension   . Hypothyroidism    Past Surgical History:  Procedure Laterality Date  . COLONOSCOPY N/A 04/15/2015   Procedure: COLONOSCOPY;  Surgeon: Hulen Luster, MD;  Location: [facility] ENDOSCOPY;  Service: Gastroenterology;  Laterality: N/A;  . ESOPHAGOGASTRODUODENOSCOPY N/A 04/15/2015   Procedure: ESOPHAGOGASTRODUODENOSCOPY (EGD);  Surgeon: Hulen Luster, MD;  Location: [facility] ENDOSCOPY;  Service: Gastroenterology;  Laterality: N/A;  . TUBAL LIGATION     Family History  Problem Relation Age of Onset  . Arthritis Mother   . Hypertension Mother   . Colon polyps Mother   . Heart disease Father   . Hypertension Father   . Diabetes Brother   . Cancer Neg Hx    Social History   Social History  . Marital status: Married    Spouse name: N/A  . Number of children: N/A  . Years of education: N/A   Social History Main Topics  . Smoking status: Former Research scientist (life sciences)  . Smokeless tobacco: Never Used  . Alcohol use 0.0 oz/week     Comment: occas  . Drug  use: No  . Sexual activity: Not Currently   Other Topics Concern  . None   Social History Narrative  . None    Outpatient Encounter Prescriptions as of 07/27/2016  Medication Sig  . Calcium Carbonate-Vitamin D (CALCIUM + D PO) Take 1 tablet by mouth daily.  . Cholecalciferol (VITAMIN D PO) Take 1 capsule by mouth daily.   Marland Kitchen estradiol-norethindrone (COMBIPATCH) 0.05-0.25 MG/DAY Place 1 patch onto the skin 2 (two) times a week.  . hydrochlorothiazide (MICROZIDE) 12.5 MG capsule Take 1 capsule by mouth  daily  . levothyroxine (SYNTHROID, LEVOTHROID) 75 MCG tablet Take 1 tablet (75 mcg total) by mouth daily before breakfast.  . losartan-hydrochlorothiazide (HYZAAR) 50-12.5 MG tablet Take 1 tablet by mouth daily.  . Multiple Vitamins-Minerals (MULTIVITAMIN PO) Take 1 tablet by mouth daily.   . pantoprazole (PROTONIX) 40 MG tablet Take 1 tablet by mouth  daily  . [DISCONTINUED] nystatin-triamcinolone (MYCOLOG II) cream Apply 1 application topically 2 (two) times daily.   No facility-administered encounter medications on file as of 07/27/2016.     Review of Systems  Constitutional: Negative for appetite change and unexpected weight change.  HENT: Negative for congestion and sinus pressure.   Respiratory: Negative for cough, chest tightness and shortness of breath.   Cardiovascular: Negative for chest pain,  palpitations and leg swelling.  Gastrointestinal: Negative for abdominal pain, diarrhea, nausea and vomiting.  Genitourinary: Negative for difficulty urinating and dysuria.  Musculoskeletal: Negative for back pain and joint swelling.  Skin: Negative for color change and rash.  Neurological: Negative for dizziness, light-headedness and headaches.  Psychiatric/Behavioral: Negative for agitation and dysphoric mood.       Objective:    Physical Exam  Constitutional: She appears well-developed and well-nourished. No distress.  HENT:  Nose: Nose normal.  Mouth/Throat: Oropharynx is  clear and moist.  Neck: Neck supple. No thyromegaly present.  Cardiovascular: Normal rate and regular rhythm.   Pulmonary/Chest: Breath sounds normal. No respiratory distress. She has no wheezes.  Abdominal: Soft. Bowel sounds are normal. There is no tenderness.  Musculoskeletal: She exhibits no edema or tenderness.  Lymphadenopathy:    She has no cervical adenopathy.  Skin: No rash noted. No erythema.  Raised lesion - first finger.  Minimal tenderness.    Psychiatric: She has a normal mood and affect. Her behavior is normal.    BP 130/78   Pulse 71   Temp 98.2 F (36.8 C) (Oral)   Ht '5\' 2"'  (1.575 m)   Wt 139 lb 3.2 oz (63.1 kg)   SpO2 95%   BMI 25.46 kg/m  Wt Readings from Last 3 Encounters:  07/27/16 139 lb 3.2 oz (63.1 kg)  03/18/16 138 lb 8 oz (62.8 kg)  11/06/15 144 lb 2 oz (65.4 kg)     Lab Results  Component Value Date   WBC 5.2 02/25/2016   HGB 12.9 02/25/2016   HCT 38.3 02/25/2016   PLT 260.0 02/25/2016   GLUCOSE 147 (H) 07/14/2016   CHOL 243 (H) 07/14/2016   TRIG 334.0 (H) 07/14/2016   HDL 33.70 (L) 07/14/2016   LDLDIRECT 122.0 07/14/2016   LDLCALC 135 (H) 02/25/2016   ALT 14 07/14/2016   AST 15 07/14/2016   NA 140 07/14/2016   K 4.3 07/14/2016   CL 104 07/14/2016   CREATININE 0.74 07/14/2016   BUN 18 07/14/2016   CO2 32 07/14/2016   TSH 3.26 04/21/2016   HGBA1C 6.4 07/14/2016   MICROALBUR <0.7 11/06/2015    Mm Screening Breast Tomo Bilateral  Result Date: 08/12/2015 CLINICAL DATA:  Screening. EXAM: DIGITAL SCREENING BILATERAL MAMMOGRAM WITH 3D TOMO WITH CAD COMPARISON:  Previous exam(s). ACR Breast Density Category b: There are scattered areas of fibroglandular density. FINDINGS: There are no findings suspicious for malignancy. Images were processed with CAD. IMPRESSION: No mammographic evidence of malignancy. A result letter of this screening mammogram will be mailed directly to the patient. RECOMMENDATION: Screening mammogram in one year.  (Code:SM-B-01Y) BI-RADS CATEGORY  1: Negative. Electronically Signed   By: Lillia Mountain M.D.   On: 08/12/2015 10:27       Assessment & Plan:   Problem List Items Addressed This Visit    Diabetes mellitus type II, controlled, with no complications ([place])    She is up to date with eye checks.  Low carb diet and exercise.  Recent a1c 6.4.  Follow met b and a1c.       Essential hypertension, benign    Blood pressure on my check initially elevated.  Recheck blood pressure 148/72.  She states blood pressure averages 135/70-80.  Have her spot check her pressure.  Get her back in soon to reassess.        GERD (gastroesophageal reflux disease)    On protonix.  No acid reflux problems reported.  Health care maintenance    Physical 11/15/14.  Colonoscopy 04/15/15 - normal.  Mammogram 08/12/15 - Birads I.  She will schedule her own mammogram.        Hypercholesterolemia    Triglycerides elevated on recent check.  Discussed diet and exercise.  Discussed low carb diet.  She is intolerant of statin medication.  Follow lipid panel.        Hypothyroidism    On thyroid replacement.  Follow tsh.  TSH wnl 04/2016.         Other Visit Diagnoses    Finger lesion    -  Primary   right first finger with raised lesion.  states foreign body present.  unable to visualize.  refer to dermatology.  she will make her own appt with Dr Phillip Heal.   Encounter for immunization       Relevant Orders   Flu vaccine HIGH DOSE PF (Completed)   Breast cancer screening       Relevant Orders   MM DIGITAL SCREENING BILATERAL       Einar Pheasant, MD

## 2016-07-27 NOTE — Assessment & Plan Note (Addendum)
Physical 11/15/14.  Colonoscopy 04/15/15 - normal.  Mammogram 08/12/15 - Birads I.  She will schedule her own mammogram.

## 2016-07-28 ENCOUNTER — Encounter: Payer: Self-pay | Admitting: Internal Medicine

## 2016-07-28 ENCOUNTER — Other Ambulatory Visit: Payer: Self-pay | Admitting: Internal Medicine

## 2016-07-29 ENCOUNTER — Encounter: Payer: Self-pay | Admitting: *Deleted

## 2016-08-21 ENCOUNTER — Ambulatory Visit
Admission: RE | Admit: 2016-08-21 | Discharge: 2016-08-21 | Disposition: A | Discharge status: Home / Self Care | Payer: PPO | Source: Ambulatory Visit | Attending: Internal Medicine | Admitting: Internal Medicine

## 2016-08-21 DIAGNOSIS — Z1239 Encounter for other screening for malignant neoplasm of breast: Secondary | ICD-10-CM

## 2016-09-02 ENCOUNTER — Ambulatory Visit
Admission: RE | Admit: 2016-09-02 | Discharge: 2016-09-02 | Disposition: A | Discharge status: Home / Self Care | Payer: PPO | Source: Ambulatory Visit | Attending: Internal Medicine | Admitting: Internal Medicine

## 2016-09-02 ENCOUNTER — Other Ambulatory Visit: Payer: Self-pay | Admitting: Internal Medicine

## 2016-09-02 DIAGNOSIS — Z1231 Encounter for screening mammogram for malignant neoplasm of breast: Secondary | ICD-10-CM | POA: Diagnosis not present

## 2016-09-02 DIAGNOSIS — Z1239 Encounter for other screening for malignant neoplasm of breast: Secondary | ICD-10-CM

## 2016-09-02 IMAGING — MG MM DIGITAL SCREENING BILAT W/ TOMO W/ CAD
8 of 13 series · 8 of 29 positions shown · non-contrast
Comparison: Previous exam(s).

CLINICAL DATA: Screening.

EXAM:
2D DIGITAL SCREENING BILATERAL MAMMOGRAM WITH CAD AND ADJUNCT TOMO

[L MLO (1 of 2)]
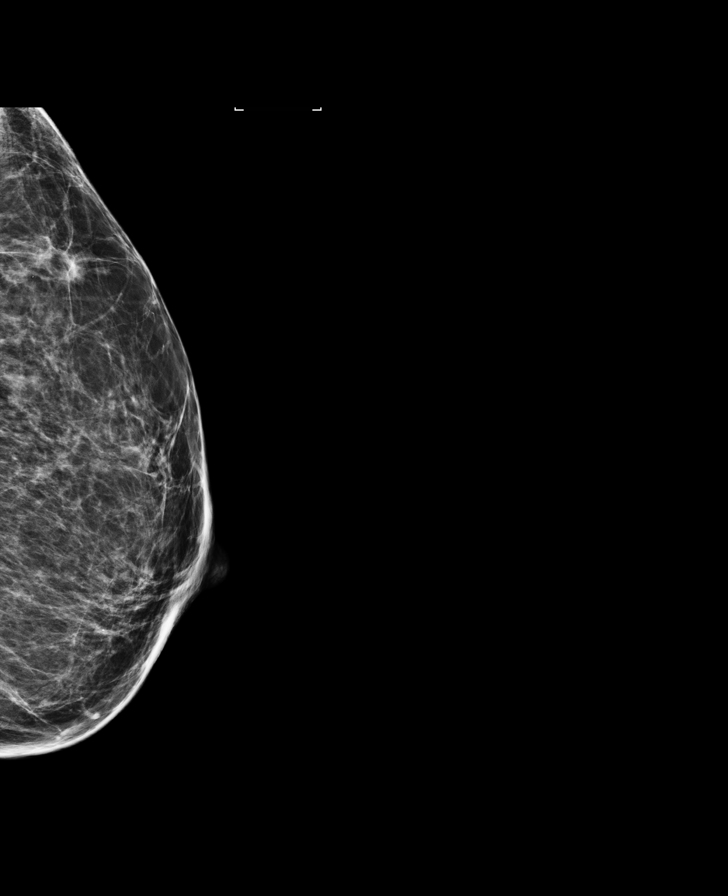

[R MLO]
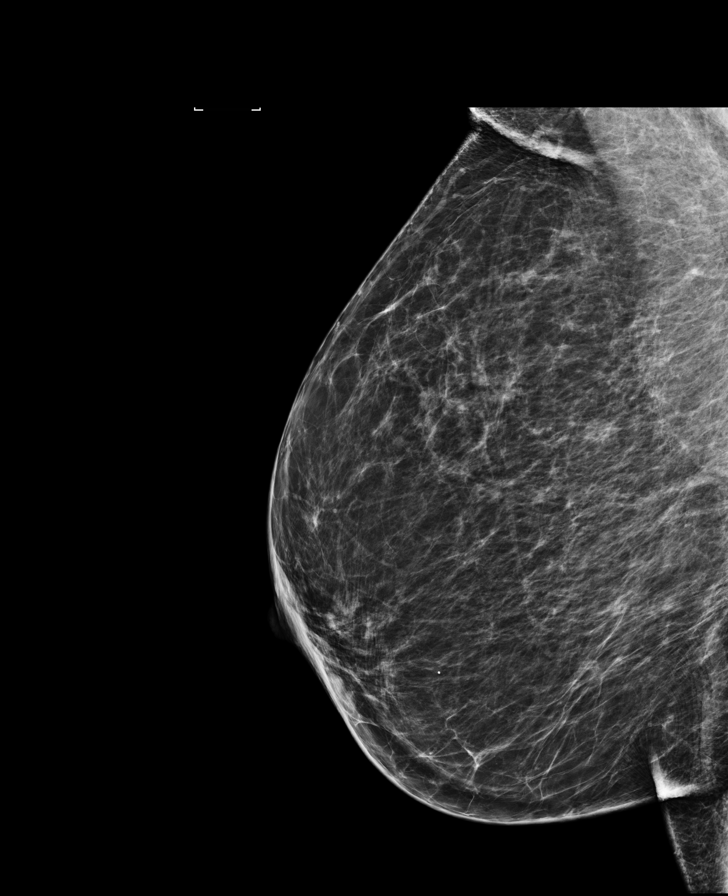

[L MLO (2 of 2)]
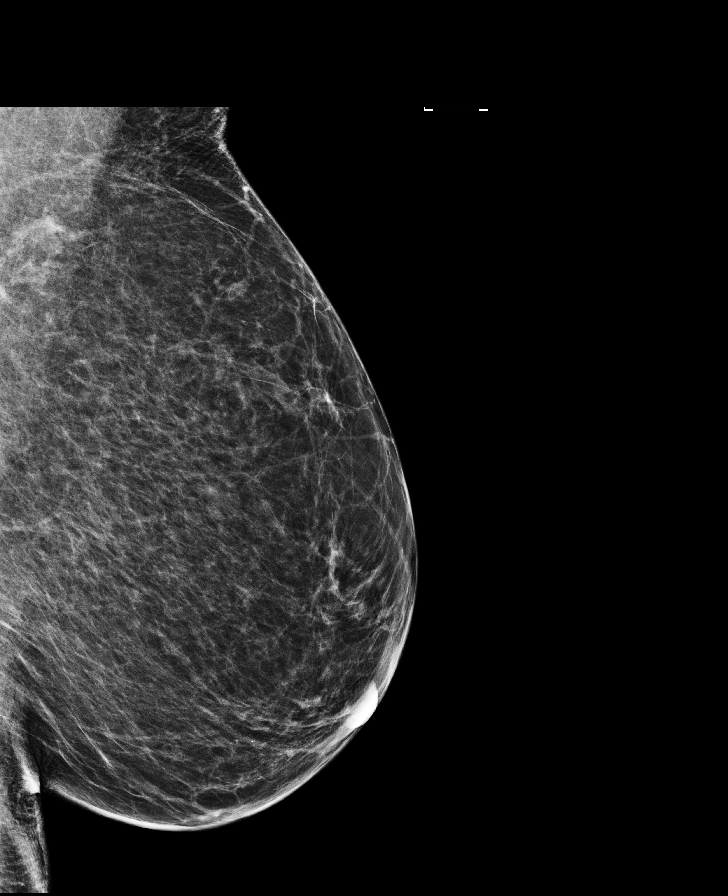

[R CC]
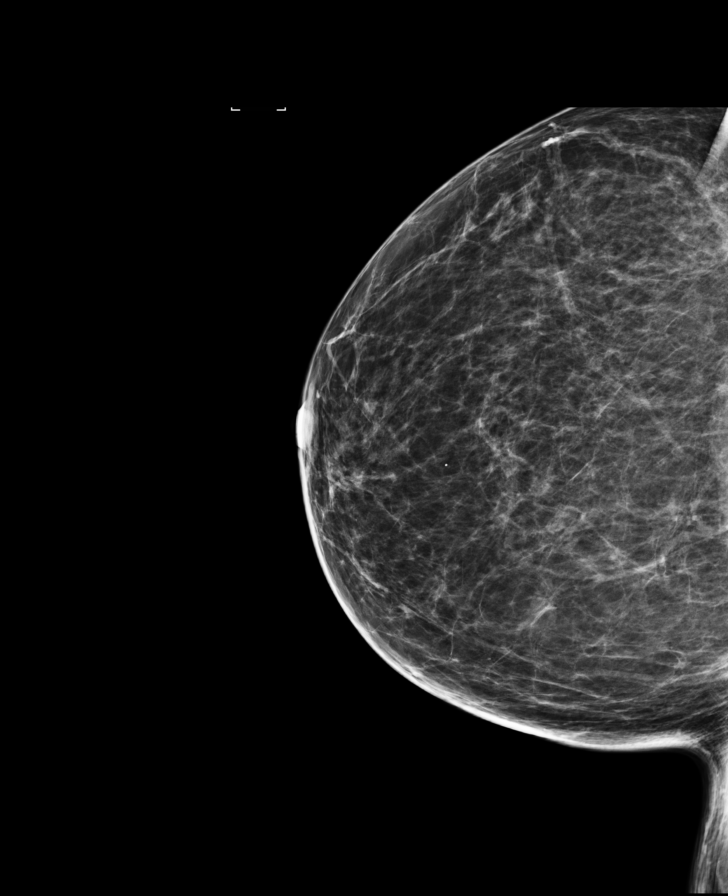

[R CC synth-2D]
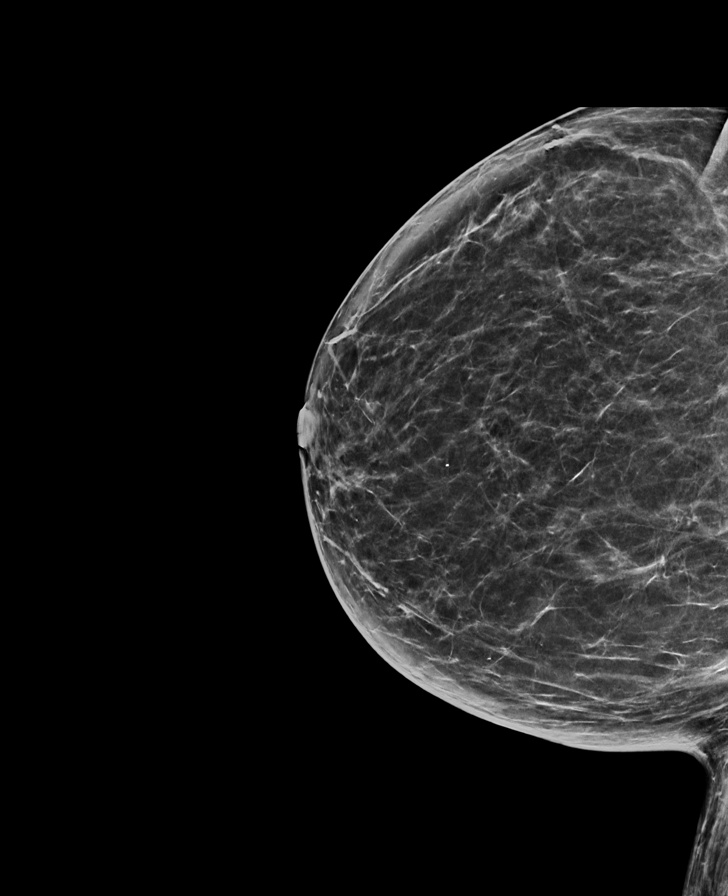

[L CC]
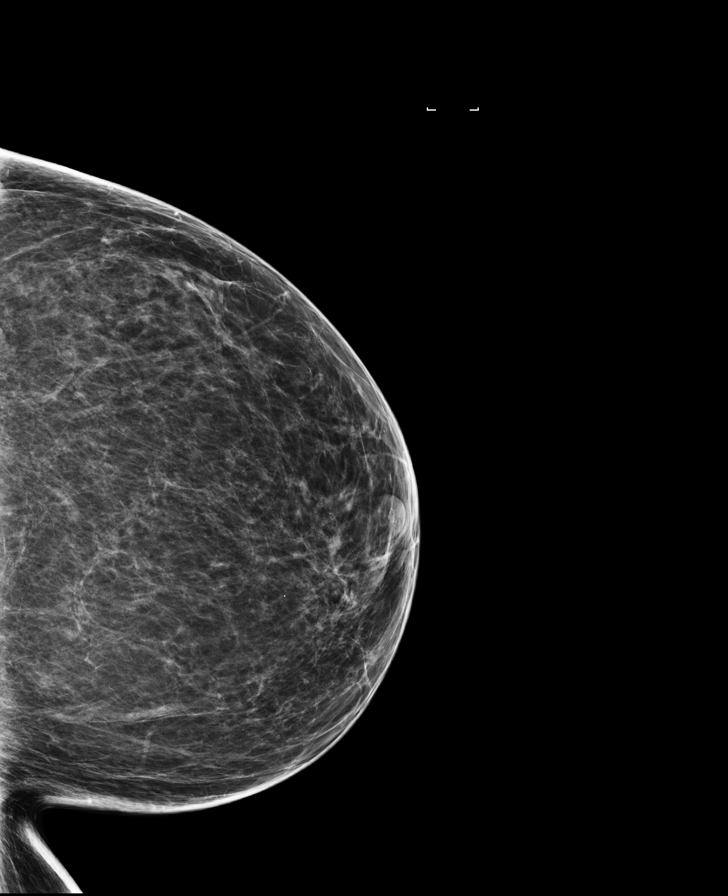

[L CC synth-2D]
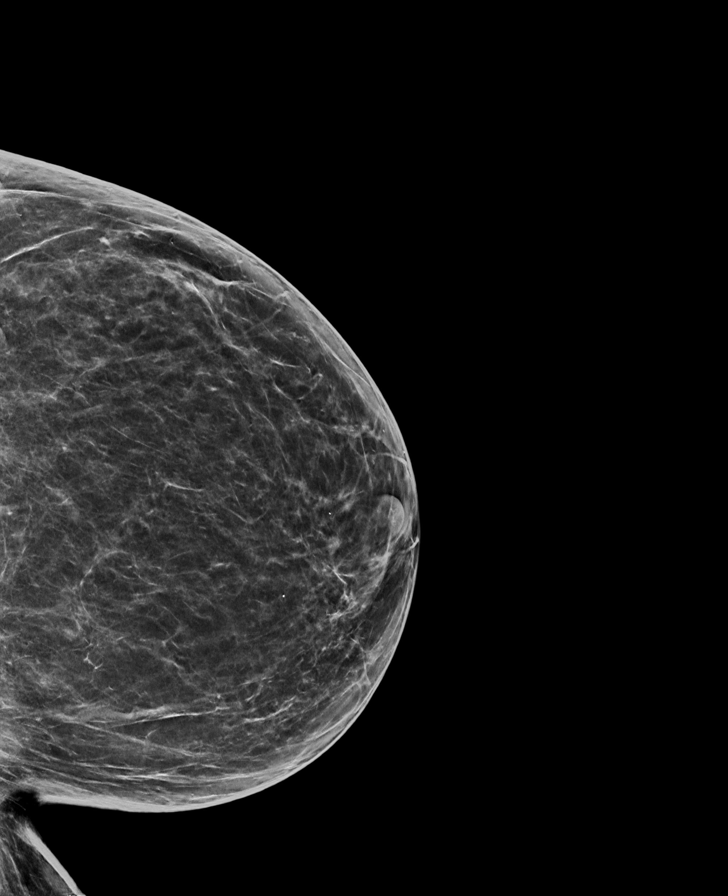

[L MLO synth-2D]
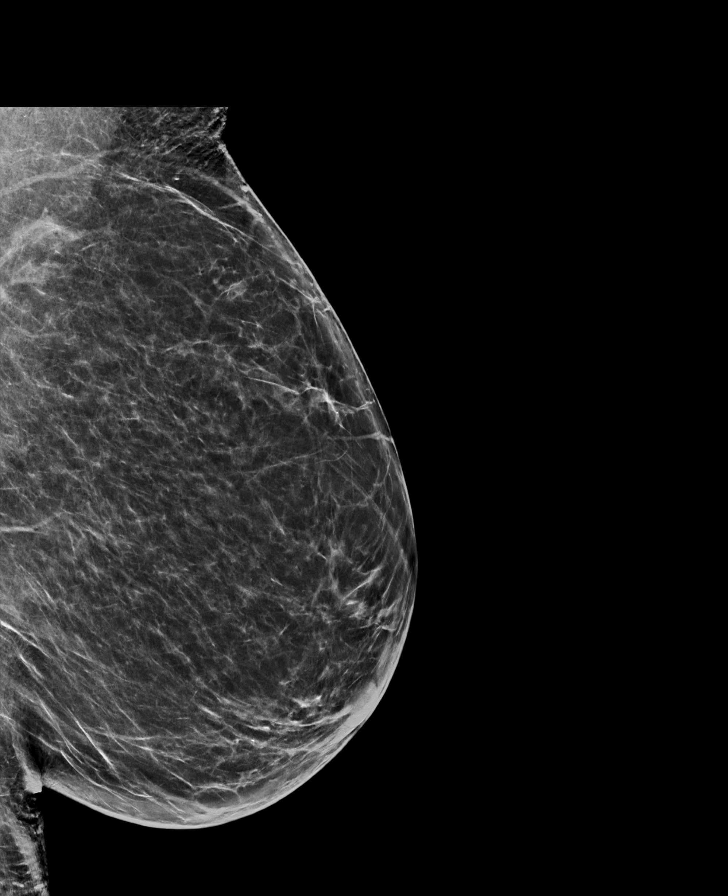

[8 of 29 positions shown; findings below may reference images not displayed]

ACR Breast Density Category b: There are scattered areas of
fibroglandular density.
FINDINGS: There are no findings suspicious for malignancy. Images were
processed with CAD.
IMPRESSION: No mammographic evidence of malignancy. A result letter of this
screening mammogram will be mailed directly to the patient.

RECOMMENDATION:
Screening mammogram in one year. (Code:[33])

BI-RADS CATEGORY  1: Negative.

## 2016-09-14 ENCOUNTER — Encounter: Payer: Self-pay | Admitting: Internal Medicine

## 2016-09-14 ENCOUNTER — Ambulatory Visit (INDEPENDENT_AMBULATORY_CARE_PROVIDER_SITE_OTHER): Payer: PPO | Admitting: Internal Medicine

## 2016-09-14 DIAGNOSIS — I10 Essential (primary) hypertension: Secondary | ICD-10-CM | POA: Diagnosis not present

## 2016-09-14 DIAGNOSIS — E119 Type 2 diabetes mellitus without complications: Secondary | ICD-10-CM

## 2016-09-14 DIAGNOSIS — E7800 Pure hypercholesterolemia, unspecified: Secondary | ICD-10-CM

## 2016-09-14 DIAGNOSIS — K219 Gastro-esophageal reflux disease without esophagitis: Secondary | ICD-10-CM | POA: Diagnosis not present

## 2016-09-14 DIAGNOSIS — E039 Hypothyroidism, unspecified: Secondary | ICD-10-CM

## 2016-09-14 MED ORDER — LOSARTAN POTASSIUM-HCTZ 100-25 MG PO TABS: 1.0000 | ORAL_TABLET | Freq: Every day | ORAL | 2 refills | Status: DC

## 2016-09-14 MED ORDER — ESTRADIOL-NORETHINDRONE ACET 0.05-0.25 MG/DAY TD PTTW: 1.0000 | MEDICATED_PATCH | TRANSDERMAL | 1 refills | Status: DC

## 2016-09-14 NOTE — Assessment & Plan Note (Signed)
Controlled on protonix.   

## 2016-09-14 NOTE — Progress Notes (Signed)
Patient ID: Mariah Moody, female   DOB: Aug 23, 1941, 75 y.o.   MRN: [id number]   Subjective:    Patient ID: Mariah Moody, female    DOB: 12/07/1940, 75 y.o.   MRN: [id number]  HPI  Patient here for a scheduled follow up.  She has been monitoring her blood pressure.  States has been averaging 867-619 systolic readings.  She stays active.  Reports no chest pain or tightness.  Still working.  Some increased stress.  Handling things relatively well.  No sob.  No acid reflux.  No abdominal pain or cramping.  Bowels stable.  Discussed her last labs.  Discussed diet and exercise.     Past Medical History:  Diagnosis Date  . Anemia   . Coronary atherosclerosis of native coronary vessel   . Diabetes mellitus without complication ([place])   . Fibroid   . GERD (gastroesophageal reflux disease)   . History of chicken pox   . Hyperlipidemia   . Hypertension   . Hypothyroidism    Past Surgical History:  Procedure Laterality Date  . COLONOSCOPY N/A 04/15/2015   Procedure: COLONOSCOPY;  Surgeon: Hulen Luster, MD;  Location: [facility] ENDOSCOPY;  Service: Gastroenterology;  Laterality: N/A;  . ESOPHAGOGASTRODUODENOSCOPY N/A 04/15/2015   Procedure: ESOPHAGOGASTRODUODENOSCOPY (EGD);  Surgeon: Hulen Luster, MD;  Location: [facility] ENDOSCOPY;  Service: Gastroenterology;  Laterality: N/A;  . TUBAL LIGATION     Family History  Problem Relation Age of Onset  . Arthritis Mother   . Hypertension Mother   . Colon polyps Mother   . Heart disease Father   . Hypertension Father   . Diabetes Brother   . Cancer Neg Hx    Social History   Social History  . Marital status: Married    Spouse name: N/A  . Number of children: N/A  . Years of education: N/A   Social History Main Topics  . Smoking status: Former Research scientist (life sciences)  . Smokeless tobacco: Never Used  . Alcohol use 0.0 oz/week     Comment: occas  . Drug use: No  . Sexual activity: Not Currently   Other Topics Concern  . None   Social History Narrative    . None    Outpatient Encounter Prescriptions as of 09/14/2016  Medication Sig  . Calcium Carbonate-Vitamin D (CALCIUM + D PO) Take 1 tablet by mouth daily.  . Cholecalciferol (VITAMIN D PO) Take 1 capsule by mouth daily.   Marland Kitchen estradiol-norethindrone (COMBIPATCH) 0.05-0.25 MG/DAY Place 1 patch onto the skin 2 (two) times a week.  . levothyroxine (SYNTHROID, LEVOTHROID) 75 MCG tablet TAKE 1 TABLET (75 MCG TOTAL) BY MOUTH DAILY BEFORE BREAKFAST.  . Multiple Vitamins-Minerals (MULTIVITAMIN PO) Take 1 tablet by mouth daily.   . pantoprazole (PROTONIX) 40 MG tablet Take 1 tablet by mouth  daily  . [DISCONTINUED] estradiol-norethindrone (COMBIPATCH) 0.05-0.25 MG/DAY Place 1 patch onto the skin 2 (two) times a week.  . [DISCONTINUED] hydrochlorothiazide (MICROZIDE) 12.5 MG capsule Take 1 capsule by mouth  daily  . [DISCONTINUED] losartan-hydrochlorothiazide (HYZAAR) 50-12.5 MG tablet Take 1 tablet by mouth daily.  Marland Kitchen losartan-hydrochlorothiazide (HYZAAR) 100-25 MG tablet Take 1 tablet by mouth daily.   No facility-administered encounter medications on file as of 09/14/2016.     Review of Systems  Constitutional: Negative for appetite change and unexpected weight change.  HENT: Negative for congestion and sinus pressure.   Respiratory: Negative for cough, chest tightness and shortness of breath.   Cardiovascular: Negative for chest pain, palpitations and leg  swelling.  Gastrointestinal: Negative for abdominal pain, diarrhea, nausea and vomiting.  Genitourinary: Negative for difficulty urinating and dysuria.  Musculoskeletal: Negative for back pain and joint swelling.  Skin: Negative for color change and rash.  Neurological: Negative for dizziness, light-headedness and headaches.  Psychiatric/Behavioral: Negative for agitation and dysphoric mood.       Objective:     Blood pressure rechecked by me:  152/82  Physical Exam  Constitutional: She appears well-developed and well-nourished. No  distress.  HENT:  Nose: Nose normal.  Mouth/Throat: Oropharynx is clear and moist.  Neck: Neck supple. No thyromegaly present.  Cardiovascular: Normal rate and regular rhythm.   Pulmonary/Chest: Breath sounds normal. No respiratory distress. She has no wheezes.  Abdominal: Soft. Bowel sounds are normal. There is no tenderness.  Musculoskeletal: She exhibits no edema or tenderness.  Lymphadenopathy:    She has no cervical adenopathy.  Skin: No rash noted. No erythema.  Psychiatric: She has a normal mood and affect. Her behavior is normal.    BP (!) 160/80   Pulse 64   Temp 98 F (36.7 C) (Oral)   Ht '5\' 2"'  (1.575 m)   Wt 138 lb 3.2 oz (62.7 kg)   SpO2 96%   BMI 25.28 kg/m  Wt Readings from Last 3 Encounters:  09/14/16 138 lb 3.2 oz (62.7 kg)  07/27/16 139 lb 3.2 oz (63.1 kg)  03/18/16 138 lb 8 oz (62.8 kg)     Lab Results  Component Value Date   WBC 5.2 02/25/2016   HGB 12.9 02/25/2016   HCT 38.3 02/25/2016   PLT 260.0 02/25/2016   GLUCOSE 147 (H) 07/14/2016   CHOL 243 (H) 07/14/2016   TRIG 334.0 (H) 07/14/2016   HDL 33.70 (L) 07/14/2016   LDLDIRECT 122.0 07/14/2016   LDLCALC 135 (H) 02/25/2016   ALT 14 07/14/2016   AST 15 07/14/2016   NA 140 07/14/2016   K 4.3 07/14/2016   CL 104 07/14/2016   CREATININE 0.74 07/14/2016   BUN 18 07/14/2016   CO2 32 07/14/2016   TSH 3.26 04/21/2016   HGBA1C 6.4 07/14/2016   MICROALBUR <0.7 11/06/2015    Mm Screening Breast Tomo Bilateral  Result Date: 09/02/2016 CLINICAL DATA:  Screening. EXAM: 2D DIGITAL SCREENING BILATERAL MAMMOGRAM WITH CAD AND ADJUNCT TOMO COMPARISON:  Previous exam(s). ACR Breast Density Category b: There are scattered areas of fibroglandular density. FINDINGS: There are no findings suspicious for malignancy. Images were processed with CAD. IMPRESSION: No mammographic evidence of malignancy. A result letter of this screening mammogram will be mailed directly to the patient. RECOMMENDATION: Screening  mammogram in one year. (Code:SM-B-01Y) BI-RADS CATEGORY  1: Negative. Electronically Signed   By: Nolon Nations M.D.   On: 09/02/2016 13:35       Assessment & Plan:   Problem List Items Addressed This Visit    Diabetes mellitus type II, controlled, with no complications ([place])    Low carb diet and exercise.  Follow met b and a1c.  a1c last check 6.4.        Relevant Medications   losartan-hydrochlorothiazide (HYZAAR) 100-25 MG tablet   Other Relevant Orders   Microalbumin / creatinine urine ratio   Hemoglobin T7D   Basic metabolic panel   Essential hypertension, benign    Blood pressure elevated.  Her outside checks are elevated.  Will stop losartan/hctz and hctz and start losartan/hctz 100/25 q day.  Follow pressures.  Get her back in soon to reassess.  Have her spot check her  pressure.        Relevant Medications   losartan-hydrochlorothiazide (HYZAAR) 100-25 MG tablet   GERD (gastroesophageal reflux disease)    Controlled on protonix.        Hypercholesterolemia    Low cholesterol diet and exercise.  Follow lipid panel.        Relevant Medications   losartan-hydrochlorothiazide (HYZAAR) 100-25 MG tablet   Other Relevant Orders   Hepatic function panel   Lipid panel   Hypothyroidism    On thyroid medication.  Follow tsh.           Einar Pheasant, MD

## 2016-09-14 NOTE — Progress Notes (Signed)
Pre visit review using our clinic review tool, if applicable. No additional management support is needed unless otherwise documented below in the visit note. 

## 2016-09-14 NOTE — Assessment & Plan Note (Signed)
Low cholesterol diet and exercise.  Follow lipid panel.   

## 2016-09-14 NOTE — Assessment & Plan Note (Signed)
Blood pressure elevated.  Her outside checks are elevated.  Will stop losartan/hctz and hctz and start losartan/hctz 100/25 q day.  Follow pressures.  Get her back in soon to reassess.  Have her spot check her pressure.

## 2016-09-14 NOTE — Patient Instructions (Signed)
Stop hctz 12.5mg   Stop losartan/hctz 50/12.5  Start losartan/hctz 100/25

## 2016-09-14 NOTE — Assessment & Plan Note (Addendum)
Low carb diet and exercise.  Follow met b and a1c.  a1c last check 6.4.

## 2016-09-14 NOTE — Assessment & Plan Note (Signed)
On thyroid medication.  Follow tsh.  

## 2016-11-11 ENCOUNTER — Other Ambulatory Visit: Payer: PPO

## 2016-11-16 ENCOUNTER — Encounter: Payer: PPO | Admitting: Internal Medicine

## 2016-11-25 ENCOUNTER — Telehealth: Payer: Self-pay | Admitting: *Deleted

## 2016-11-25 NOTE — Telephone Encounter (Signed)
Called patient back and she states her blood pressure is running  150/70-80 she is worried about systolic pressure Since increasing  Losartan/HCTZ she has developed muscle aches and cramps in , legs, toes.  Legs have been achy since doubling blood pressure  medication.  Please advise.

## 2016-11-25 NOTE — Telephone Encounter (Signed)
See if she can come in Monday at 10:30.  If needs something prior to this, let us know.

## 2016-11-25 NOTE — Telephone Encounter (Signed)
Will need to recheck her electrolytes and blood pressure.  See if she can come in tomorrow at 2:00 and let me evaluate and do a few labs.  Thanks

## 2016-11-25 NOTE — Telephone Encounter (Signed)
Patient advised of below states she is unable to come in for appointment due to work schedule .   She states she can come in early in the morning Mon,Tues, Wed.  Please advise.

## 2016-11-25 NOTE — Telephone Encounter (Signed)
Pt has concerns about her losartan medication. Pt reported that she hasn't seen much of a change in her blood pressure, and experience cramps at times.  Pt contact [phone]

## 2016-11-26 NOTE — Telephone Encounter (Signed)
Pt scheduled  

## 2016-11-26 NOTE — Telephone Encounter (Signed)
Spoke with patient and she will come in for appointment in Monday will you please add to schedule ? Thanks.

## 2016-11-27 ENCOUNTER — Other Ambulatory Visit: Payer: Self-pay | Admitting: Internal Medicine

## 2016-11-30 ENCOUNTER — Ambulatory Visit (INDEPENDENT_AMBULATORY_CARE_PROVIDER_SITE_OTHER): Payer: PPO | Admitting: Internal Medicine

## 2016-11-30 ENCOUNTER — Encounter: Payer: Self-pay | Admitting: Internal Medicine

## 2016-11-30 VITALS — BP 160/80 | HR 78 | Temp 98.0°F | Resp 18 | Ht 62.0 in | Wt 141.2 lb

## 2016-11-30 DIAGNOSIS — I10 Essential (primary) hypertension: Secondary | ICD-10-CM

## 2016-11-30 DIAGNOSIS — K219 Gastro-esophageal reflux disease without esophagitis: Secondary | ICD-10-CM | POA: Diagnosis not present

## 2016-11-30 DIAGNOSIS — E119 Type 2 diabetes mellitus without complications: Secondary | ICD-10-CM

## 2016-11-30 DIAGNOSIS — M79605 Pain in left leg: Secondary | ICD-10-CM

## 2016-11-30 DIAGNOSIS — M79604 Pain in right leg: Secondary | ICD-10-CM

## 2016-11-30 DIAGNOSIS — E7800 Pure hypercholesterolemia, unspecified: Secondary | ICD-10-CM

## 2016-11-30 LAB — BASIC METABOLIC PANEL
BUN: 28 mg/dL — ABNORMAL HIGH (ref 6–23)
CALCIUM: 9.4 mg/dL (ref 8.4–10.5)
CO2: 26 meq/L (ref 19–32)
CREATININE: 0.79 mg/dL (ref 0.40–1.20)
Chloride: 102 mEq/L (ref 96–112)
GFR: 75.67 mL/min (ref >60.00)
Glucose, Bld: 149 mg/dL — ABNORMAL HIGH (ref 70–99)
Potassium: 4.2 mEq/L (ref 3.5–5.1)
SODIUM: 137 meq/L (ref 135–145)

## 2016-11-30 LAB — SEDIMENTATION RATE: SED RATE: 5 mm/h (ref 0–30)

## 2016-11-30 LAB — CK: CK TOTAL: 163 U/L (ref 7–177)

## 2016-11-30 MED ORDER — LISINOPRIL 40 MG PO TABS: 40.0000 mg | ORAL_TABLET | Freq: Every day | ORAL | 3 refills | Status: DC

## 2016-11-30 NOTE — Patient Instructions (Signed)
Stop losartan/hctz  Start lisinopril 40mg  per day and hctz 25mg  per day.

## 2016-11-30 NOTE — Progress Notes (Signed)
Pre-visit discussion using our clinic review tool. No additional management support is needed unless otherwise documented below in the visit note.  

## 2016-11-30 NOTE — Progress Notes (Signed)
Patient ID: Mariah Moody, female   DOB: 1941-10-27, 76 y.o.   MRN: [id number]   Subjective:    Patient ID: Mariah Moody, female    DOB: May 14, 1941, 76 y.o.   MRN: [id number]  HPI  Patient here as a work in with concerns regarding elevated blood pressure and cramps.  States her blood pressure is elevated - 150-160s/60-70s.  States since increasing the losartan, she has noticed cramps - muscle.  She feels is related to the medication.  No chest pain.  No sob.  No acid reflux.  No abdominal pain or cramping.  Does not feel she is tolerating the medication and does not feel it is working.  No allergic reaction.  Wants to change.  Is on hctz.  Same dose.     Past Medical History:  Diagnosis Date  . Anemia   . Coronary atherosclerosis of native coronary vessel   . Diabetes mellitus without complication ([place])   . Fibroid   . GERD (gastroesophageal reflux disease)   . History of chicken pox   . Hyperlipidemia   . Hypertension   . Hypothyroidism    Past Surgical History:  Procedure Laterality Date  . COLONOSCOPY N/A 04/15/2015   Procedure: COLONOSCOPY;  Surgeon: Hulen Luster, MD;  Location: [facility] ENDOSCOPY;  Service: Gastroenterology;  Laterality: N/A;  . ESOPHAGOGASTRODUODENOSCOPY N/A 04/15/2015   Procedure: ESOPHAGOGASTRODUODENOSCOPY (EGD);  Surgeon: Hulen Luster, MD;  Location: [facility] ENDOSCOPY;  Service: Gastroenterology;  Laterality: N/A;  . TUBAL LIGATION     Family History  Problem Relation Age of Onset  . Arthritis Mother   . Hypertension Mother   . Colon polyps Mother   . Heart disease Father   . Hypertension Father   . Diabetes Brother   . Cancer Neg Hx    Social History   Social History  . Marital status: Married    Spouse name: N/A  . Number of children: N/A  . Years of education: N/A   Social History Main Topics  . Smoking status: Former Research scientist (life sciences)  . Smokeless tobacco: Never Used  . Alcohol use 0.0 oz/week     Comment: occas  . Drug use: No  . Sexual activity:  Not Currently   Other Topics Concern  . None   Social History Narrative  . None    Outpatient Encounter Prescriptions as of 11/30/2016  Medication Sig  . Calcium Carbonate-Vitamin D (CALCIUM + D PO) Take 1 tablet by mouth daily.  . Cholecalciferol (VITAMIN D PO) Take 1 capsule by mouth daily.   Marland Kitchen estradiol-norethindrone (COMBIPATCH) 0.05-0.25 MG/DAY Place 1 patch onto the skin 2 (two) times a week.  . levothyroxine (SYNTHROID, LEVOTHROID) 75 MCG tablet TAKE 1 TABLET (75 MCG TOTAL) BY MOUTH DAILY BEFORE BREAKFAST.  . Multiple Vitamins-Minerals (MULTIVITAMIN PO) Take 1 tablet by mouth daily.   . pantoprazole (PROTONIX) 40 MG tablet TAKE 1 TABLET BY MOUTH DAILY  . [DISCONTINUED] losartan-hydrochlorothiazide (HYZAAR) 100-25 MG tablet Take 1 tablet by mouth daily.  Marland Kitchen lisinopril (PRINIVIL,ZESTRIL) 40 MG tablet Take 1 tablet (40 mg total) by mouth daily.   No facility-administered encounter medications on file as of 11/30/2016.     Review of Systems  Constitutional: Negative for appetite change and unexpected weight change.  HENT: Negative for congestion and sinus pressure.   Respiratory: Negative for cough, chest tightness and shortness of breath.   Cardiovascular: Negative for chest pain, palpitations and leg swelling.  Gastrointestinal: Negative for abdominal pain, diarrhea, nausea and vomiting.  Genitourinary:  Negative for difficulty urinating and dysuria.  Musculoskeletal: Negative for joint swelling.       Muscle cramping as outlined.    Skin: Negative for color change and rash.  Neurological: Negative for dizziness, light-headedness and headaches.  Psychiatric/Behavioral: Negative for agitation and dysphoric mood.       Objective:     Blood pressure rechecked by me:  158/94  Physical Exam  Constitutional: She appears well-developed and well-nourished. No distress.  HENT:  Nose: Nose normal.  Mouth/Throat: Oropharynx is clear and moist.  Neck: Neck supple. No thyromegaly  present.  Cardiovascular: Normal rate and regular rhythm.   Pulmonary/Chest: Breath sounds normal. No respiratory distress. She has no wheezes.  Abdominal: Soft. Bowel sounds are normal. There is no tenderness.  Musculoskeletal: She exhibits no edema or tenderness.  Lymphadenopathy:    She has no cervical adenopathy.  Skin: No rash noted. No erythema.  Psychiatric: She has a normal mood and affect. Her behavior is normal.    BP (!) 160/80 (BP Location: Left Arm, Patient Position: Sitting, Cuff Size: Large)   Pulse 78   Temp 98 F (36.7 C) (Oral)   Resp 18   Ht '5\' 2"'  (1.575 m)   Wt 141 lb 3.2 oz (64 kg)   SpO2 95%   BMI 25.83 kg/m  Wt Readings from Last 3 Encounters:  11/30/16 141 lb 3.2 oz (64 kg)  09/14/16 138 lb 3.2 oz (62.7 kg)  07/27/16 139 lb 3.2 oz (63.1 kg)     Lab Results  Component Value Date   WBC 5.2 02/25/2016   HGB 12.9 02/25/2016   HCT 38.3 02/25/2016   PLT 260.0 02/25/2016   GLUCOSE 149 (H) 11/30/2016   CHOL 243 (H) 07/14/2016   TRIG 334.0 (H) 07/14/2016   HDL 33.70 (L) 07/14/2016   LDLDIRECT 122.0 07/14/2016   LDLCALC 135 (H) 02/25/2016   ALT 14 07/14/2016   AST 15 07/14/2016   NA 137 11/30/2016   K 4.2 11/30/2016   CL 102 11/30/2016   CREATININE 0.79 11/30/2016   BUN 28 (H) 11/30/2016   CO2 26 11/30/2016   TSH 3.26 04/21/2016   HGBA1C 6.4 07/14/2016   MICROALBUR <0.7 11/06/2015    Mm Screening Breast Tomo Bilateral  Result Date: 09/02/2016 CLINICAL DATA:  Screening. EXAM: 2D DIGITAL SCREENING BILATERAL MAMMOGRAM WITH CAD AND ADJUNCT TOMO COMPARISON:  Previous exam(s). ACR Breast Density Category b: There are scattered areas of fibroglandular density. FINDINGS: There are no findings suspicious for malignancy. Images were processed with CAD. IMPRESSION: No mammographic evidence of malignancy. A result letter of this screening mammogram will be mailed directly to the patient. RECOMMENDATION: Screening mammogram in one year. (Code:SM-B-01Y) BI-RADS  CATEGORY  1: Negative. Electronically Signed   By: Nolon Nations M.D.   On: 09/02/2016 13:35       Assessment & Plan:   Problem List Items Addressed This Visit    Diabetes mellitus type II, controlled, with no complications ([place])    Low carb diet and exercise.  Follow met b and a1c.        Relevant Medications   lisinopril (PRINIVIL,ZESTRIL) 40 MG tablet   Essential hypertension, benign    Blood pressure remaining elevated.  She feels having reaction to losartan.  Stop losartan/hctz.  Start lisinopril 58m q day.  Continue hctz 267mq day.  Follow pressures.  See if cramping resolves.  Check metabolic panel.  Get her back in soon to reassess blood pressure.  Relevant Medications   lisinopril (PRINIVIL,ZESTRIL) 40 MG tablet   Other Relevant Orders   Basic metabolic panel (Completed)   GERD (gastroesophageal reflux disease)    Do not eat late.  Protonix.  Follow.        Hypercholesterolemia    Low cholesterol diet and exercise.  Follow lipid panel.        Relevant Medications   lisinopril (PRINIVIL,ZESTRIL) 40 MG tablet    Other Visit Diagnoses    Pain in both lower extremities    -  Primary   Relevant Orders   Sedimentation rate (Completed)   CK (Creatine Kinase) (Completed)       Einar Pheasant, MD

## 2016-12-01 ENCOUNTER — Encounter: Payer: Self-pay | Admitting: Internal Medicine

## 2016-12-06 ENCOUNTER — Encounter: Payer: Self-pay | Admitting: Internal Medicine

## 2016-12-06 NOTE — Assessment & Plan Note (Signed)
Blood pressure remaining elevated.  She feels having reaction to losartan.  Stop losartan/hctz.  Start lisinopril 40mg  q day.  Continue hctz 25mg  q day.  Follow pressures.  See if cramping resolves.  Check metabolic panel.  Get her back in soon to reassess blood pressure.

## 2016-12-06 NOTE — Assessment & Plan Note (Signed)
Low carb diet and exercise.  Follow met b and a1c.   

## 2016-12-06 NOTE — Assessment & Plan Note (Signed)
Do not eat late.  Protonix.  Follow.

## 2016-12-06 NOTE — Assessment & Plan Note (Signed)
Low cholesterol diet and exercise.  Follow lipid panel.   

## 2016-12-13 ENCOUNTER — Other Ambulatory Visit: Payer: Self-pay | Admitting: Internal Medicine

## 2016-12-29 ENCOUNTER — Other Ambulatory Visit: Payer: Self-pay

## 2016-12-29 MED ORDER — LISINOPRIL 40 MG PO TABS: 40.0000 mg | ORAL_TABLET | Freq: Every day | ORAL | 0 refills | Status: DC

## 2017-01-06 ENCOUNTER — Other Ambulatory Visit (INDEPENDENT_AMBULATORY_CARE_PROVIDER_SITE_OTHER): Payer: PPO

## 2017-01-06 DIAGNOSIS — E119 Type 2 diabetes mellitus without complications: Secondary | ICD-10-CM

## 2017-01-06 DIAGNOSIS — E7800 Pure hypercholesterolemia, unspecified: Secondary | ICD-10-CM | POA: Diagnosis not present

## 2017-01-06 LAB — LDL CHOLESTEROL, DIRECT: Direct LDL: 136 mg/dL

## 2017-01-06 LAB — BASIC METABOLIC PANEL
BUN: 19 mg/dL (ref 6–23)
CO2: 31 meq/L (ref 19–32)
CREATININE: 0.75 mg/dL (ref 0.40–1.20)
Calcium: 9.6 mg/dL (ref 8.4–10.5)
Chloride: 103 mEq/L (ref 96–112)
GFR: 80.33 mL/min (ref >60.00)
GLUCOSE: 155 mg/dL — AB (ref 70–99)
Potassium: 4.3 mEq/L (ref 3.5–5.1)
Sodium: 141 mEq/L (ref 135–145)

## 2017-01-06 LAB — LIPID PANEL
CHOLESTEROL: 255 mg/dL — AB (ref 0–200)
HDL: 35.1 mg/dL — AB (ref >39.00)
NonHDL: 220.06
TRIGLYCERIDES: 292 mg/dL — AB (ref 0.0–149.0)
Total CHOL/HDL Ratio: 7
VLDL: 58.4 mg/dL — ABNORMAL HIGH (ref 0.0–40.0)

## 2017-01-06 LAB — MICROALBUMIN / CREATININE URINE RATIO
CREATININE, U: 115.1 mg/dL
MICROALB UR: 0.7 mg/dL (ref 0.0–1.9)
MICROALB/CREAT RATIO: 0.6 mg/g (ref 0.0–30.0)

## 2017-01-06 LAB — HEMOGLOBIN A1C: Hgb A1c MFr Bld: 6.9 % — ABNORMAL HIGH (ref 4.6–6.5)

## 2017-01-06 LAB — HEPATIC FUNCTION PANEL
ALK PHOS: 76 U/L (ref 39–117)
ALT: 11 U/L (ref 0–35)
AST: 12 U/L (ref 0–37)
Albumin: 4.1 g/dL (ref 3.5–5.2)
Bilirubin, Direct: 0.1 mg/dL (ref 0.0–0.3)
Total Bilirubin: 0.6 mg/dL (ref 0.2–1.2)
Total Protein: 6.5 g/dL (ref 6.0–8.3)

## 2017-01-07 ENCOUNTER — Encounter: Payer: Self-pay | Admitting: Internal Medicine

## 2017-01-11 ENCOUNTER — Telehealth: Payer: Self-pay | Admitting: *Deleted

## 2017-01-11 ENCOUNTER — Ambulatory Visit (INDEPENDENT_AMBULATORY_CARE_PROVIDER_SITE_OTHER): Payer: PPO | Admitting: Internal Medicine

## 2017-01-11 ENCOUNTER — Encounter: Payer: PPO | Admitting: Internal Medicine

## 2017-01-11 ENCOUNTER — Encounter: Payer: Self-pay | Admitting: Internal Medicine

## 2017-01-11 ENCOUNTER — Other Ambulatory Visit (HOSPITAL_COMMUNITY)
Admission: RE | Admit: 2017-01-11 | Discharge: 2017-01-11 | Disposition: A | Discharge status: Home / Self Care | Payer: PPO | Source: Ambulatory Visit | Attending: Internal Medicine | Admitting: Internal Medicine

## 2017-01-11 ENCOUNTER — Ambulatory Visit (INDEPENDENT_AMBULATORY_CARE_PROVIDER_SITE_OTHER): Payer: PPO

## 2017-01-11 VITALS — BP 146/78 | HR 72 | Temp 98.3°F | Resp 16 | Ht 62.0 in | Wt 140.2 lb

## 2017-01-11 DIAGNOSIS — E039 Hypothyroidism, unspecified: Secondary | ICD-10-CM | POA: Diagnosis not present

## 2017-01-11 DIAGNOSIS — Z0000 Encounter for general adult medical examination without abnormal findings: Secondary | ICD-10-CM

## 2017-01-11 DIAGNOSIS — K219 Gastro-esophageal reflux disease without esophagitis: Secondary | ICD-10-CM | POA: Diagnosis not present

## 2017-01-11 DIAGNOSIS — R05 Cough: Secondary | ICD-10-CM

## 2017-01-11 DIAGNOSIS — Z01419 Encounter for gynecological examination (general) (routine) without abnormal findings: Secondary | ICD-10-CM | POA: Diagnosis not present

## 2017-01-11 DIAGNOSIS — E7800 Pure hypercholesterolemia, unspecified: Secondary | ICD-10-CM | POA: Diagnosis not present

## 2017-01-11 DIAGNOSIS — R059 Cough, unspecified: Secondary | ICD-10-CM

## 2017-01-11 DIAGNOSIS — I10 Essential (primary) hypertension: Secondary | ICD-10-CM | POA: Diagnosis not present

## 2017-01-11 DIAGNOSIS — Z1151 Encounter for screening for human papillomavirus (HPV): Secondary | ICD-10-CM | POA: Diagnosis not present

## 2017-01-11 DIAGNOSIS — Z124 Encounter for screening for malignant neoplasm of cervix: Secondary | ICD-10-CM | POA: Diagnosis not present

## 2017-01-11 DIAGNOSIS — E119 Type 2 diabetes mellitus without complications: Secondary | ICD-10-CM

## 2017-01-11 IMAGING — DX DG CHEST 2V
2 series · 2 of 2 positions shown · non-contrast
Comparison: None.

CLINICAL DATA: Cough

EXAM:
CHEST  2 VIEW

[chest pa]
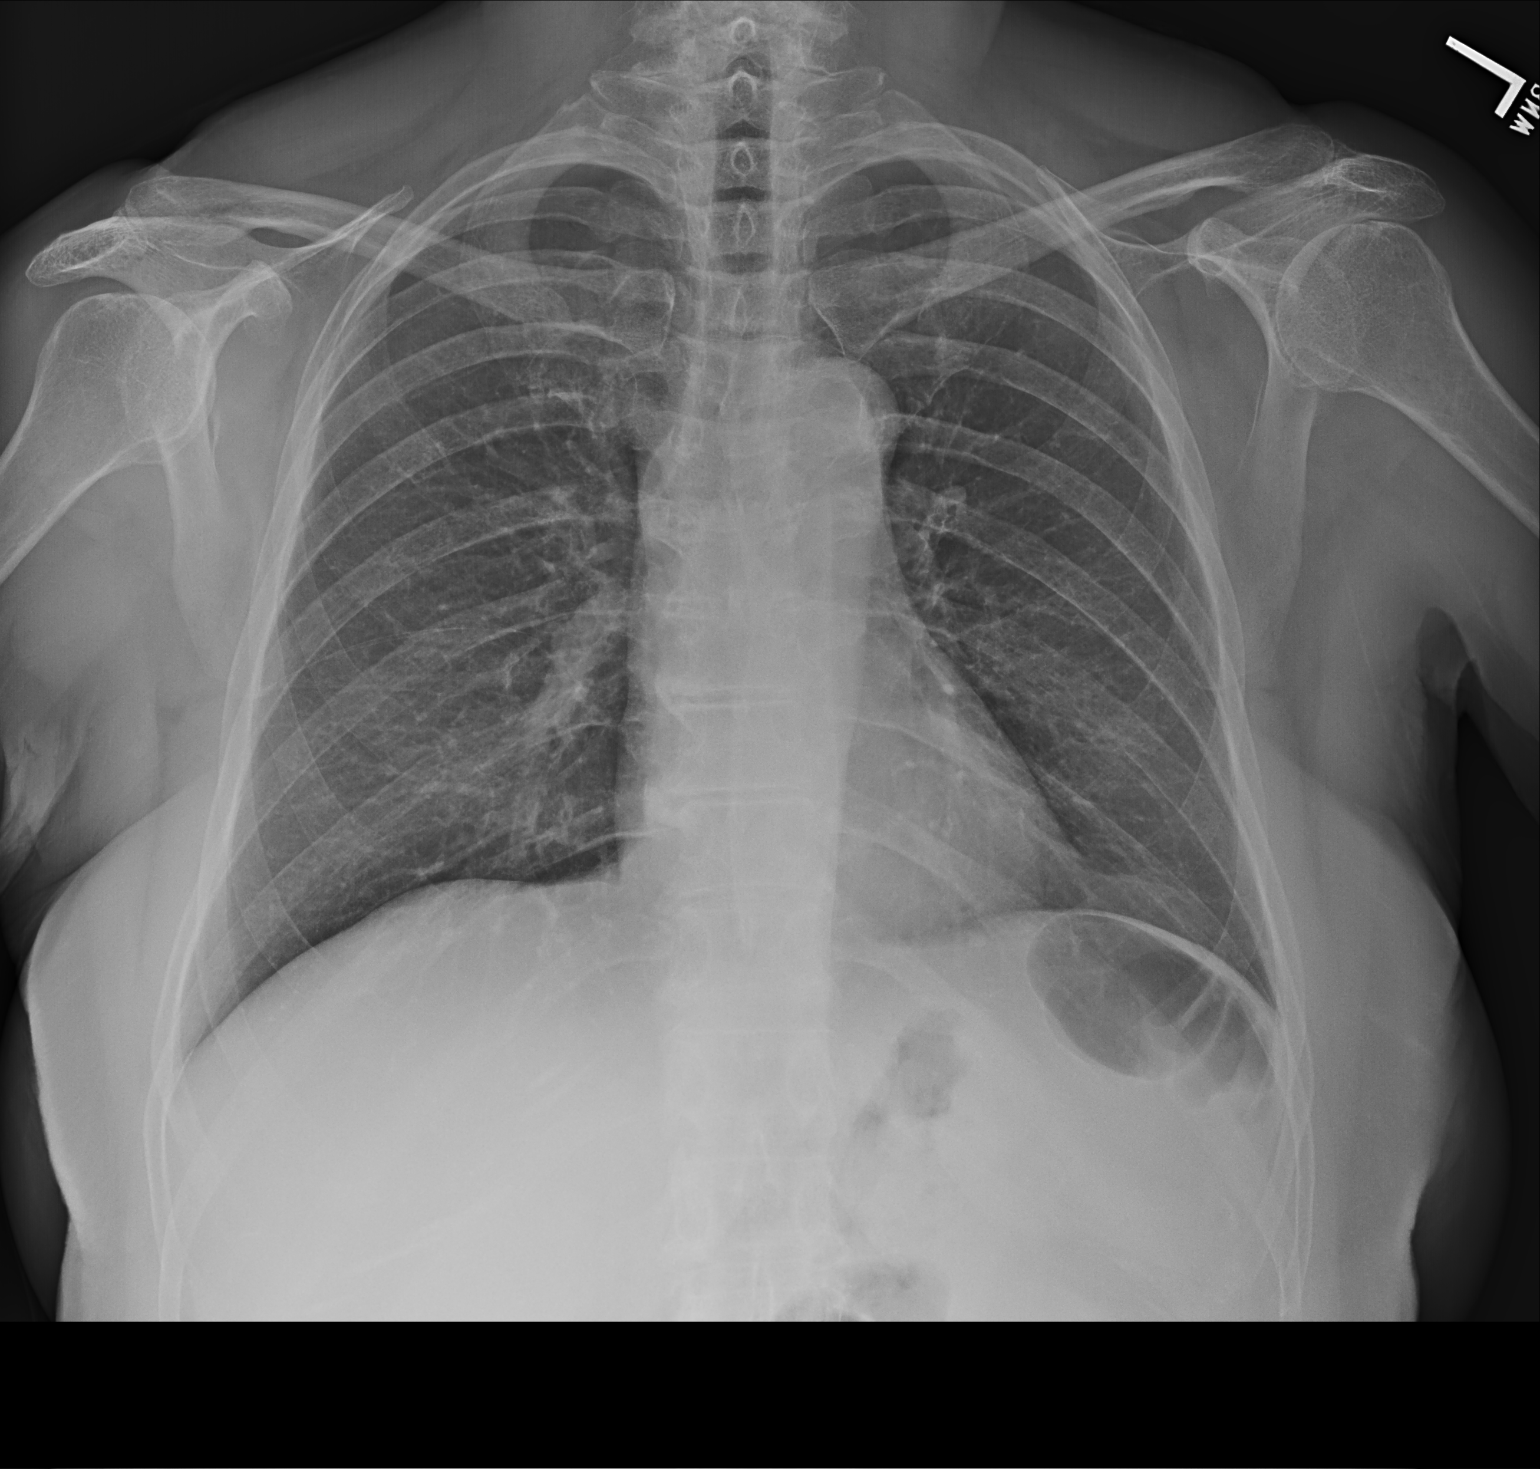

[chest lat]
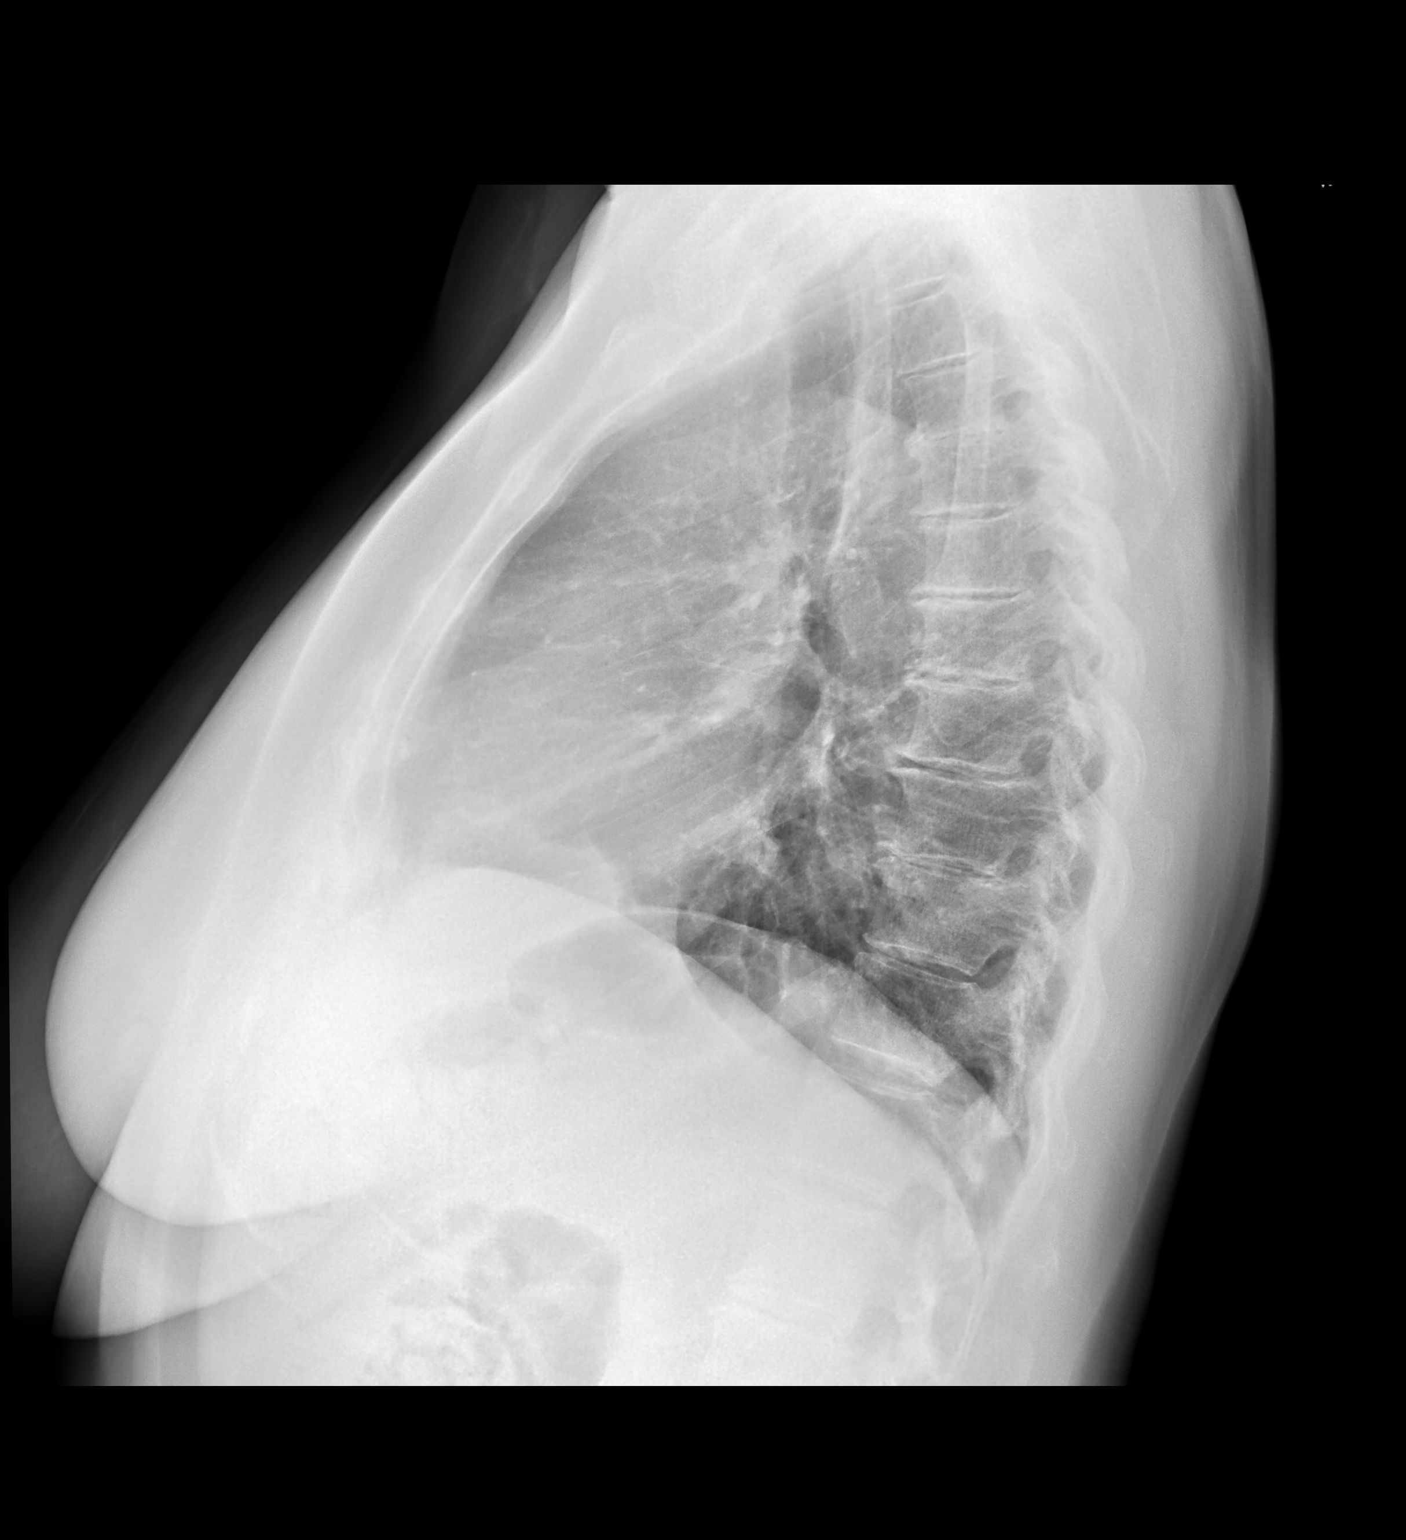

[2 of 2 positions shown; findings below may reference images not displayed]

FINDINGS: Heart and mediastinal contours are within normal limits. No focal
opacities or effusions. No acute bony abnormality.
IMPRESSION: No active cardiopulmonary disease.

## 2017-01-11 MED ORDER — LEVOTHYROXINE SODIUM 75 MCG PO TABS: 75.0000 ug | ORAL_TABLET | Freq: Every day | ORAL | 1 refills | Status: DC

## 2017-01-11 MED ORDER — AMLODIPINE BESYLATE 5 MG PO TABS: 5.0000 mg | ORAL_TABLET | Freq: Every day | ORAL | 2 refills | Status: DC

## 2017-01-11 MED ORDER — BENZONATATE 100 MG PO CAPS: 100.0000 mg | ORAL_CAPSULE | Freq: Three times a day (TID) | ORAL | 0 refills | Status: DC | PRN

## 2017-01-11 NOTE — Assessment & Plan Note (Addendum)
Physical today 01/11/17.  PAP 01/11/17 - at pts request.  Colonoscopy 04/15/15 - normal.  Mammogram 09/02/16 - Birads I.

## 2017-01-11 NOTE — Patient Instructions (Signed)
Saline nasal spray - flush nose at least 2-3x/day  nasacort nasal spray - 2 sprays each nostril one time per day.  Do this in the evening.  

## 2017-01-11 NOTE — Progress Notes (Signed)
Patient ID: Mariah Moody, female   DOB: 1941-02-26, 76 y.o.   MRN: [id number]   Subjective:    Patient ID: Mariah Moody, female    DOB: Sep 01, 1941, 76 y.o.   MRN: [id number]  HPI  Patient with past history of CAD, diabetes, GERD, hypertension and hypercholesterolemia.  She comes in today to follow up on these issues as well as for a complete physical exam.  She was placed on lisinopril last visit.  Reports persistent cough since 11/2016.  Worse when lies down.  Affecting her sleep.  Runny nose with some sinus pressure and congestion.  Occasional nausea.  No acid reflux.  No fever.  No sore throat.  No chest congestion.  No chest pain.  Breathing stable.  No abdominal pain.  Bowels stable.  States her blood pressure has been averaging 400-867Y systolic readings.     Past Medical History:  Diagnosis Date  . Anemia   . Coronary atherosclerosis of native coronary vessel   . Diabetes mellitus without complication ([place])   . Fibroid   . GERD (gastroesophageal reflux disease)   . History of chicken pox   . Hyperlipidemia   . Hypertension   . Hypothyroidism    Past Surgical History:  Procedure Laterality Date  . COLONOSCOPY N/A 04/15/2015   Procedure: COLONOSCOPY;  Surgeon: Hulen Luster, MD;  Location: [facility] ENDOSCOPY;  Service: Gastroenterology;  Laterality: N/A;  . ESOPHAGOGASTRODUODENOSCOPY N/A 04/15/2015   Procedure: ESOPHAGOGASTRODUODENOSCOPY (EGD);  Surgeon: Hulen Luster, MD;  Location[facility] ENDOSCOPY;  Service: Gastroenterology;  Laterality: N/A;  . TUBAL LIGATION     Family History  Problem Relation Age of Onset  . Arthritis Mother   . Hypertension Mother   . Colon polyps Mother   . Heart disease Father   . Hypertension Father   . Diabetes Brother   . Cancer Neg Hx    Social History   Social History  . Marital status: Married    Spouse name: N/A  . Number of children: N/A  . Years of education: N/A   Social History Main Topics  . Smoking status: Former Research scientist (life sciences)  .  Smokeless tobacco: Never Used  . Alcohol use 0.0 oz/week     Comment: occas  . Drug use: No  . Sexual activity: Not Currently   Other Topics Concern  . None   Social History Narrative  . None    Outpatient Encounter Prescriptions as of 01/11/2017  Medication Sig  . Calcium Carbonate-Vitamin D (CALCIUM + D PO) Take 1 tablet by mouth daily.  . Cholecalciferol (VITAMIN D PO) Take 1 capsule by mouth daily.   Marland Kitchen estradiol-norethindrone (COMBIPATCH) 0.05-0.25 MG/DAY Place 1 patch onto the skin 2 (two) times a week.  . levothyroxine (SYNTHROID, LEVOTHROID) 75 MCG tablet Take 1 tablet (75 mcg total) by mouth daily before breakfast.  . Multiple Vitamins-Minerals (MULTIVITAMIN PO) Take 1 tablet by mouth daily.   . pantoprazole (PROTONIX) 40 MG tablet TAKE 1 TABLET BY MOUTH DAILY  . [DISCONTINUED] levothyroxine (SYNTHROID, LEVOTHROID) 75 MCG tablet TAKE 1 TABLET (75 MCG TOTAL) BY MOUTH DAILY BEFORE BREAKFAST.  . [DISCONTINUED] lisinopril (PRINIVIL,ZESTRIL) 40 MG tablet Take 1 tablet (40 mg total) by mouth daily.  . [DISCONTINUED] losartan-hydrochlorothiazide (HYZAAR) 100-25 MG tablet TAKE 1 TABLET BY MOUTH DAILY.  Marland Kitchen amLODipine (NORVASC) 5 MG tablet Take 1 tablet (5 mg total) by mouth daily.  . benzonatate (TESSALON PERLES) 100 MG capsule Take 1 capsule (100 mg total) by mouth 3 (three) times daily  as needed for cough.   No facility-administered encounter medications on file as of 01/11/2017.     Review of Systems  Constitutional: Negative for unexpected weight change.  HENT: Positive for congestion and sinus pressure. Negative for sore throat.   Eyes: Negative for pain and visual disturbance.  Respiratory: Positive for cough. Negative for chest tightness, shortness of breath and wheezing.   Cardiovascular: Negative for chest pain, palpitations and leg swelling.  Gastrointestinal: Negative for abdominal pain, diarrhea, nausea and vomiting.  Genitourinary: Negative for difficulty urinating and  dysuria.  Musculoskeletal: Negative for back pain and joint swelling.  Skin: Negative for color change and rash.  Neurological: Negative for dizziness, light-headedness and headaches.  Hematological: Negative for adenopathy. Does not bruise/bleed easily.  Psychiatric/Behavioral: Negative for agitation and dysphoric mood.       Objective:     Blood pressure rechecked by me:  156/86  Physical Exam  Constitutional: She is oriented to person, place, and time. She appears well-developed and well-nourished. No distress.  HENT:  Nose: Nose normal.  Mouth/Throat: Oropharynx is clear and moist.  Eyes: Right eye exhibits no discharge. Left eye exhibits no discharge. No scleral icterus.  Neck: Neck supple. No thyromegaly present.  Cardiovascular: Normal rate and regular rhythm.   Pulmonary/Chest: Breath sounds normal. No accessory muscle usage. No tachypnea. No respiratory distress. She has no decreased breath sounds. She has no wheezes. She has no rhonchi. Right breast exhibits no inverted nipple, no mass, no nipple discharge and no tenderness (no axillary adenopathy). Left breast exhibits no inverted nipple, no mass, no nipple discharge and no tenderness (no axilarry adenopathy).  Abdominal: Soft. Bowel sounds are normal. There is no tenderness.  Genitourinary:  Genitourinary Comments: Normal external genitalia.  Vaginal vault without lesions.  Cervix identified.  Pap smear performed.  Could not appreciate any adnexal masses or tenderness.    Musculoskeletal: She exhibits no edema or tenderness.  Lymphadenopathy:    She has no cervical adenopathy.  Neurological: She is alert and oriented to person, place, and time.  Skin: Skin is warm. No rash noted. No erythema.  Psychiatric: She has a normal mood and affect. Her behavior is normal.    BP (!) 146/78 (BP Location: Left Arm, Patient Position: Sitting, Cuff Size: Normal)   Pulse 72   Temp 98.3 F (36.8 C) (Oral)   Resp 16   Ht '5\' 2"'  (1.575  m)   Wt 140 lb 3.2 oz (63.6 kg)   SpO2 96%   BMI 25.64 kg/m  Wt Readings from Last 3 Encounters:  01/11/17 140 lb 3.2 oz (63.6 kg)  11/30/16 141 lb 3.2 oz (64 kg)  09/14/16 138 lb 3.2 oz (62.7 kg)     Lab Results  Component Value Date   WBC 5.2 02/25/2016   HGB 12.9 02/25/2016   HCT 38.3 02/25/2016   PLT 260.0 02/25/2016   GLUCOSE 155 (H) 01/06/2017   CHOL 255 (H) 01/06/2017   TRIG 292.0 (H) 01/06/2017   HDL 35.10 (L) 01/06/2017   LDLDIRECT 136.0 01/06/2017   LDLCALC 135 (H) 02/25/2016   ALT 11 01/06/2017   AST 12 01/06/2017   NA 141 01/06/2017   K 4.3 01/06/2017   CL 103 01/06/2017   CREATININE 0.75 01/06/2017   BUN 19 01/06/2017   CO2 31 01/06/2017   TSH 3.26 04/21/2016   HGBA1C 6.9 (H) 01/06/2017   MICROALBUR 0.7 01/06/2017    Mm Screening Breast Tomo Bilateral  Result Date: 09/02/2016 CLINICAL DATA:  Screening.  EXAM: 2D DIGITAL SCREENING BILATERAL MAMMOGRAM WITH CAD AND ADJUNCT TOMO COMPARISON:  Previous exam(s). ACR Breast Density Category b: There are scattered areas of fibroglandular density. FINDINGS: There are no findings suspicious for malignancy. Images were processed with CAD. IMPRESSION: No mammographic evidence of malignancy. A result letter of this screening mammogram will be mailed directly to the patient. RECOMMENDATION: Screening mammogram in one year. (Code:SM-B-01Y) BI-RADS CATEGORY  1: Negative. Electronically Signed   By: Nolon Nations M.D.   On: 09/02/2016 13:35       Assessment & Plan:   Problem List Items Addressed This Visit    Cough - Primary    Persistent cough.  Check cxr.  Treat with saline nasal spray and nasacort nasal spray as directed.  Stop lisinopril.  Follow.  Notify me if persistent cough.  Hold abx.       Relevant Orders   DG Chest 2 View (Completed)   Diabetes mellitus type II, controlled, with no complications ([facility])    H6Y 6.9.  Discussed low carb diet and exercise.  Follow met b and a1c.        Essential  hypertension, benign    Blood pressure elevated.  Increased cough.  Feel may be worsened by ace inhibitor.  Had problems with losartan.  Stop lisinopril.  Start amlodipine.  Follow pressures.  Get her back in soon to reassess.        Relevant Medications   amLODipine (NORVASC) 5 MG tablet   GERD (gastroesophageal reflux disease)    protonix controls.  Reports no problems with acid reflux.        Health care maintenance    Physical today 01/11/17.  PAP 01/11/17 - at pts request.  Colonoscopy 04/15/15 - normal.  Mammogram 09/02/16 - Birads I.        Hypercholesterolemia    Intolerant to multiple statin medications.  Declines to restart.  Low cholesterol diet and exercise.  Follow lipid panel.        Relevant Medications   amLODipine (NORVASC) 5 MG tablet   Hypothyroidism    On thyroid replacement therapy.  Follow tsh.       Relevant Medications   levothyroxine (SYNTHROID, LEVOTHROID) 75 MCG tablet    Other Visit Diagnoses    Cervical cancer screening       Relevant Orders   Cytology - PAP       Einar Pheasant, MD

## 2017-01-11 NOTE — Telephone Encounter (Signed)
Please schedule patient to see Dr. Nicki Reaper in 4 weeks Pt Contact [phone]

## 2017-01-11 NOTE — Progress Notes (Signed)
Pre-visit discussion using our clinic review tool. No additional management support is needed unless otherwise documented below in the visit note.  

## 2017-01-12 ENCOUNTER — Encounter: Payer: Self-pay | Admitting: Internal Medicine

## 2017-01-12 DIAGNOSIS — R05 Cough: Secondary | ICD-10-CM | POA: Insufficient documentation

## 2017-01-12 DIAGNOSIS — R059 Cough, unspecified: Secondary | ICD-10-CM | POA: Insufficient documentation

## 2017-01-12 NOTE — Telephone Encounter (Signed)
Lm on vm to schedule 4wk appt,,

## 2017-01-12 NOTE — Assessment & Plan Note (Signed)
a1c 6.9.  Discussed low carb diet and exercise.  Follow met b and a1c.

## 2017-01-12 NOTE — Assessment & Plan Note (Signed)
Blood pressure elevated.  Increased cough.  Feel may be worsened by ace inhibitor.  Had problems with losartan.  Stop lisinopril.  Start amlodipine.  Follow pressures.  Get her back in soon to reassess.

## 2017-01-12 NOTE — Assessment & Plan Note (Signed)
On thyroid replacement therapy.  Follow tsh.

## 2017-01-12 NOTE — Assessment & Plan Note (Signed)
Intolerant to multiple statin medications.  Declines to restart.  Low cholesterol diet and exercise.  Follow lipid panel.

## 2017-01-12 NOTE — Assessment & Plan Note (Signed)
protonix controls.  Reports no problems with acid reflux.

## 2017-01-12 NOTE — Assessment & Plan Note (Signed)
Persistent cough.  Check cxr.  Treat with saline nasal spray and nasacort nasal spray as directed.  Stop lisinopril.  Follow.  Notify me if persistent cough.  Hold abx.

## 2017-01-13 NOTE — Telephone Encounter (Signed)
Pt never opened my chart called l/m with husband to call office.

## 2017-01-14 ENCOUNTER — Telehealth: Payer: Self-pay | Admitting: Internal Medicine

## 2017-01-14 NOTE — Telephone Encounter (Signed)
Pt needs a 4 week follow up with Dr. Nicki Reaper. Pt would like an early morning appt. Please advise, thank you!  Call pt @ [phone]

## 2017-01-15 NOTE — Telephone Encounter (Signed)
Pt scheduled  

## 2017-01-18 LAB — CYTOLOGY - PAP
Diagnosis: NEGATIVE
HPV: NOT DETECTED

## 2017-01-19 ENCOUNTER — Encounter: Payer: Self-pay | Admitting: Internal Medicine

## 2017-01-23 ENCOUNTER — Other Ambulatory Visit: Payer: Self-pay | Admitting: Internal Medicine

## 2017-01-25 ENCOUNTER — Other Ambulatory Visit: Payer: Self-pay | Admitting: Internal Medicine

## 2017-01-26 NOTE — Telephone Encounter (Signed)
Do not see where pt has had filled after 3-17

## 2017-01-27 NOTE — Telephone Encounter (Signed)
Please confirm with pt how she is taking the medication.  Once confirmed, please add to medication list and ok to send in refills.

## 2017-01-28 NOTE — Telephone Encounter (Signed)
Left message to return call to our office.  

## 2017-02-01 NOTE — Telephone Encounter (Signed)
Pt called back returning your call. Thank you!  Call pt @ [phone]

## 2017-02-01 NOTE — Telephone Encounter (Signed)
Pt called and left message returning your call. Thank you!  Call pt @ [phone].

## 2017-02-01 NOTE — Telephone Encounter (Signed)
Left message to return call to our office.  

## 2017-02-09 ENCOUNTER — Telehealth: Payer: Self-pay | Admitting: Internal Medicine

## 2017-02-09 NOTE — Telephone Encounter (Signed)
Pt called needing a refill for amLODipine (NORVASC) 5 MG tablet.  Pharmacy is [id number] - [address] [address], [place]  Call pt @ [phone]. Thank you!

## 2017-02-10 NOTE — Telephone Encounter (Signed)
Called patient she had refill on the medication but she wanted to make sure that you did not want to increase her blood pressure has been running high for the last week or so. She did not have specific readings but sates they have been around 150/84 to 170/87.

## 2017-02-10 NOTE — Telephone Encounter (Signed)
If readings have been running high, I would like to increase her amlodipine to 10mg  q day.  Have her monitor her blood pressure and keep f/u appt.  If problems before her f/u appt, let us know.  Will need new rx sent in for amlodipine after pt notified.  Thanks

## 2017-02-11 NOTE — Telephone Encounter (Signed)
Left message to return call to our office.  

## 2017-02-12 NOTE — Telephone Encounter (Signed)
Left message to return call to our office.  

## 2017-02-15 MED ORDER — AMLODIPINE BESYLATE 10 MG PO TABS: 10.0000 mg | ORAL_TABLET | Freq: Every day | ORAL | 3 refills | Status: DC

## 2017-02-15 NOTE — Telephone Encounter (Signed)
Patient informed will keep check on bp and call if any problems. I have sent in script for Amlodipine 10mg  to pharmacy.

## 2017-02-22 ENCOUNTER — Encounter: Payer: Self-pay | Admitting: Internal Medicine

## 2017-02-22 ENCOUNTER — Ambulatory Visit (INDEPENDENT_AMBULATORY_CARE_PROVIDER_SITE_OTHER): Payer: PPO | Admitting: Internal Medicine

## 2017-02-22 ENCOUNTER — Ambulatory Visit: Payer: PPO | Admitting: Internal Medicine

## 2017-02-22 VITALS — BP 126/64 | HR 75 | Temp 98.7°F | Resp 12 | Ht 62.0 in | Wt 140.8 lb

## 2017-02-22 DIAGNOSIS — E2839 Other primary ovarian failure: Secondary | ICD-10-CM | POA: Diagnosis not present

## 2017-02-22 DIAGNOSIS — R05 Cough: Secondary | ICD-10-CM | POA: Diagnosis not present

## 2017-02-22 DIAGNOSIS — E119 Type 2 diabetes mellitus without complications: Secondary | ICD-10-CM | POA: Diagnosis not present

## 2017-02-22 DIAGNOSIS — I10 Essential (primary) hypertension: Secondary | ICD-10-CM | POA: Diagnosis not present

## 2017-02-22 DIAGNOSIS — E7800 Pure hypercholesterolemia, unspecified: Secondary | ICD-10-CM

## 2017-02-22 DIAGNOSIS — E039 Hypothyroidism, unspecified: Secondary | ICD-10-CM | POA: Diagnosis not present

## 2017-02-22 DIAGNOSIS — R059 Cough, unspecified: Secondary | ICD-10-CM

## 2017-02-22 NOTE — Progress Notes (Signed)
Pre-visit discussion using our clinic review tool. No additional management support is needed unless otherwise documented below in the visit note.  

## 2017-02-22 NOTE — Progress Notes (Signed)
Patient ID: Mariah Moody, female   DOB: 06-23-1941, 76 y.o.   MRN: [id number]   Subjective:    Patient ID: Mariah Moody, female    DOB: [birth year], 76 y.o.   MRN: [id number]  HPI  Patient here for a scheduled follow up. She was having persistent cough.  Was on lisinopril.  This was stopped last visit.  Cough is better/essentially resolved.  On amlodipine now.  Recently increased to 44m q day.  Blood pressure is better.  No chest pain.  No sob.  No acid reflux.  No abdominal pain.  Bowels moving.    Past Medical History:  Diagnosis Date  . Anemia   . Coronary atherosclerosis of native coronary vessel   . Diabetes mellitus without complication ([facility]   . Fibroid   . GERD (gastroesophageal reflux disease)   . History of chicken pox   . Hyperlipidemia   . Hypertension   . Hypothyroidism    Past Surgical History:  Procedure Laterality Date  . COLONOSCOPY N/A 04/15/2015   Procedure: COLONOSCOPY;  Surgeon: PHulen Luster MD;  [facility];  Service: Gastroenterology;  Laterality: N/A;  . ESOPHAGOGASTRODUODENOSCOPY N/A 04/15/2015   Procedure: ESOPHAGOGASTRODUODENOSCOPY (EGD);  Surgeon: PHulen Luster MD;  [facility];  Service: Gastroenterology;  Laterality: N/A;  . TUBAL LIGATION     Family History  Problem Relation Age of Onset  . Arthritis Mother   . Hypertension Mother   . Colon polyps Mother   . Heart disease Father   . Hypertension Father   . Diabetes Brother   . Cancer Neg Hx    Social History   Social History  . Marital status: Married    Spouse name: N/A  . Number of children: N/A  . Years of education: N/A   Social History Main Topics  . Smoking status: Former SResearch scientist (life sciences) . Smokeless tobacco: Never Used  . Alcohol use 0.0 oz/week     Comment: occas  . Drug use: No  . Sexual activity: Not Currently   Other Topics Concern  . None   Social History Narrative  . None    Outpatient Encounter Prescriptions as of 02/22/2017  Medication  Sig  . amLODipine (NORVASC) 10 MG tablet Take 1 tablet (10 mg total) by mouth daily.  . benzonatate (TESSALON PERLES) 100 MG capsule Take 1 capsule (100 mg total) by mouth 3 (three) times daily as needed for cough.  . Calcium Carbonate-Vitamin D (CALCIUM + D PO) Take 1 tablet by mouth daily.  . Cholecalciferol (VITAMIN D PO) Take 1 capsule by mouth daily.   .Marland Kitchenestradiol-norethindrone (COMBIPATCH) 0.05-0.25 MG/DAY Place 1 patch onto the skin 2 (two) times a week.  . hydrochlorothiazide (MICROZIDE) 12.5 MG capsule TAKE 1 CAPSULE BY MOUTH DAILY  . levothyroxine (SYNTHROID, LEVOTHROID) 75 MCG tablet Take 1 tablet (75 mcg total) by mouth daily before breakfast.  . levothyroxine (SYNTHROID, LEVOTHROID) 75 MCG tablet TAKE 1 TABLET (75 MCG TOTAL) BY MOUTH DAILY BEFORE BREAKFAST.  . Multiple Vitamins-Minerals (MULTIVITAMIN PO) Take 1 tablet by mouth daily.   . pantoprazole (PROTONIX) 40 MG tablet TAKE 1 TABLET BY MOUTH DAILY   No facility-administered encounter medications on file as of 02/22/2017.     Review of Systems  Constitutional: Negative for appetite change and unexpected weight change.  HENT: Negative for congestion and sinus pressure.   Respiratory: Negative for cough, chest tightness and shortness of breath.   Cardiovascular: Negative for chest pain, palpitations and leg swelling.  Gastrointestinal: Negative for abdominal pain, diarrhea, nausea and vomiting.  Genitourinary: Negative for difficulty urinating and dysuria.  Musculoskeletal: Negative for back pain and joint swelling.  Skin: Negative for color change and rash.  Neurological: Negative for dizziness, light-headedness and headaches.  Psychiatric/Behavioral: Negative for agitation and dysphoric mood.       Objective:     Blood pressure rechecked by me:  144-146/78  Physical Exam  Constitutional: She appears well-developed and well-nourished. No distress.  HENT:  Nose: Nose normal.  Mouth/Throat: Oropharynx is clear and  moist.  Neck: Neck supple. No thyromegaly present.  Cardiovascular: Normal rate and regular rhythm.   Pulmonary/Chest: Breath sounds normal. No respiratory distress. She has no wheezes.  Abdominal: Soft. Bowel sounds are normal. There is no tenderness.  Musculoskeletal: She exhibits no edema or tenderness.  Lymphadenopathy:    She has no cervical adenopathy.  Skin: No rash noted. No erythema.  Psychiatric: She has a normal mood and affect. Her behavior is normal.    BP 126/64 (BP Location: Left Arm, Patient Position: Sitting, Cuff Size: Normal)   Pulse 75   Temp 98.7 F (37.1 C) (Oral)   Resp 12   Ht '5\' 2"'  (1.575 m)   Wt 140 lb 12.8 oz (63.9 kg)   SpO2 96%   BMI 25.75 kg/m  Wt Readings from Last 3 Encounters:  02/22/17 140 lb 12.8 oz (63.9 kg)  01/11/17 140 lb 3.2 oz (63.6 kg)  11/30/16 141 lb 3.2 oz (64 kg)     Lab Results  Component Value Date   WBC 5.2 02/25/2016   HGB 12.9 02/25/2016   HCT 38.3 02/25/2016   PLT 260.0 02/25/2016   GLUCOSE 155 (H) 01/06/2017   CHOL 255 (H) 01/06/2017   TRIG 292.0 (H) 01/06/2017   HDL 35.10 (L) 01/06/2017   LDLDIRECT 136.0 01/06/2017   LDLCALC 135 (H) 02/25/2016   ALT 11 01/06/2017   AST 12 01/06/2017   NA 141 01/06/2017   K 4.3 01/06/2017   CL 103 01/06/2017   CREATININE 0.75 01/06/2017   BUN 19 01/06/2017   CO2 31 01/06/2017   TSH 3.26 04/21/2016   HGBA1C 6.9 (H) 01/06/2017   MICROALBUR 0.7 01/06/2017    Dg Chest 2 View  Result Date: 01/11/2017 CLINICAL DATA:  Cough EXAM: CHEST  2 VIEW COMPARISON:  None. FINDINGS: Heart and mediastinal contours are within normal limits. No focal opacities or effusions. No acute bony abnormality. IMPRESSION: No active cardiopulmonary disease. Electronically Signed   By: Rolm Baptise M.D.   On: 01/11/2017 12:57       Assessment & Plan:   Problem List Items Addressed This Visit    Cough    Resolved with stopping lisinopril.  Follow.        Diabetes mellitus type II, controlled, with  no complications (HCC)    Low carb diet and exercise.  Follow met b and a1c.        Relevant Orders   Hemoglobin A1c   Essential hypertension, benign    On amlodipine.  Blood pressure is better.  Follow.  Just recently increased the dose.        Relevant Orders   CBC with Differential/Platelet   Basic metabolic panel   Hypercholesterolemia    Intolerant to multiple statins.  Low cholesterol diet and exercise.  Follow lipid panel.   Lab Results  Component Value Date   CHOL 255 (H) 01/06/2017   HDL 35.10 (L) 01/06/2017   LDLCALC 135 (H) 02/25/2016  LDLDIRECT 136.0 01/06/2017   TRIG 292.0 (H) 01/06/2017   CHOLHDL 7 01/06/2017        Relevant Orders   Lipid panel   Hepatic function panel   Hypothyroidism    On thyroid replacement.  Follow tsh.       Relevant Orders   TSH    Other Visit Diagnoses    Estrogen deficiency    -  Primary   Relevant Orders   DG Bone Density       Einar Pheasant, MD

## 2017-03-06 ENCOUNTER — Encounter: Payer: Self-pay | Admitting: Internal Medicine

## 2017-03-06 NOTE — Assessment & Plan Note (Signed)
On thyroid replacement.  Follow tsh.  

## 2017-03-06 NOTE — Assessment & Plan Note (Signed)
Intolerant to multiple statins.  Low cholesterol diet and exercise.  Follow lipid panel.   Lab Results  Component Value Date   CHOL 255 (H) 01/06/2017   HDL 35.10 (L) 01/06/2017   LDLCALC 135 (H) 02/25/2016   LDLDIRECT 136.0 01/06/2017   TRIG 292.0 (H) 01/06/2017   CHOLHDL 7 01/06/2017

## 2017-03-06 NOTE — Assessment & Plan Note (Signed)
Resolved with stopping lisinopril.  Follow.

## 2017-03-06 NOTE — Assessment & Plan Note (Signed)
On amlodipine.  Blood pressure is better.  Follow.  Just recently increased the dose.

## 2017-03-06 NOTE — Assessment & Plan Note (Signed)
Low carb diet and exercise.  Follow met b and a1c.

## 2017-03-08 ENCOUNTER — Telehealth: Payer: Self-pay | Admitting: *Deleted

## 2017-03-08 NOTE — Telephone Encounter (Signed)
Patient reported, since starting the increase in amlodipine, she has had swelling in her feet and ankles, and a rash around her ankles that has gotten better. Patient is sure that this is a result from medication increase.  Pt contact [phone]

## 2017-03-08 NOTE — Telephone Encounter (Signed)
Spoke with patient advised of below.  She states she has bilateral swelling.  She prefers to decrease Norvasc to 1/2 daily. Patient states she stands all day may be reason she has swelling, will try to elevate legs when sitting.  She declines to get evaluation for chest pain she states she has history of costochondritis  I again explained importance of getting evaluation for going to get evaluation for chest pain she stated she would consider but it wasn't severe enough.  She states father has history of heart disease.  I told her that was more reason to go for work up.   She stated if symptoms worsened she would go to ER for further work up.

## 2017-03-08 NOTE — Telephone Encounter (Signed)
Reason for call: swelling in ankles and feets Symptoms: swelling in ankles and feet, red rash better, feels like not urinating enough, trying to increase fluids intake, no shortness of breath, a little of chest pain, comes and goes more toward sternum, already has issue with nerves in sternal area  toward sternunum .  She stated feels like muscle spasm.  She stated if chest pain/sternal pain gets bad enough will seek treatment .   No nausea or vomiting, no arm numbness,  , taken BP 146/70 not bad,  Duration 2 weeks  Medications:amlodipine 10 mg(recent change)  , HCTZ 12.5 2 qd  Last seen for this problem:02/22/17 Seen by:Dr Scott Please advise. Wants me to only route message to you.  Aware you are out office today.

## 2017-03-08 NOTE — Telephone Encounter (Signed)
Need to confirm if the swelling is bilateral.  If so, then the swelling may be coming from the norvasc 10mg  q day.  Would recommend decreasing to 1/2 tablet q day.  Elevate legs when sitting, etc.  If unilateral swelling, needs evaluation especially given chest pain.  Also, would recommend going ahead and with evaluation today given chest pain.  This is new from her previous visit with me.  Need to confirm nothing more acute going on.

## 2017-03-08 NOTE — Telephone Encounter (Signed)
Will need to monitor blood pressure on 1/2 tablet of amlodipine.  May need to add another medication.  Still agree with evaluation if persistent chest pain.

## 2017-03-09 NOTE — Telephone Encounter (Signed)
Called and spoke with patient she stated taking amlodipine 5 mg , denies chest pain, states swelling is better in lower extremites.  Feeling a lot better.  Blood pressure this am 127/67.  She states she went to pharmacy and got print out of side effects and all her side effect were on print out.   She will continue to monitor blood pressure and let us know how she manages.

## 2017-03-22 ENCOUNTER — Telehealth: Payer: Self-pay | Admitting: *Deleted

## 2017-03-22 ENCOUNTER — Other Ambulatory Visit: Payer: Self-pay

## 2017-03-22 MED ORDER — HYDROCHLOROTHIAZIDE 12.5 MG PO CAPS: 12.5000 mg | ORAL_CAPSULE | Freq: Two times a day (BID) | ORAL | 0 refills | Status: DC

## 2017-03-22 NOTE — Telephone Encounter (Signed)
Called patient sates that HCTZ was increased to bid and needs new script called in. I have done so.

## 2017-03-22 NOTE — Telephone Encounter (Signed)
Medication Refill requested for : Hydrochlorothiazide  Pharmacy:[facility] Return Contact :[phone]

## 2017-04-05 ENCOUNTER — Ambulatory Visit: Payer: PPO

## 2017-04-08 ENCOUNTER — Telehealth: Payer: Self-pay | Admitting: Internal Medicine

## 2017-04-08 NOTE — Telephone Encounter (Addendum)
  Reason for call: rash on feet , both feet swelling   Symptoms: both feet swelling rash on feet  Since  Saturday thinks  in correlation to 10 mg of Amlodipine now rash is gone  Duration 3 days  Medications: amlodipine 5 mg 1 daily stopped 3 days ago  , She states she knows amlodipine isn't agreeing with her.   Last seen for this problem:02/22/17 Seen by: BP 177/88 this am , still taking HCTZ , patient states she feels fine , no chest pain, arm numbness, made patient aware that Dr Nicki Reaper is out office ,  Patient wanting to know if she can resume losartan until hears from Dr Nicki Reaper? Advised patient to continue monitor blood pressure if continues to increase especially greater than 160/100 to head to ED.   Please advise

## 2017-04-08 NOTE — Telephone Encounter (Signed)
Pt called and stated that she stopped taking her amLODipine (NORVASC) 10 MG tablet 3 days ago because her feet were swelling and breaking our in a rash. She also states that she is having issues with constipation. Pt's blood pressure this morning was 177/88. Please advise, thank you!  Call pt @ [phone]

## 2017-04-09 ENCOUNTER — Other Ambulatory Visit: Payer: Self-pay | Admitting: Internal Medicine

## 2017-04-09 MED ORDER — VALSARTAN 80 MG PO TABS: 80.0000 mg | ORAL_TABLET | Freq: Every day | ORAL | 1 refills | Status: DC

## 2017-04-09 NOTE — Telephone Encounter (Signed)
Called pt and spoke to her regarding her swelling and blood pressure.  Swelling has resolved.  She is taking hctz 2(12.5mg ) tablets.  Had cramps with losartan.  Cough with lisinopril.  Will start diovan 80mg  q day.  rx sent in.  Pt to monitor blood pressure and follow for side effects.

## 2017-04-09 NOTE — Telephone Encounter (Signed)
Spoke with patient BP 155/77 this am , swelling down in feet since stopping amlodipine.  She is feeling ok, still taking HCTZ.   Please direct wants to know if she can go back on losartan /hctz . Please direct.

## 2017-04-09 NOTE — Telephone Encounter (Signed)
She had reported that she had leg cramps with losartan.  I am ok with restarting losartan.  Monitor blood pressure and follow for side effects.  Call with update.

## 2017-04-12 ENCOUNTER — Telehealth: Payer: Self-pay

## 2017-04-12 ENCOUNTER — Ambulatory Visit
Admission: RE | Admit: 2017-04-12 | Discharge: 2017-04-12 | Disposition: A | Discharge status: Home / Self Care | Payer: PPO | Source: Ambulatory Visit | Attending: Internal Medicine | Admitting: Internal Medicine

## 2017-04-12 DIAGNOSIS — M85851 Other specified disorders of bone density and structure, right thigh: Secondary | ICD-10-CM | POA: Insufficient documentation

## 2017-04-12 DIAGNOSIS — E2839 Other primary ovarian failure: Secondary | ICD-10-CM | POA: Diagnosis not present

## 2017-04-12 NOTE — Telephone Encounter (Signed)
Thank you for checking up on pt.  Blood pressures are better.  Just started medication.  Notify her to continue to follow and let us know if any problems.

## 2017-04-12 NOTE — Telephone Encounter (Signed)
Patient advised of below and verbalized understanding.  

## 2017-04-12 NOTE — Telephone Encounter (Signed)
Left message for patient to call to see how blood pressure is running

## 2017-04-12 NOTE — Telephone Encounter (Signed)
Patient stated that her blood pressure was better, she has 2 reading of  144/71 and 150/76 Pt contact [phone]

## 2017-05-08 ENCOUNTER — Telehealth: Payer: Self-pay | Admitting: Internal Medicine

## 2017-05-08 NOTE — Telephone Encounter (Signed)
Left pt message asking to call Allison back directly at [phone] to schedule AWV. Thanks! °

## 2017-05-25 ENCOUNTER — Other Ambulatory Visit (INDEPENDENT_AMBULATORY_CARE_PROVIDER_SITE_OTHER): Payer: PPO

## 2017-05-25 DIAGNOSIS — E119 Type 2 diabetes mellitus without complications: Secondary | ICD-10-CM

## 2017-05-25 DIAGNOSIS — I10 Essential (primary) hypertension: Secondary | ICD-10-CM | POA: Diagnosis not present

## 2017-05-25 DIAGNOSIS — E7800 Pure hypercholesterolemia, unspecified: Secondary | ICD-10-CM | POA: Diagnosis not present

## 2017-05-25 DIAGNOSIS — E039 Hypothyroidism, unspecified: Secondary | ICD-10-CM | POA: Diagnosis not present

## 2017-05-25 LAB — CBC WITH DIFFERENTIAL/PLATELET
BASOS ABS: 0 10*3/uL (ref 0.0–0.1)
Basophils Relative: 0.4 % (ref 0.0–3.0)
EOS PCT: 2 % (ref 0.0–5.0)
Eosinophils Absolute: 0.1 10*3/uL (ref 0.0–0.7)
HCT: 38.6 % (ref 36.0–46.0)
HEMOGLOBIN: 13 g/dL (ref 12.0–15.0)
Lymphocytes Relative: 27.6 % (ref 12.0–46.0)
Lymphs Abs: 1.8 10*3/uL (ref 0.7–4.0)
MCHC: 33.7 g/dL (ref 30.0–36.0)
MCV: 92.1 fl (ref 78.0–100.0)
MONO ABS: 0.5 10*3/uL (ref 0.1–1.0)
MONOS PCT: 7.7 % (ref 3.0–12.0)
Neutro Abs: 4 10*3/uL (ref 1.4–7.7)
Neutrophils Relative %: 62.3 % (ref 43.0–77.0)
Platelets: 263 10*3/uL (ref 150.0–400.0)
RBC: 4.19 Mil/uL (ref 3.87–5.11)
RDW: 12.7 % (ref 11.5–15.5)
WBC: 6.4 10*3/uL (ref 4.0–10.5)

## 2017-05-25 LAB — HEPATIC FUNCTION PANEL
ALBUMIN: 3.9 g/dL (ref 3.5–5.2)
ALT: 11 U/L (ref 0–35)
AST: 14 U/L (ref 0–37)
Alkaline Phosphatase: 66 U/L (ref 39–117)
Bilirubin, Direct: 0.1 mg/dL (ref 0.0–0.3)
Total Bilirubin: 1 mg/dL (ref 0.2–1.2)
Total Protein: 6.2 g/dL (ref 6.0–8.3)

## 2017-05-25 LAB — HEMOGLOBIN A1C: Hgb A1c MFr Bld: 6.7 % — ABNORMAL HIGH (ref 4.6–6.5)

## 2017-05-25 LAB — BASIC METABOLIC PANEL
BUN: 17 mg/dL (ref 6–23)
CALCIUM: 8.9 mg/dL (ref 8.4–10.5)
CHLORIDE: 105 meq/L (ref 96–112)
CO2: 28 meq/L (ref 19–32)
Creatinine, Ser: 0.74 mg/dL (ref 0.40–1.20)
GFR: 81.5 mL/min (ref >60.00)
Glucose, Bld: 154 mg/dL — ABNORMAL HIGH (ref 70–99)
POTASSIUM: 3.8 meq/L (ref 3.5–5.1)
SODIUM: 140 meq/L (ref 135–145)

## 2017-05-25 LAB — LIPID PANEL
Cholesterol: 207 mg/dL — ABNORMAL HIGH (ref 0–200)
HDL: 33.7 mg/dL — ABNORMAL LOW
NonHDL: 173.64
Total CHOL/HDL Ratio: 6
Triglycerides: 334 mg/dL — ABNORMAL HIGH (ref 0.0–149.0)
VLDL: 66.8 mg/dL — ABNORMAL HIGH (ref 0.0–40.0)

## 2017-05-25 LAB — LDL CHOLESTEROL, DIRECT: Direct LDL: 102 mg/dL

## 2017-05-25 LAB — TSH: TSH: 4.74 u[IU]/mL — ABNORMAL HIGH (ref 0.35–4.50)

## 2017-05-26 ENCOUNTER — Encounter: Payer: Self-pay | Admitting: Internal Medicine

## 2017-05-26 ENCOUNTER — Ambulatory Visit (INDEPENDENT_AMBULATORY_CARE_PROVIDER_SITE_OTHER): Payer: PPO | Admitting: Internal Medicine

## 2017-05-26 VITALS — BP 124/68 | HR 78 | Temp 98.6°F | Resp 12 | Wt 143.0 lb

## 2017-05-26 DIAGNOSIS — E7800 Pure hypercholesterolemia, unspecified: Secondary | ICD-10-CM | POA: Diagnosis not present

## 2017-05-26 DIAGNOSIS — Z23 Encounter for immunization: Secondary | ICD-10-CM | POA: Diagnosis not present

## 2017-05-26 DIAGNOSIS — I10 Essential (primary) hypertension: Secondary | ICD-10-CM | POA: Diagnosis not present

## 2017-05-26 DIAGNOSIS — E119 Type 2 diabetes mellitus without complications: Secondary | ICD-10-CM

## 2017-05-26 DIAGNOSIS — K219 Gastro-esophageal reflux disease without esophagitis: Secondary | ICD-10-CM | POA: Diagnosis not present

## 2017-05-26 DIAGNOSIS — E039 Hypothyroidism, unspecified: Secondary | ICD-10-CM | POA: Diagnosis not present

## 2017-05-26 MED ORDER — ZOSTER VAC RECOMB ADJUVANTED 50 MCG/0.5ML IM SUSR: 0.5000 mL | Freq: Once | INTRAMUSCULAR | 0 refills | Status: AC

## 2017-05-26 MED ORDER — IRBESARTAN 150 MG PO TABS: 150.0000 mg | ORAL_TABLET | Freq: Every day | ORAL | 2 refills | Status: DC

## 2017-05-26 MED ORDER — HYDROCHLOROTHIAZIDE 25 MG PO TABS: 25.0000 mg | ORAL_TABLET | Freq: Every day | ORAL | 1 refills | Status: DC

## 2017-05-26 NOTE — Progress Notes (Signed)
Pre-visit discussion using our clinic review tool. No additional management support is needed unless otherwise documented below in the visit note.  

## 2017-05-26 NOTE — Assessment & Plan Note (Signed)
Slightly elevated tsh.  Discussed proper way to take the medication.  She will start taking it my itself in the am.  Recheck tsh in 6-8 weeks.

## 2017-05-26 NOTE — Progress Notes (Signed)
Patient ID: Mariah Moody, female   DOB: 10/18/1941, 76 y.o.   MRN: [id number]   Subjective:    Patient ID: Mariah Moody, female    DOB: Nov 01, 1941, 76 y.o.   MRN: [id number]  HPI  Patient here for a scheduled follow up.  She reports she is doing relatively well.  Tries to stay active.  No chest pain.  No sob.  No acid reflux.  No abdominal pain.  Bowels moving.  States her blood pressure is remaining elevated.  Blood pressures averaging 140-160s/60-70s.  She is also on diovan.  Concern regarding recall.  Will change medication.  She is taking her medications regularly.    Past Medical History:  Diagnosis Date  . Anemia   . Coronary atherosclerosis of native coronary vessel   . Diabetes mellitus without complication ([place])   . Fibroid   . GERD (gastroesophageal reflux disease)   . History of chicken pox   . Hyperlipidemia   . Hypertension   . Hypothyroidism    Past Surgical History:  Procedure Laterality Date  . COLONOSCOPY N/A 04/15/2015   Procedure: COLONOSCOPY;  Surgeon: Hulen Luster, MD;  Location: [facility] ENDOSCOPY;  Service: Gastroenterology;  Laterality: N/A;  . ESOPHAGOGASTRODUODENOSCOPY N/A 04/15/2015   Procedure: ESOPHAGOGASTRODUODENOSCOPY (EGD);  Surgeon: Hulen Luster, MD;  Location: [facility] ENDOSCOPY;  Service: Gastroenterology;  Laterality: N/A;  . TUBAL LIGATION     Family History  Problem Relation Age of Onset  . Arthritis Mother   . Hypertension Mother   . Colon polyps Mother   . Heart disease Father   . Hypertension Father   . Diabetes Brother   . Cancer Neg Hx    Social History   Social History  . Marital status: Married    Spouse name: N/A  . Number of children: N/A  . Years of education: N/A   Social History Main Topics  . Smoking status: Former Research scientist (life sciences)  . Smokeless tobacco: Never Used  . Alcohol use 0.0 oz/week     Comment: occas  . Drug use: No  . Sexual activity: Not Currently   Other Topics Concern  . None   Social History Narrative  .  None    Outpatient Encounter Prescriptions as of 05/26/2017  Medication Sig  . benzonatate (TESSALON PERLES) 100 MG capsule Take 1 capsule (100 mg total) by mouth 3 (three) times daily as needed for cough.  . Calcium Carbonate-Vitamin D (CALCIUM + D PO) Take 1 tablet by mouth daily.  . Cholecalciferol (VITAMIN D PO) Take 1 capsule by mouth daily.   [place] estradiol-norethindrone (COMBIPATCH) 0.05-0.25 MG/DAY Place 1 patch onto the skin 2 (two) times a week.  . levothyroxine (SYNTHROID, LEVOTHROID) 75 MCG tablet Take 1 tablet (75 mcg total) by mouth daily before breakfast.  . levothyroxine (SYNTHROID, LEVOTHROID) 75 MCG tablet TAKE 1 TABLET (75 MCG TOTAL) BY MOUTH DAILY BEFORE BREAKFAST.  . Multiple Vitamins-Minerals (MULTIVITAMIN PO) Take 1 tablet by mouth daily.   . pantoprazole (PROTONIX) 40 MG tablet TAKE 1 TABLET BY MOUTH DAILY  . valsartan (DIOVAN) 80 MG tablet Take 1 tablet (80 mg total) by mouth daily.  . [DISCONTINUED] hydrochlorothiazide (MICROZIDE) 12.5 MG capsule Take 1 capsule (12.5 mg total) by mouth 2 (two) times daily.  . hydrochlorothiazide (HYDRODIURIL) 25 MG tablet Take 1 tablet (25 mg total) by mouth daily.  . irbesartan (AVAPRO) 150 MG tablet Take 1 tablet (150 mg total) by mouth daily.  . [EXPIRED] Zoster Vac Recomb Adjuvanted (SHINGRIX) injection Inject  0.5 mLs into the muscle once.   No facility-administered encounter medications on file as of 05/26/2017.     Review of Systems  Constitutional: Negative for appetite change and unexpected weight change.  HENT: Negative for congestion and sinus pressure.   Respiratory: Negative for cough, chest tightness and shortness of breath.   Cardiovascular: Negative for chest pain, palpitations and leg swelling.  Gastrointestinal: Negative for abdominal pain, diarrhea, nausea and vomiting.  Genitourinary: Negative for difficulty urinating and dysuria.  Musculoskeletal: Negative for back pain and joint swelling.  Skin: Negative for  color change and rash.  Neurological: Negative for dizziness, light-headedness and headaches.  Psychiatric/Behavioral: Negative for agitation and dysphoric mood.       Objective:     Blood pressure rechecked by me:  295-284/13  Physical Exam  Constitutional: She appears well-developed and well-nourished. No distress.  HENT:  Nose: Nose normal.  Mouth/Throat: Oropharynx is clear and moist.  Neck: Neck supple. No thyromegaly present.  Cardiovascular: Normal rate and regular rhythm.   Pulmonary/Chest: Breath sounds normal. No respiratory distress. She has no wheezes.  Abdominal: Soft. Bowel sounds are normal. There is no tenderness.  Musculoskeletal: She exhibits no edema or tenderness.  Lymphadenopathy:    She has no cervical adenopathy.  Skin: No rash noted. No erythema.  Psychiatric: She has a normal mood and affect. Her behavior is normal.    BP 124/68 (BP Location: Left Arm, Patient Position: Sitting, Cuff Size: Normal)   Pulse 78   Temp 98.6 F (37 C) (Oral)   Resp 12   Wt 143 lb (64.9 kg)   BMI 26.16 kg/m  Wt Readings from Last 3 Encounters:  05/26/17 143 lb (64.9 kg)  02/22/17 140 lb 12.8 oz (63.9 kg)  01/11/17 140 lb 3.2 oz (63.6 kg)     Lab Results  Component Value Date   WBC 6.4 05/25/2017   HGB 13.0 05/25/2017   HCT 38.6 05/25/2017   PLT 263.0 05/25/2017   GLUCOSE 154 (H) 05/25/2017   CHOL 207 (H) 05/25/2017   TRIG 334.0 (H) 05/25/2017   HDL 33.70 (L) 05/25/2017   LDLDIRECT 102.0 05/25/2017   LDLCALC 135 (H) 02/25/2016   ALT 11 05/25/2017   AST 14 05/25/2017   NA 140 05/25/2017   K 3.8 05/25/2017   CL 105 05/25/2017   CREATININE 0.74 05/25/2017   BUN 17 05/25/2017   CO2 28 05/25/2017   TSH 4.74 (H) 05/25/2017   HGBA1C 6.7 (H) 05/25/2017   MICROALBUR 0.7 01/06/2017    Dg Bone Density  Result Date: 04/12/2017 EXAM: DUAL X-RAY ABSORPTIOMETRY (DXA) FOR BONE MINERAL DENSITY IMPRESSION: Dear Dr. Nicki Reaper, Your patient Montoya Watkin completed a BMD  test on 04/12/2017 using the [place] (analysis version: 14.10) manufactured by [employer]. The following summarizes the results of our evaluation. PATIENT BIOGRAPHICAL: Name: Sarahi, Borland Patient ID: [id number] Birth Date: 1941/03/22 Height: 62.0 in. Gender: Female Exam Date: 04/12/2017 Weight: 141.9 lbs. Indications: Advanced Age, Caucasian, History of Fracture (Adult), Postmenopausal Fractures: Right foot Treatments: CALCIUM VIT D, Estradiol, LEVOTHYROXINE, Multi-Vitamin with calcium, Vitamin D ASSESSMENT: The BMD measured at Femur Neck Right is 0.800 g/cm2 with a T-score of -1.7. This patient is considered osteopenic according to [place] [facility]) criteria. Site Region Measured Measured WHO Young Adult BMD Date       Age      Classification T-score AP Spine L1-L4 04/12/2017 [age] Normal 0.8 1.288 g/cm2 DualFemur Neck Right 04/12/2017 76.0 Osteopenia -1.7 0.800 g/cm2  World Pharmacologist (WHO) criteria for post-menopausal, Caucasian Women: Normal:       T-score at or above -1 SD Osteopenia:   T-score between -1 and -2.5 SD Osteoporosis: T-score at or below -2.5 SD RECOMMENDATIONS: [place] recommends that FDA-approved medical therapies be considered in postmenopausal women and men age 48 or older with a: 1. Hip or vertebral (clinical or morphometric) fracture. 2. T-score of < -2.5 at the spine or hip. 3. Ten-year fracture probability by FRAX of 3% or greater for hip fracture or 20% or greater for major osteoporotic fracture. All treatment decisions require clinical judgment and consideration of individual patient factors, including patient preferences, co-morbidities, previous drug use, risk factors not captured in the FRAX model (e.g. falls, vitamin D deficiency, increased bone turnover, interval significant decline in bone density) and possible under - or over-estimation of fracture risk by FRAX. All patients should ensure an adequate intake of  dietary calcium (1200 mg/d) and vitamin D (800 IU daily) unless contraindicated. FOLLOW-UP: People with diagnosed cases of osteoporosis or at high risk for fracture should have regular bone mineral density tests. For patients eligible for Medicare, routine testing is allowed once every 2 years. The testing frequency can be increased to one year for patients who have rapidly progressing disease, those who are receiving or discontinuing medical therapy to restore bone mass, or have additional risk factors. I have reviewed this report, and agree with the above findings. [facility] Dear Dr. Nicki Reaper, Your patient Lucila Maine completed a FRAX assessment on 04/12/2017 using the [place] (analysis version: 14.10) manufactured by [employer]. The following summarizes the results of our evaluation. PATIENT BIOGRAPHICAL: Name: Amiaya, Mcneeley Patient ID: [id number] Birth Date: [birth year] Height:    62.0 in. Gender:     Female    [age]       Weight:    141.9 lbs. Ethnicity:  White                            Exam Date: 04/12/2017 FRAX* RESULTS:  (version: 3.5) 10-year Probability of Fracture1 Major Osteoporotic Fracture2 Hip Fracture 18.1% 3.6% Population: [place] (Caucasian) Risk Factors: History of Fracture (Adult) Based on Femur (Right) Neck BMD 1 -The 10-year probability of fracture may be lower than reported if the patient has received treatment. 2 -Major Osteoporotic Fracture: Clinical Spine, Forearm, Hip or Shoulder *FRAX is a Materials engineer of the [employer] for Metabolic Bone Disease, a [place] (WHO) [facility]. ASSESSMENT: The probability of a major osteoporotic fracture is 18.1% within the next ten years. The probability of a hip fracture is 3.6 %within the next ten years. . Electronically Signed   By: Lowella Grip III M.D.   On: 04/12/2017 09:14       Assessment & Plan:   Problem List Items Addressed This  Visit    Diabetes mellitus type II, controlled, with no complications ([place])    Low carb diet and exercise.  Follow met b and a1c.       Relevant Medications   irbesartan (AVAPRO) 150 MG tablet   Essential hypertension, benign    Blood pressure remaining elevated as outlined.  On diovan.  Concern over recall.  Continue hctz.  She is taking 2 (12.47m) tablets.  Will change to 260mq day.  Will stop diovan.  Start avapro 15072m day.  Follow pressures.  Adjust medication as needed.  She will spot check her pressure and send in readings.  Get her back in soon to reassess.       Relevant Medications   hydrochlorothiazide (HYDRODIURIL) 25 MG tablet   irbesartan (AVAPRO) 150 MG tablet   GERD (gastroesophageal reflux disease)    Controlled on protonix.  Follow.       Hypercholesterolemia    Low cholesterol diet and exercise.  Follow lipid panel.        Relevant Medications   hydrochlorothiazide (HYDRODIURIL) 25 MG tablet   irbesartan (AVAPRO) 150 MG tablet   Hypothyroidism    Slightly elevated tsh.  Discussed proper way to take the medication.  She will start taking it my itself in the am.  Recheck tsh in 6-8 weeks.        Relevant Orders   TSH    Other Visit Diagnoses    Need for pneumococcal vaccination    -  Primary       Einar Pheasant, MD

## 2017-05-29 ENCOUNTER — Encounter: Payer: Self-pay | Admitting: Internal Medicine

## 2017-05-29 NOTE — Assessment & Plan Note (Signed)
Controlled on protonix.  Follow.   

## 2017-05-29 NOTE — Assessment & Plan Note (Signed)
Low cholesterol diet and exercise.  Follow lipid panel.   

## 2017-05-29 NOTE — Assessment & Plan Note (Signed)
Low carb diet and exercise.  Follow met b and a1c.  

## 2017-05-29 NOTE — Assessment & Plan Note (Signed)
Blood pressure remaining elevated as outlined.  On diovan.  Concern over recall.  Continue hctz.  She is taking 2 (12.5mg ) tablets.  Will change to 25mg  q day.  Will stop diovan.  Start avapro 150mg  q day.  Follow pressures.  Adjust medication as needed.  She will spot check her pressure and send in readings.  Get her back in soon to reassess.

## 2017-06-11 NOTE — Telephone Encounter (Signed)
06/14/17 AWV cancelled  06/11/17 Left pt message asking to call Ebony Hail back directly at [phone] to schedule AWV. Thanks!  *NOTE* Never had AWV before

## 2017-06-14 ENCOUNTER — Ambulatory Visit: Payer: PPO

## 2017-06-18 ENCOUNTER — Other Ambulatory Visit: Payer: Self-pay | Admitting: Internal Medicine

## 2017-06-25 ENCOUNTER — Telehealth: Payer: Self-pay | Admitting: Internal Medicine

## 2017-06-25 ENCOUNTER — Other Ambulatory Visit: Payer: Self-pay

## 2017-06-25 DIAGNOSIS — L03011 Cellulitis of right finger: Secondary | ICD-10-CM | POA: Diagnosis not present

## 2017-06-25 NOTE — Telephone Encounter (Signed)
FYI

## 2017-06-25 NOTE — Telephone Encounter (Signed)
Left message to return call to our office. We are not able to send script to any pharmacy outside of [place]. We can get hard copy and patient can mail or fax herself but we are not able to do so.

## 2017-06-25 NOTE — Telephone Encounter (Signed)
Pt also wanted to let Dr Nicki Reaper know that the  bp medication is working. Thank you!

## 2017-06-25 NOTE — Telephone Encounter (Signed)
Pt called about needing a refill for estradiol-norethindrone (COMBIPATCH) 0.05-0.25 MG/DAY.   Pharmacy is [facility] in [place] [phone] Fax number [phone]. Thank you!

## 2017-06-25 NOTE — Telephone Encounter (Signed)
Called patient informed that we can not give abx with out evaluation. She will go to another walking and get evaluation for that today.

## 2017-06-25 NOTE — Telephone Encounter (Signed)
Pt called and stated that [facility] and she has a infection of her cuticle. They called her insurance company and they will not cover. She advise to call pcp to have [place] call in an antibiotic. Please advise, thank you!  Call pt @ [phone]

## 2017-06-25 NOTE — Telephone Encounter (Signed)
Please advise with other message

## 2017-06-25 NOTE — Telephone Encounter (Signed)
Called patient gave information. States that script was sent for her in the past. Informed that I was given information from my direct supervisor. That I would send message and get further directions from you. Informed it would be next week before I would have response.

## 2017-06-28 NOTE — Telephone Encounter (Signed)
Have called and l/m to call office also when she calls we need to let her know that provider has been informed about blood pressure meds working. She would like to have her continue to check and record those.

## 2017-06-28 NOTE — Telephone Encounter (Signed)
This is correct.  We can provide her with a prescription that she can get refilled at pharmacy of her choice.

## 2017-06-28 NOTE — Telephone Encounter (Signed)
Great.  Continue to monitor blood pressures and let us know if any problems.

## 2017-06-28 NOTE — Telephone Encounter (Signed)
See other message for further documentation.

## 2017-06-30 NOTE — Telephone Encounter (Signed)
Left message to return call to our office.  

## 2017-07-01 MED ORDER — ESTRADIOL-NORETHINDRONE ACET 0.05-0.25 MG/DAY TD PTTW: 1.0000 | MEDICATED_PATCH | TRANSDERMAL | 1 refills | Status: DC

## 2017-07-01 NOTE — Telephone Encounter (Signed)
rx ok'd and signed and placed in box for combipatch

## 2017-07-01 NOTE — Telephone Encounter (Signed)
Patient informed would like to have script printed she will come by tomorrow to pick up.

## 2017-07-01 NOTE — Telephone Encounter (Signed)
Please call pt today at [phone]

## 2017-07-01 NOTE — Telephone Encounter (Signed)
Taken to reception.

## 2017-07-02 NOTE — Telephone Encounter (Signed)
Left pt message asking to call Allison back directly at [phone] to schedule AWV. Thanks! °

## 2017-08-02 ENCOUNTER — Other Ambulatory Visit (INDEPENDENT_AMBULATORY_CARE_PROVIDER_SITE_OTHER): Payer: PPO

## 2017-08-02 DIAGNOSIS — E039 Hypothyroidism, unspecified: Secondary | ICD-10-CM | POA: Diagnosis not present

## 2017-08-02 LAB — TSH: TSH: 4.98 u[IU]/mL — AB (ref 0.35–4.50)

## 2017-08-03 ENCOUNTER — Other Ambulatory Visit: Payer: Self-pay

## 2017-08-03 MED ORDER — LEVOTHYROXINE SODIUM 88 MCG PO TABS: 88.0000 ug | ORAL_TABLET | Freq: Every day | ORAL | 0 refills | Status: DC

## 2017-08-16 ENCOUNTER — Encounter: Payer: Self-pay | Admitting: Internal Medicine

## 2017-08-16 ENCOUNTER — Ambulatory Visit (INDEPENDENT_AMBULATORY_CARE_PROVIDER_SITE_OTHER): Payer: PPO | Admitting: Internal Medicine

## 2017-08-16 VITALS — BP 126/70 | HR 70 | Temp 98.6°F | Resp 14 | Ht 62.0 in | Wt 141.8 lb

## 2017-08-16 DIAGNOSIS — Z1239 Encounter for other screening for malignant neoplasm of breast: Secondary | ICD-10-CM

## 2017-08-16 DIAGNOSIS — E119 Type 2 diabetes mellitus without complications: Secondary | ICD-10-CM

## 2017-08-16 DIAGNOSIS — K219 Gastro-esophageal reflux disease without esophagitis: Secondary | ICD-10-CM

## 2017-08-16 DIAGNOSIS — Z1231 Encounter for screening mammogram for malignant neoplasm of breast: Secondary | ICD-10-CM

## 2017-08-16 DIAGNOSIS — E039 Hypothyroidism, unspecified: Secondary | ICD-10-CM

## 2017-08-16 DIAGNOSIS — E7800 Pure hypercholesterolemia, unspecified: Secondary | ICD-10-CM

## 2017-08-16 DIAGNOSIS — I10 Essential (primary) hypertension: Secondary | ICD-10-CM | POA: Diagnosis not present

## 2017-08-16 MED ORDER — OLMESARTAN MEDOXOMIL 20 MG PO TABS: 20.0000 mg | ORAL_TABLET | Freq: Every day | ORAL | 0 refills | Status: DC

## 2017-08-16 NOTE — Progress Notes (Signed)
Patient ID: Ulonda Klosowski, female   DOB: 21-Mar-1941, 76 y.o.   MRN: [id number]   Subjective:    Patient ID: [name], female    DOB: 02/11/1941, 76 y.o.   MRN: [id number]  HPI  Patient here for a scheduled follow up.  She reports she is doing relatively well.  Recently had to change her blood pressure medication secondary to recall of diovan.  She was initially placed on lisinopril and then losartan.  Had intolerance to these medications.  Was changed to avapro.  Noticed itching of her hands and bottom of her feet.  No rash.  Is some better.  No lip or tongue swelling.  States otherwise doing well.  No chest pain.  No sob.  No acid reflux.  No abdominal pain.  Bowels moving.  Just recently changed synthroid dose.     Past Medical History:  Diagnosis Date  . Anemia   . Coronary atherosclerosis of native coronary vessel   . Diabetes mellitus without complication ([place])   . Fibroid   . GERD (gastroesophageal reflux disease)   . History of chicken pox   . Hyperlipidemia   . Hypertension   . Hypothyroidism    Past Surgical History:  Procedure Laterality Date  . COLONOSCOPY N/A 04/15/2015   Procedure: COLONOSCOPY;  Surgeon: Hulen Luster, MD;  Location[facility] ENDOSCOPY;  Service: Gastroenterology;  Laterality: N/A;  . ESOPHAGOGASTRODUODENOSCOPY N/A 04/15/2015   Procedure: ESOPHAGOGASTRODUODENOSCOPY (EGD);  Surgeon: Hulen Luster, MD;  Location: [facility] ENDOSCOPY;  Service: Gastroenterology;  Laterality: N/A;  . TUBAL LIGATION     Family History  Problem Relation Age of Onset  . Arthritis Mother   . Hypertension Mother   . Colon polyps Mother   . Heart disease Father   . Hypertension Father   . Diabetes Brother   . Cancer Neg Hx    Social History   Social History  . Marital status: Married    Spouse name: N/A  . Number of children: N/A  . Years of education: N/A   Social History Main Topics  . Smoking status: Former Research scientist (life sciences)  . Smokeless tobacco: Never Used  . Alcohol use  0.0 oz/week     Comment: occas  . Drug use: No  . Sexual activity: Not Currently   Other Topics Concern  . None   Social History Narrative  . None    Outpatient Encounter Prescriptions as of 08/16/2017  Medication Sig  . benzonatate (TESSALON PERLES) 100 MG capsule Take 1 capsule (100 mg total) by mouth 3 (three) times daily as needed for cough.  . Calcium Carbonate-Vitamin D (CALCIUM + D PO) Take 1 tablet by mouth daily.  . Cholecalciferol (VITAMIN D PO) Take 1 capsule by mouth daily.   Marland Kitchen estradiol-norethindrone (COMBIPATCH) 0.05-0.25 MG/DAY Place 1 patch onto the skin 2 (two) times a week.  . hydrochlorothiazide (HYDRODIURIL) 25 MG tablet Take 1 tablet (25 mg total) by mouth daily.  Marland Kitchen levothyroxine (SYNTHROID, LEVOTHROID) 88 MCG tablet Take 1 tablet (88 mcg total) by mouth daily.  . Multiple Vitamins-Minerals (MULTIVITAMIN PO) Take 1 tablet by mouth daily.   . pantoprazole (PROTONIX) 40 MG tablet TAKE 1 TABLET BY MOUTH DAILY  . [DISCONTINUED] hydrochlorothiazide (MICROZIDE) 12.5 MG capsule TAKE 1 CAPSULE (12.5 MG TOTAL) BY MOUTH 2 (TWO) TIMES DAILY.  . [DISCONTINUED] irbesartan (AVAPRO) 150 MG tablet Take 1 tablet (150 mg total) by mouth daily.  . [DISCONTINUED] valsartan (DIOVAN) 80 MG tablet Take 1 tablet (80 mg total)  by mouth daily.  Marland Kitchen olmesartan (BENICAR) 20 MG tablet Take 1 tablet (20 mg total) by mouth daily.   No facility-administered encounter medications on file as of 08/16/2017.     Review of Systems  Constitutional: Negative for appetite change and unexpected weight change.  HENT: Negative for congestion and sinus pressure.   Respiratory: Negative for cough, chest tightness and shortness of breath.   Cardiovascular: Negative for chest pain, palpitations and leg swelling.  Gastrointestinal: Negative for abdominal pain, diarrhea, nausea and vomiting.  Genitourinary: Negative for difficulty urinating and dysuria.  Musculoskeletal: Negative for back pain and joint  swelling.  Skin: Negative for rash.       Itching - hands and feet.    Neurological: Negative for dizziness, light-headedness and headaches.  Psychiatric/Behavioral: Negative for agitation and dysphoric mood.       Objective:    Physical Exam  Constitutional: She appears well-developed and well-nourished. No distress.  HENT:  Nose: Nose normal.  Mouth/Throat: Oropharynx is clear and moist.  Neck: Neck supple. No thyromegaly present.  Cardiovascular: Normal rate and regular rhythm.   Pulmonary/Chest: Breath sounds normal. No respiratory distress. She has no wheezes.  Abdominal: Soft. Bowel sounds are normal. There is no tenderness.  Musculoskeletal: She exhibits no edema or tenderness.  Lymphadenopathy:    She has no cervical adenopathy.  Skin: No rash noted. No erythema.  Psychiatric: She has a normal mood and affect. Her behavior is normal.    BP 126/70 (BP Location: Left Arm, Patient Position: Sitting, Cuff Size: Normal)   Pulse 70   Temp 98.6 F (37 C) (Oral)   Resp 14   Ht _0  (1.575 m)   Wt 141 lb 12.8 oz (64.3 kg)   SpO2 97%   BMI 25.94 kg/m  Wt Readings from Last 3 Encounters:  08/16/17 141 lb 12.8 oz (64.3 kg)  05/26/17 143 lb (64.9 kg)  02/22/17 140 lb 12.8 oz (63.9 kg)     Lab Results  Component Value Date   WBC 6.4 05/25/2017   HGB 13.0 05/25/2017   HCT 38.6 05/25/2017   PLT 263.0 05/25/2017   GLUCOSE 154 (H) 05/25/2017   CHOL 207 (H) 05/25/2017   TRIG 334.0 (H) 05/25/2017   HDL 33.70 (L) 05/25/2017   LDLDIRECT 102.0 05/25/2017   LDLCALC 135 (H) 02/25/2016   ALT 11 05/25/2017   AST 14 05/25/2017   NA 140 05/25/2017   K 3.8 05/25/2017   CL 105 05/25/2017   CREATININE 0.74 05/25/2017   BUN 17 05/25/2017   CO2 28 05/25/2017   TSH 4.98 (H) 08/02/2017   HGBA1C 6.7 (H) 05/25/2017   MICROALBUR 0.7 01/06/2017    Dg Bone Density  Result Date: 04/12/2017 EXAM: DUAL X-RAY ABSORPTIOMETRY (DXA) FOR BONE MINERAL DENSITY IMPRESSION: Dear Dr. Nicki Reaper,  Your patient Genise Strack completed a BMD test on 04/12/2017 using the [place] (analysis version: 14.10) manufactured by [employer]. The following summarizes the results of our evaluation. PATIENT BIOGRAPHICAL: Name: Samhitha, Rosen Patient ID: [id number] Birth Date: 1941/05/02 Height: 62.0 in. Gender: Female Exam Date: 04/12/2017 Weight: 141.9 lbs. Indications: Advanced Age, Caucasian, History of Fracture (Adult), Postmenopausal Fractures: Right foot Treatments: CALCIUM VIT D, Estradiol, LEVOTHYROXINE, Multi-Vitamin with calcium, Vitamin D ASSESSMENT: The BMD measured at Femur Neck Right is 0.800 g/cm2 with a T-score of -1.7. This patient is considered osteopenic according to [facility]) criteria. Site Region Measured Measured WHO Young Adult BMD Date  Age      Classification T-score AP Spine L1-L4 04/12/2017 74.0 Normal 0.8 1.288 g/cm2 DualFemur Neck Right 04/12/2017 74.0 Osteopenia -1.7 0.800 g/cm2 World Health Organization [facility]) criteria for post-menopausal, Caucasian Women: Normal:       T-score at or above -1 SD Osteopenia:   T-score between -1 and -2.5 SD Osteoporosis: T-score at or below -2.5 SD RECOMMENDATIONS: [place] recommends that FDA-approved medical therapies be considered in postmenopausal women and men age 79 or older with a: 1. Hip or vertebral (clinical or morphometric) fracture. 2. T-score of < -2.5 at the spine or hip. 3. Ten-year fracture probability by FRAX of 3% or greater for hip fracture or 20% or greater for major osteoporotic fracture. All treatment decisions require clinical judgment and consideration of individual patient factors, including patient preferences, co-morbidities, previous drug use, risk factors not captured in the FRAX model (e.g. falls, vitamin D deficiency, increased bone turnover, interval significant decline in bone density) and possible under - or over-estimation of fracture risk by FRAX. All  patients should ensure an adequate intake of dietary calcium (1200 mg/d) and vitamin D (800 IU daily) unless contraindicated. FOLLOW-UP: People with diagnosed cases of osteoporosis or at high risk for fracture should have regular bone mineral density tests. For patients eligible for Medicare, routine testing is allowed once every 2 years. The testing frequency can be increased to one year for patients who have rapidly progressing disease, those who are receiving or discontinuing medical therapy to restore bone mass, or have additional risk factors. I have reviewed this report, and agree with the above findings. [facility] Radiology Dear Dr. Nicki Reaper, Your patient Lucila Maine completed a FRAX assessment on 04/12/2017 using the [place] (analysis version: 14.10) manufactured by [employer]. The following summarizes the results of our evaluation. PATIENT BIOGRAPHICAL: Name: Cantrell, Martus Patient ID: [id number] Birth Date: 1941/10/27 Height:    62.0 in. Gender:     Female    [age]       Weight:    141.9 lbs. Ethnicity:  White                            Exam Date: 04/12/2017 FRAX* RESULTS:  (version: 3.5) 10-year Probability of Fracture1 Major Osteoporotic Fracture2 Hip Fracture 18.1% 3.6% Population: [place] (Caucasian) Risk Factors: History of Fracture (Adult) Based on Femur (Right) Neck BMD 1 -The 10-year probability of fracture may be lower than reported if the patient has received treatment. 2 -Major Osteoporotic Fracture: Clinical Spine, Forearm, Hip or Shoulder *FRAX is a Materials engineer of the [employer] for Metabolic Bone Disease, a [place] (WHO) [facility]. ASSESSMENT: The probability of a major osteoporotic fracture is 18.1% within the next ten years. The probability of a hip fracture is 3.6 %within the next ten years. . Electronically Signed   By: Lowella Grip III M.D.   On: 04/12/2017 09:14       Assessment &  Plan:   Problem List Items Addressed This Visit    Diabetes mellitus type II, controlled, with no complications ([place])    Low carb diet and exercise.  Follow met b and a1c.       Relevant Medications   olmesartan (BENICAR) 20 MG tablet   Other Relevant Orders   Hemoglobin A1c   Essential hypertension, benign    She is not tolerating avapro.  Feels is causing itching of her hands and feet.  No rash.  No lip or tongue swelling.  Is better.  No problems with diovan.  Has had problems with amlodipine, lisinopril and losartan.  Had the recall on diovan.  Will start benicar and follow pressures.  Get her back in soon to reassess.        Relevant Medications   olmesartan (BENICAR) 20 MG tablet   Other Relevant Orders   Basic metabolic panel   GERD (gastroesophageal reflux disease)    Controlled on omeprazole.        Hypercholesterolemia    Low cholesterol diet and exercise.  Follow lipid panel.        Relevant Medications   olmesartan (BENICAR) 20 MG tablet   Other Relevant Orders   Lipid panel   Hepatic function panel   Hypothyroidism    On thyroid replacement.  Just adjusted dose.  Now on 58mg q day.  Follow tsh.       Relevant Orders   TSH    Other Visit Diagnoses    Screening for breast cancer    -  Primary   Relevant Orders   MM DIGITAL SCREENING BILATERAL       SEinar Pheasant MD

## 2017-08-18 ENCOUNTER — Encounter: Payer: Self-pay | Admitting: Internal Medicine

## 2017-08-18 NOTE — Assessment & Plan Note (Signed)
Low carb diet and exercise.  Follow met b and a1c.  

## 2017-08-18 NOTE — Assessment & Plan Note (Signed)
Controlled on omeprazole.   

## 2017-08-18 NOTE — Assessment & Plan Note (Addendum)
On thyroid replacement.  Just adjusted dose.  Now on 52mcg q day.  Follow tsh.

## 2017-08-18 NOTE — Assessment & Plan Note (Addendum)
She is not tolerating avapro.  Feels is causing itching of her hands and feet.  No rash.  No lip or tongue swelling.  Is better.  No problems with diovan.  Has had problems with amlodipine, lisinopril and losartan.  Had the recall on diovan.  Will start benicar and follow pressures.  Get her back in soon to reassess.

## 2017-08-18 NOTE — Assessment & Plan Note (Signed)
Low cholesterol diet and exercise.  Follow lipid panel.   

## 2017-08-22 ENCOUNTER — Other Ambulatory Visit: Payer: Self-pay | Admitting: Internal Medicine

## 2017-09-20 ENCOUNTER — Other Ambulatory Visit (INDEPENDENT_AMBULATORY_CARE_PROVIDER_SITE_OTHER): Payer: PPO

## 2017-09-20 DIAGNOSIS — I10 Essential (primary) hypertension: Secondary | ICD-10-CM

## 2017-09-20 DIAGNOSIS — E7800 Pure hypercholesterolemia, unspecified: Secondary | ICD-10-CM | POA: Diagnosis not present

## 2017-09-20 DIAGNOSIS — E119 Type 2 diabetes mellitus without complications: Secondary | ICD-10-CM | POA: Diagnosis not present

## 2017-09-20 DIAGNOSIS — E039 Hypothyroidism, unspecified: Secondary | ICD-10-CM | POA: Diagnosis not present

## 2017-09-20 LAB — BASIC METABOLIC PANEL
BUN: 21 mg/dL (ref 6–23)
CHLORIDE: 106 meq/L (ref 96–112)
CO2: 29 mEq/L (ref 19–32)
CREATININE: 0.73 mg/dL (ref 0.40–1.20)
Calcium: 9.4 mg/dL (ref 8.4–10.5)
GFR: 82.71 mL/min (ref >60.00)
Glucose, Bld: 159 mg/dL — ABNORMAL HIGH (ref 70–99)
Potassium: 4.1 mEq/L (ref 3.5–5.1)
Sodium: 141 mEq/L (ref 135–145)

## 2017-09-20 LAB — LIPID PANEL
Cholesterol: 204 mg/dL — ABNORMAL HIGH (ref 0–200)
HDL: 31.2 mg/dL — ABNORMAL LOW (ref >39.00)
NONHDL: 173.06
Total CHOL/HDL Ratio: 7
Triglycerides: 350 mg/dL — ABNORMAL HIGH (ref 0.0–149.0)
VLDL: 70 mg/dL — ABNORMAL HIGH (ref 0.0–40.0)

## 2017-09-20 LAB — HEPATIC FUNCTION PANEL
ALT: 11 U/L (ref 0–35)
AST: 14 U/L (ref 0–37)
Albumin: 3.9 g/dL (ref 3.5–5.2)
Alkaline Phosphatase: 77 U/L (ref 39–117)
BILIRUBIN TOTAL: 1.1 mg/dL (ref 0.2–1.2)
Bilirubin, Direct: 0.1 mg/dL (ref 0.0–0.3)
Total Protein: 6.5 g/dL (ref 6.0–8.3)

## 2017-09-20 LAB — TSH: TSH: 2.55 u[IU]/mL (ref 0.35–4.50)

## 2017-09-20 LAB — LDL CHOLESTEROL, DIRECT: Direct LDL: 92 mg/dL

## 2017-09-20 LAB — HEMOGLOBIN A1C: Hgb A1c MFr Bld: 6.9 % — ABNORMAL HIGH (ref 4.6–6.5)

## 2017-09-21 ENCOUNTER — Other Ambulatory Visit: Payer: Self-pay

## 2017-09-21 MED ORDER — LEVOTHYROXINE SODIUM 88 MCG PO TABS: 88.0000 ug | ORAL_TABLET | Freq: Every day | ORAL | 0 refills | Status: DC

## 2017-09-26 ENCOUNTER — Encounter: Payer: Self-pay | Admitting: Internal Medicine

## 2017-10-04 ENCOUNTER — Ambulatory Visit: Payer: PPO | Admitting: Internal Medicine

## 2017-10-04 ENCOUNTER — Encounter: Payer: Self-pay | Admitting: Internal Medicine

## 2017-10-04 DIAGNOSIS — I10 Essential (primary) hypertension: Secondary | ICD-10-CM

## 2017-10-04 DIAGNOSIS — E119 Type 2 diabetes mellitus without complications: Secondary | ICD-10-CM

## 2017-10-04 DIAGNOSIS — E7800 Pure hypercholesterolemia, unspecified: Secondary | ICD-10-CM | POA: Diagnosis not present

## 2017-10-04 MED ORDER — OLMESARTAN MEDOXOMIL 40 MG PO TABS: 40.0000 mg | ORAL_TABLET | Freq: Every day | ORAL | 1 refills | Status: DC

## 2017-10-04 NOTE — Progress Notes (Signed)
Patient ID: Mariah Moody, female   DOB: 28-Aug-1941, 76 y.o.   MRN: [id number]   Subjective:    Patient ID: Mariah Moody, female    DOB: Mar 15, 1941, 76 y.o.   MRN: [id number]  HPI  Patient here for a scheduled follow up.  She reports she is doing well.  Some fatigue.  Working at least 50 hours per week.  No chest pain.  No sob.  No acid reflux.  No abdominal pain.  Bowels moving.  Handling stress.  Has had problems with various blood pressure medications.  See previous note.  Was placed on benicar.  States she is having some leg aching at times.  May be just in one area.  She is on her feet most of the day.  Discussed may be more msk in origin.  Minimal.  She also reports minimal itching of her hand.  No rash.  No other itching.  Blood pressure still remains a little elevated.  Reviewed her outside readings.     Past Medical History:  Diagnosis Date  . Anemia   . Coronary atherosclerosis of native coronary vessel   . Diabetes mellitus without complication ([place])   . Fibroid   . GERD (gastroesophageal reflux disease)   . History of chicken pox   . Hyperlipidemia   . Hypertension   . Hypothyroidism    Past Surgical History:  Procedure Laterality Date  . COLONOSCOPY N/A 04/15/2015   Procedure: COLONOSCOPY;  Surgeon: Hulen Luster, MD;  Location[facility] ENDOSCOPY;  Service: Gastroenterology;  Laterality: N/A;  . ESOPHAGOGASTRODUODENOSCOPY N/A 04/15/2015   Procedure: ESOPHAGOGASTRODUODENOSCOPY (EGD);  Surgeon: Hulen Luster, MD;  Location: [facility] ENDOSCOPY;  Service: Gastroenterology;  Laterality: N/A;  . TUBAL LIGATION     Family History  Problem Relation Age of Onset  . Arthritis Mother   . Hypertension Mother   . Colon polyps Mother   . Heart disease Father   . Hypertension Father   . Diabetes Brother   . Cancer Neg Hx    Social History   Socioeconomic History  . Marital status: Married    Spouse name: None  . Number of children: None  . Years of education: None  . Highest  education level: None  Social Needs  . Financial resource strain: None  . Food insecurity - worry: None  . Food insecurity - inability: None  . Transportation needs - medical: None  . Transportation needs - non-medical: None  Occupational History  . None  Tobacco Use  . Smoking status: Former Research scientist (life sciences)  . Smokeless tobacco: Never Used  Substance and Sexual Activity  . Alcohol use: Yes    Alcohol/week: 0.0 oz    Comment: occas  . Drug use: No  . Sexual activity: Not Currently  Other Topics Concern  . None  Social History Narrative  . None    Outpatient Encounter Medications as of 10/04/2017  Medication Sig  . benzonatate (TESSALON PERLES) 100 MG capsule Take 1 capsule (100 mg total) by mouth 3 (three) times daily as needed for cough.  . Calcium Carbonate-Vitamin D (CALCIUM + D PO) Take 1 tablet by mouth daily.  . Cholecalciferol (VITAMIN D PO) Take 1 capsule by mouth daily.   Marland Kitchen estradiol-norethindrone (COMBIPATCH) 0.05-0.25 MG/DAY Place 1 patch onto the skin 2 (two) times a week. (Patient taking differently: Place 1 patch onto the skin daily. )  . hydrochlorothiazide (HYDRODIURIL) 25 MG tablet Take 1 tablet (25 mg total) by mouth daily.  Marland Kitchen levothyroxine (  SYNTHROID, LEVOTHROID) 88 MCG tablet Take 1 tablet (88 mcg total) daily by mouth.  . Multiple Vitamins-Minerals (MULTIVITAMIN PO) Take 1 tablet by mouth daily.   . pantoprazole (PROTONIX) 40 MG tablet TAKE 1 TABLET BY MOUTH DAILY  . [DISCONTINUED] olmesartan (BENICAR) 20 MG tablet Take 1 tablet (20 mg total) by mouth daily.  Marland Kitchen olmesartan (BENICAR) 40 MG tablet Take 1 tablet (40 mg total) by mouth daily.   No facility-administered encounter medications on file as of 10/04/2017.     Review of Systems  Constitutional: Negative for appetite change and unexpected weight change.  HENT: Negative for congestion and sinus pressure.   Respiratory: Negative for cough, chest tightness and shortness of breath.   Cardiovascular: Negative for  chest pain, palpitations and leg swelling.  Gastrointestinal: Negative for abdominal pain, diarrhea, nausea and vomiting.  Genitourinary: Negative for difficulty urinating and dysuria.  Musculoskeletal: Negative for joint swelling.       Some leg aching as outlined.  Intermittent.   Skin: Negative for color change and rash.       Minimal hand itching.   Neurological: Negative for dizziness, light-headedness and headaches.  Psychiatric/Behavioral: Negative for agitation and dysphoric mood.       Objective:     Blood pressure rechecked by me:  150/78  Physical Exam  Constitutional: She appears well-developed and well-nourished. No distress.  HENT:  Nose: Nose normal.  Mouth/Throat: Oropharynx is clear and moist.  Neck: Neck supple. No thyromegaly present.  Cardiovascular: Normal rate and regular rhythm.  Pulmonary/Chest: Breath sounds normal. No respiratory distress. She has no wheezes.  Abdominal: Soft. Bowel sounds are normal. There is no tenderness.  Musculoskeletal: She exhibits no edema or tenderness.  Lymphadenopathy:    She has no cervical adenopathy.  Skin: No rash noted. No erythema.  Psychiatric: She has a normal mood and affect. Her behavior is normal.    BP 130/82   Pulse 68   Temp 98 F (36.7 C)   Resp 14   Ht 5' 2" (1.575 m)   Wt 141 lb 8 oz (64.2 kg)   SpO2 96%   BMI 25.88 kg/m  Wt Readings from Last 3 Encounters:  10/04/17 141 lb 8 oz (64.2 kg)  08/16/17 141 lb 12.8 oz (64.3 kg)  05/26/17 143 lb (64.9 kg)     Lab Results  Component Value Date   WBC 6.4 05/25/2017   HGB 13.0 05/25/2017   HCT 38.6 05/25/2017   PLT 263.0 05/25/2017   GLUCOSE 159 (H) 09/20/2017   CHOL 204 (H) 09/20/2017   TRIG 350.0 (H) 09/20/2017   HDL 31.20 (L) 09/20/2017   LDLDIRECT 92.0 09/20/2017   LDLCALC 135 (H) 02/25/2016   ALT 11 09/20/2017   AST 14 09/20/2017   NA 141 09/20/2017   K 4.1 09/20/2017   CL 106 09/20/2017   CREATININE 0.73 09/20/2017   BUN 21  09/20/2017   CO2 29 09/20/2017   TSH 2.55 09/20/2017   HGBA1C 6.9 (H) 09/20/2017   MICROALBUR 0.7 01/06/2017       Assessment & Plan:   Problem List Items Addressed This Visit    Diabetes mellitus type II, controlled, with no complications ([place])    Low carb diet and exercise.  Follow met b and a1c.       Relevant Medications   olmesartan (BENICAR) 40 MG tablet   Essential hypertension, benign    Did not tolerate avapro.  Has had problems with amlodipine, lisinopril and losartan.  Tolerating  benicar as outlined.  Will try increasing the dose.  Increase to 43m q day.  Follow pressures.  Follow metabolic panel.        Relevant Medications   olmesartan (BENICAR) 40 MG tablet   Hypercholesterolemia    Low cholesterol diet and exercise.  Follow lipid panel.        Relevant Medications   olmesartan (BENICAR) 40 MG tablet       SEinar Pheasant MD

## 2017-10-05 ENCOUNTER — Encounter: Payer: Self-pay | Admitting: Internal Medicine

## 2017-10-05 NOTE — Assessment & Plan Note (Signed)
Low cholesterol diet and exercise.  Follow lipid panel.   

## 2017-10-05 NOTE — Assessment & Plan Note (Signed)
Low carb diet and exercise.  Follow met b and a1c.  

## 2017-10-05 NOTE — Assessment & Plan Note (Signed)
Did not tolerate avapro.  Has had problems with amlodipine, lisinopril and losartan.  Tolerating benicar as outlined.  Will try increasing the dose.  Increase to 40mg  q day.  Follow pressures.  Follow metabolic panel.

## 2017-10-29 ENCOUNTER — Other Ambulatory Visit: Payer: Self-pay | Admitting: Internal Medicine

## 2017-11-08 ENCOUNTER — Ambulatory Visit
Admission: RE | Admit: 2017-11-08 | Discharge: 2017-11-08 | Disposition: A | Discharge status: Home / Self Care | Payer: PPO | Source: Ambulatory Visit | Attending: Internal Medicine | Admitting: Internal Medicine

## 2017-11-08 DIAGNOSIS — Z1231 Encounter for screening mammogram for malignant neoplasm of breast: Secondary | ICD-10-CM | POA: Insufficient documentation

## 2017-11-08 DIAGNOSIS — Z1239 Encounter for other screening for malignant neoplasm of breast: Secondary | ICD-10-CM

## 2017-11-08 IMAGING — MG MM DIGITAL SCREENING BILAT W/ TOMO W/ CAD
8 of 13 series · 8 of 29 positions shown · non-contrast
Comparison: Previous exam(s).

CLINICAL DATA: Screening.

EXAM:
2D DIGITAL SCREENING BILATERAL MAMMOGRAM WITH 3D TOMO WITH CAD

[L MLO (1 of 2)]
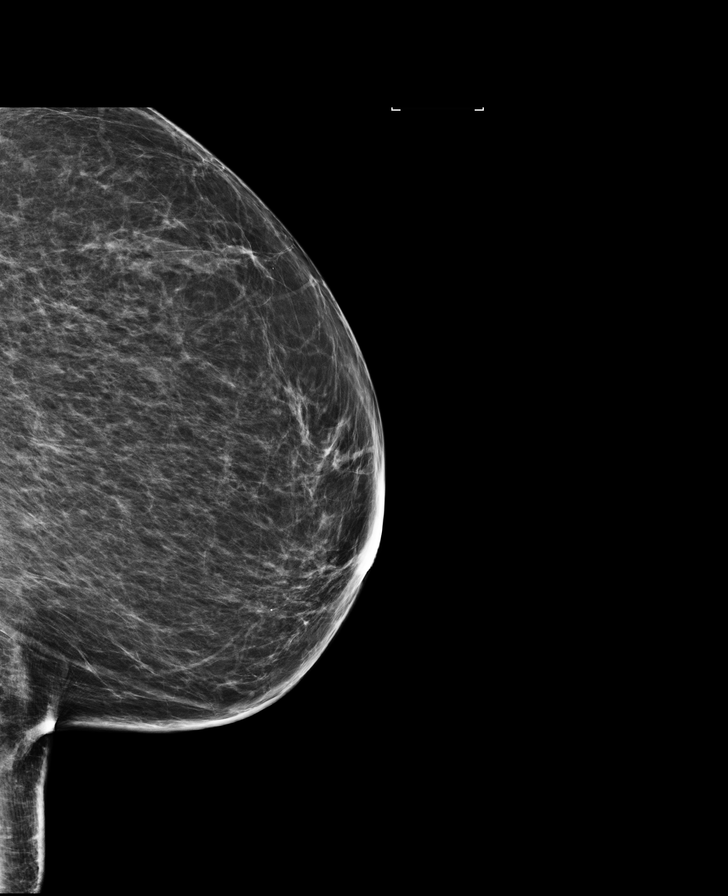

[R MLO]
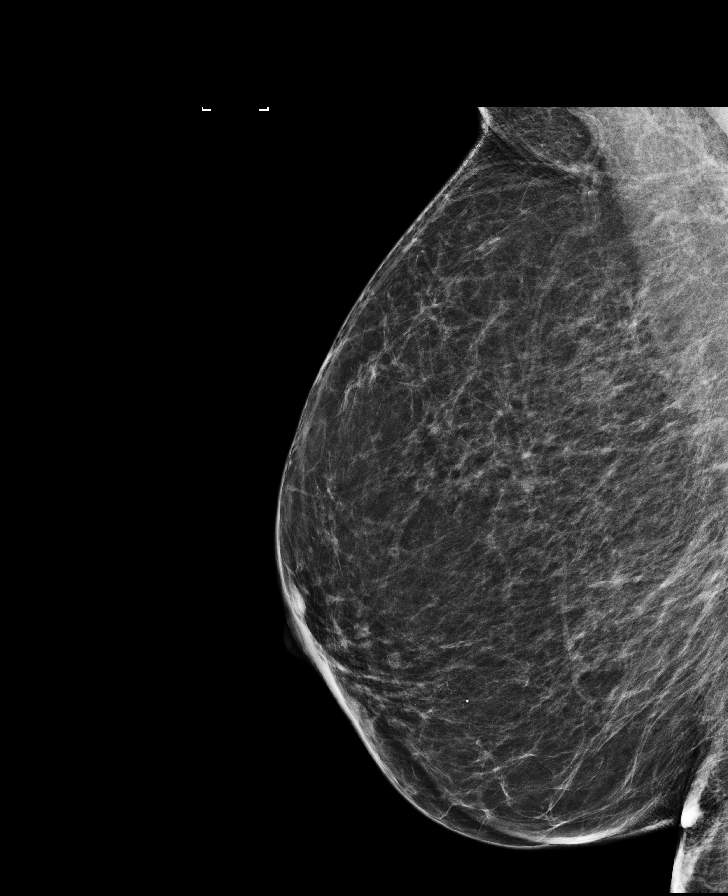

[R CC synth-2D]
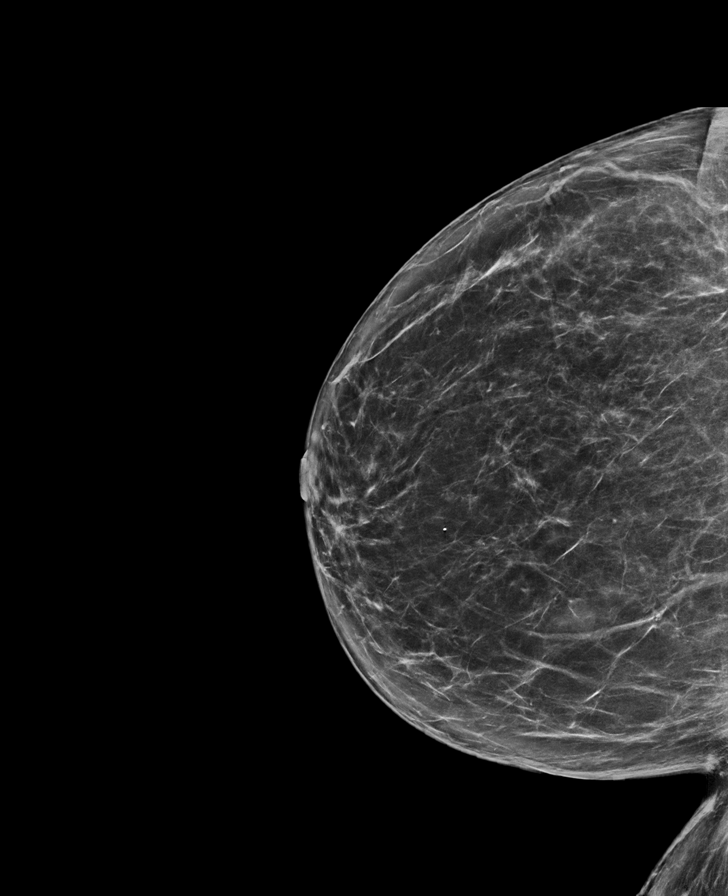

[R MLO synth-2D]
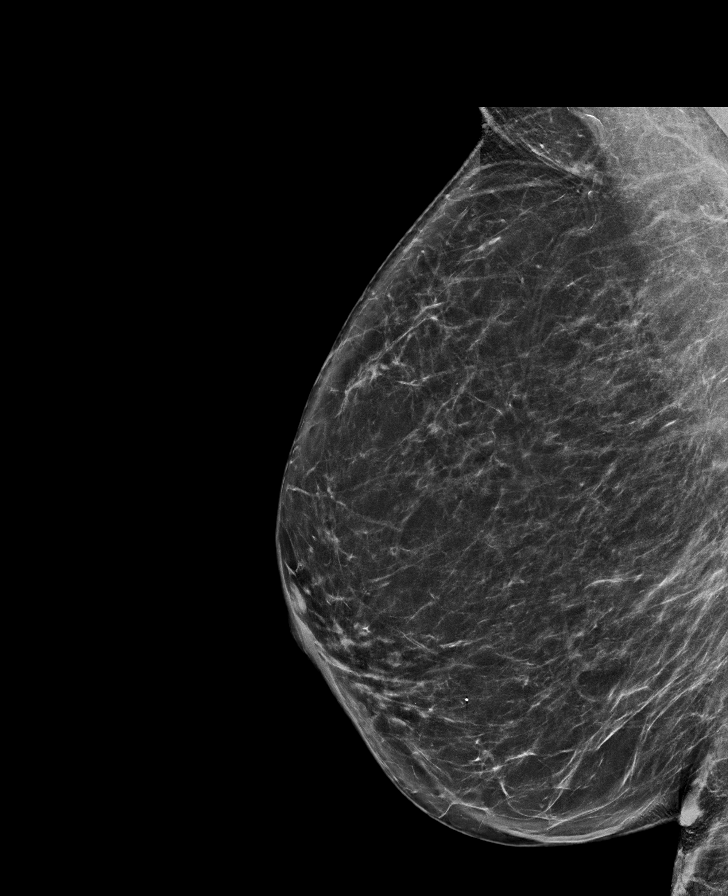

[L MLO (2 of 2)]
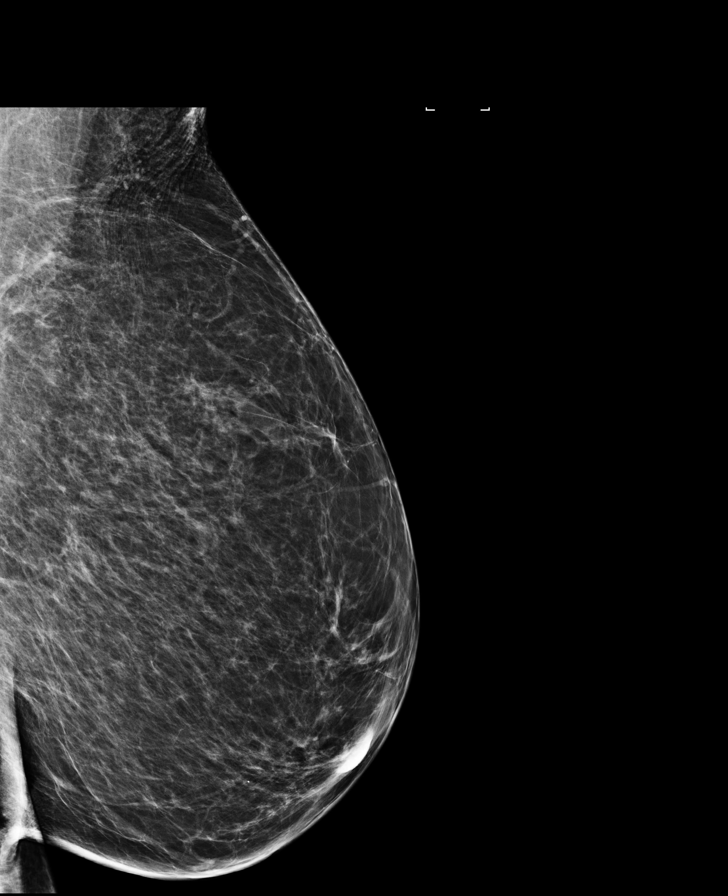

[L CC]
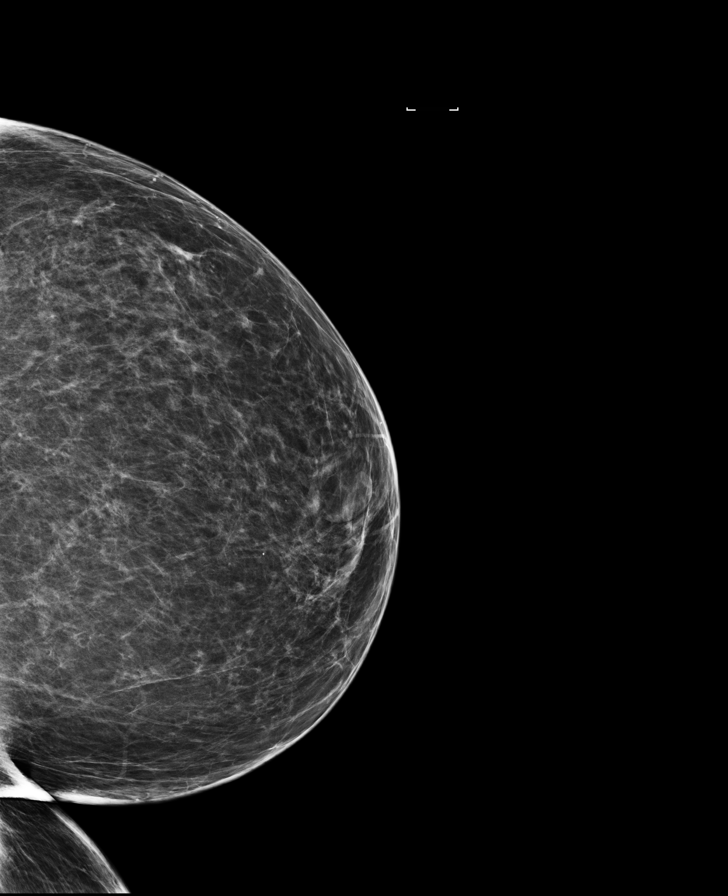

[R CC]
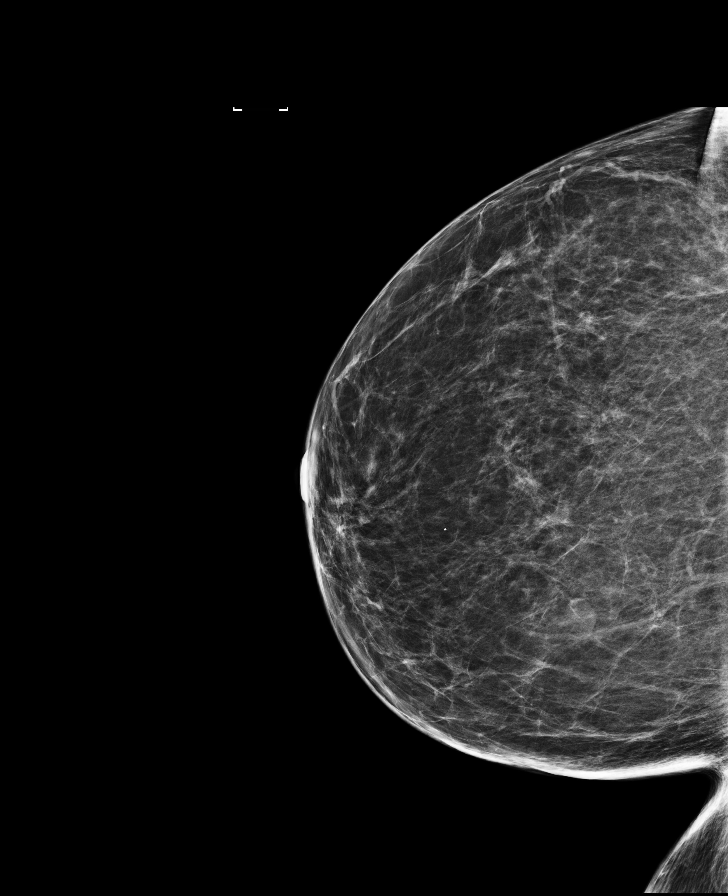

[L MLO synth-2D]
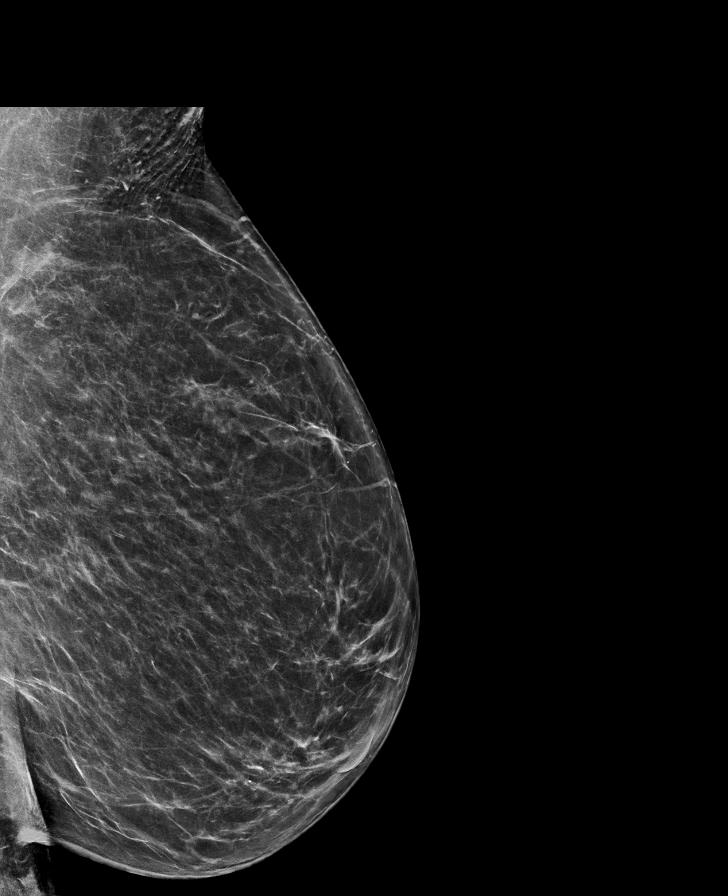

[8 of 29 positions shown; findings below may reference images not displayed]

ACR Breast Density Category b: There are scattered areas of
fibroglandular density.
FINDINGS: There are no findings suspicious for malignancy. Images were
processed with CAD.
IMPRESSION: No mammographic evidence of malignancy. A result letter of this
screening mammogram will be mailed directly to the patient.

RECOMMENDATION:
Screening mammogram in one year. (Code:[4P])

BI-RADS CATEGORY  1: Negative.

## 2017-11-19 ENCOUNTER — Other Ambulatory Visit: Payer: Self-pay | Admitting: Internal Medicine

## 2017-12-29 ENCOUNTER — Ambulatory Visit (INDEPENDENT_AMBULATORY_CARE_PROVIDER_SITE_OTHER): Payer: PPO | Admitting: Internal Medicine

## 2017-12-29 ENCOUNTER — Encounter: Payer: Self-pay | Admitting: Internal Medicine

## 2017-12-29 VITALS — BP 138/80 | HR 72 | Temp 97.9°F | Resp 18 | Wt 140.0 lb

## 2017-12-29 DIAGNOSIS — K219 Gastro-esophageal reflux disease without esophagitis: Secondary | ICD-10-CM | POA: Diagnosis not present

## 2017-12-29 DIAGNOSIS — E7800 Pure hypercholesterolemia, unspecified: Secondary | ICD-10-CM | POA: Diagnosis not present

## 2017-12-29 DIAGNOSIS — R0981 Nasal congestion: Secondary | ICD-10-CM | POA: Diagnosis not present

## 2017-12-29 DIAGNOSIS — I10 Essential (primary) hypertension: Secondary | ICD-10-CM

## 2017-12-29 DIAGNOSIS — E039 Hypothyroidism, unspecified: Secondary | ICD-10-CM

## 2017-12-29 DIAGNOSIS — E119 Type 2 diabetes mellitus without complications: Secondary | ICD-10-CM

## 2017-12-29 NOTE — Progress Notes (Signed)
Patient ID: Mariah Moody, female   DOB: 01-10-1941, 77 y.o.   MRN: [id number]   Subjective:    Patient ID: Mariah Moody, female    DOB: 03/30/1941, 77 y.o.   MRN: [id number]  HPI  Patient here for a scheduled follow up.  Here to follow up regarding her blood pressure.  Has had problems with various medications.  See last note.  Blood pressure elevated last visit.  Benicar increased to 42m q day.  States she is tolerating.  Not itching like she reported at last visit.  Having some drainage.  Some cough related to drainage.  No significant congestion.  No fever.  No sob.  No chest pain.  Bowels moving. No acid reflux.  No abdominal pain.  Overall she feels she is doing well.     Past Medical History:  Diagnosis Date  . Anemia   . Coronary atherosclerosis of native coronary vessel   . [place]   . Fibroid   . GERD (gastroesophageal reflux disease)   . History of chicken pox   . Hyperlipidemia   . Hypertension   . Hypothyroidism    Past Surgical History:  Procedure Laterality Date  . COLONOSCOPY N/A 04/15/2015   Procedure: COLONOSCOPY;  Surgeon: PHulen Luster MD;  [facility];  Service: Gastroenterology;  Laterality: N/A;  . ESOPHAGOGASTRODUODENOSCOPY N/A 04/15/2015   Procedure: ESOPHAGOGASTRODUODENOSCOPY (EGD);  Surgeon: PHulen Luster MD;  [facility];  Service: Gastroenterology;  Laterality: N/A;  . TUBAL LIGATION     Family History  Problem Relation Age of Onset  . Arthritis Mother   . Hypertension Mother   . Colon polyps Mother   . Heart disease Father   . Hypertension Father   . Diabetes Brother   . Cancer Neg Hx   . Breast cancer Neg Hx    Social History   Socioeconomic History  . Marital status: Married    Spouse name: None  . Number of children: None  . Years of education: None  . Highest education level: None  Social Needs  . Financial resource strain: None  . Food insecurity - worry: None  .  Food insecurity - inability: None  . Transportation needs - medical: None  . Transportation needs - non-medical: None  Occupational History  . None  Tobacco Use  . Smoking status: Former SResearch scientist (life sciences) . Smokeless tobacco: Never Used  Substance and Sexual Activity  . Alcohol use: Yes    Alcohol/week: 0.0 oz    Comment: occas  . Drug use: No  . Sexual activity: Not Currently  Other Topics Concern  . None  Social History Narrative  . None    Outpatient Encounter Medications as of 12/29/2017  Medication Sig  . benzonatate (TESSALON PERLES) 100 MG capsule Take 1 capsule (100 mg total) by mouth 3 (three) times daily as needed for cough.  . Calcium Carbonate-Vitamin D (CALCIUM + D PO) Take 1 tablet by mouth daily.  . Cholecalciferol (VITAMIN D PO) Take 1 capsule by mouth daily.   .Marland Kitchenestradiol-norethindrone (COMBIPATCH) 0.05-0.25 MG/DAY Place 1 patch onto the skin 2 (two) times a week. (Patient taking differently: Place 1 patch onto the skin daily. )  . hydrochlorothiazide (HYDRODIURIL) 25 MG tablet TAKE 1 TABLET BY MOUTH EVERY DAY  . levothyroxine (SYNTHROID, LEVOTHROID) 88 MCG tablet Take 1 tablet (88 mcg total) daily by mouth.  . levothyroxine (SYNTHROID, LEVOTHROID) 88 MCG tablet TAKE 1 TABLET BY MOUTH EVERY DAY  .  Multiple Vitamins-Minerals (MULTIVITAMIN PO) Take 1 tablet by mouth daily.   Marland Kitchen olmesartan (BENICAR) 40 MG tablet Take 1 tablet (40 mg total) by mouth daily.  . pantoprazole (PROTONIX) 40 MG tablet TAKE 1 TABLET BY MOUTH DAILY   No facility-administered encounter medications on file as of 12/29/2017.     Review of Systems  Constitutional: Negative for appetite change and unexpected weight change.  HENT: Positive for congestion and postnasal drip. Negative for sinus pressure.   Respiratory: Negative for chest tightness and shortness of breath.        Some cough related to drainage.   Cardiovascular: Negative for chest pain, palpitations and leg swelling.  Gastrointestinal:  Negative for abdominal pain, diarrhea, nausea and vomiting.  Genitourinary: Negative for difficulty urinating and dysuria.  Musculoskeletal: Negative for joint swelling and myalgias.  Skin: Negative for color change and rash.  Neurological: Negative for dizziness, light-headedness and headaches.  Psychiatric/Behavioral: Negative for agitation and dysphoric mood.       Objective:    Physical Exam  Constitutional: She appears well-developed and well-nourished. No distress.  HENT:  Nose: Nose normal.  Mouth/Throat: Oropharynx is clear and moist.  Neck: Neck supple. No thyromegaly present.  Cardiovascular: Normal rate and regular rhythm.  Pulmonary/Chest: Breath sounds normal. No respiratory distress. She has no wheezes.  Abdominal: Soft. Bowel sounds are normal. There is no tenderness.  Musculoskeletal: She exhibits no edema or tenderness.  Lymphadenopathy:    She has no cervical adenopathy.  Skin: No rash noted. No erythema.  Psychiatric: She has a normal mood and affect. Her behavior is normal.    BP 138/80 (BP Location: Left Arm, Patient Position: Sitting, Cuff Size: Normal)   Pulse 72   Temp 97.9 F (36.6 C) (Oral)   Resp 18   Wt 140 lb (63.5 kg)   SpO2 98%   BMI 25.61 kg/m  Wt Readings from Last 3 Encounters:  12/29/17 140 lb (63.5 kg)  10/04/17 141 lb 8 oz (64.2 kg)  08/16/17 141 lb 12.8 oz (64.3 kg)     Lab Results  Component Value Date   WBC 6.4 05/25/2017   HGB 13.0 05/25/2017   HCT 38.6 05/25/2017   PLT 263.0 05/25/2017   GLUCOSE 159 (H) 09/20/2017   CHOL 204 (H) 09/20/2017   TRIG 350.0 (H) 09/20/2017   HDL 31.20 (L) 09/20/2017   LDLDIRECT 92.0 09/20/2017   LDLCALC 135 (H) 02/25/2016   ALT 11 09/20/2017   AST 14 09/20/2017   NA 141 09/20/2017   K 4.1 09/20/2017   CL 106 09/20/2017   CREATININE 0.73 09/20/2017   BUN 21 09/20/2017   CO2 29 09/20/2017   TSH 2.55 09/20/2017   HGBA1C 6.9 (H) 09/20/2017   MICROALBUR 0.7 01/06/2017    Mm Screening  Breast Tomo Bilateral  Result Date: 11/08/2017 CLINICAL DATA:  Screening. EXAM: 2D DIGITAL SCREENING BILATERAL MAMMOGRAM WITH 3D TOMO WITH CAD COMPARISON:  Previous exam(s). ACR Breast Density Category b: There are scattered areas of fibroglandular density. FINDINGS: There are no findings suspicious for malignancy. Images were processed with CAD. IMPRESSION: No mammographic evidence of malignancy. A result letter of this screening mammogram will be mailed directly to the patient. RECOMMENDATION: Screening mammogram in one year. (Code:SM-B-01Y) BI-RADS CATEGORY  1: Negative. Electronically Signed   By: Lovey Newcomer M.D.   On: 11/08/2017 08:53       Assessment & Plan:   Problem List Items Addressed This Visit    Diabetes mellitus type II, controlled, with  no complications (HCC)    Low carb diet and exercise.  Follow met b and a1c.       Relevant Orders   Hemoglobin A1c   Microalbumin / creatinine urine ratio   Essential hypertension, benign    On benicar and tolerating.  Blood pressure overall doing better.  Continue same medication regimen.  Follow pressures.  Follow metabolic panel.        Relevant Orders   Basic metabolic panel   GERD (gastroesophageal reflux disease)    Controlled on omeprazole.        Hypercholesterolemia    Low cholesterol diet and exercise.  Follow lipid panel.        Relevant Orders   Lipid panel   Hepatic function panel   Hypothyroidism    On thyroid replacement.  Follow tsh.        Relevant Orders   TSH    Other Visit Diagnoses    Nasal congestion    -  Primary   Symptoms as outlined.  Nasacort nasal spray as directed.  Follow.         Einar Pheasant, MD

## 2017-12-29 NOTE — Patient Instructions (Signed)
nasacort nasal spray - 2 sprays each nostril one time per day.  Do this in the evening.   

## 2018-01-01 ENCOUNTER — Encounter: Payer: Self-pay | Admitting: Internal Medicine

## 2018-01-01 NOTE — Assessment & Plan Note (Signed)
On benicar and tolerating.  Blood pressure overall doing better.  Continue same medication regimen.  Follow pressures.  Follow metabolic panel.

## 2018-01-01 NOTE — Assessment & Plan Note (Signed)
On thyroid replacement.  Follow tsh.  

## 2018-01-01 NOTE — Assessment & Plan Note (Signed)
Controlled on omeprazole.   

## 2018-01-01 NOTE — Assessment & Plan Note (Signed)
Low cholesterol diet and exercise.  Follow lipid panel.   

## 2018-01-01 NOTE — Assessment & Plan Note (Signed)
Low carb diet and exercise.  Follow met b and a1c.  

## 2018-01-19 ENCOUNTER — Other Ambulatory Visit: Payer: Self-pay | Admitting: Internal Medicine

## 2018-04-04 ENCOUNTER — Other Ambulatory Visit: Payer: PPO

## 2018-04-11 ENCOUNTER — Encounter: Payer: PPO | Admitting: Internal Medicine

## 2018-04-12 ENCOUNTER — Other Ambulatory Visit: Payer: Self-pay | Admitting: Internal Medicine

## 2018-04-13 MED ORDER — LEVOTHYROXINE SODIUM 88 MCG PO TABS: 88.0000 ug | ORAL_TABLET | Freq: Every day | ORAL | 0 refills | Status: DC

## 2018-04-14 ENCOUNTER — Other Ambulatory Visit: Payer: Self-pay | Admitting: Internal Medicine

## 2018-05-13 ENCOUNTER — Other Ambulatory Visit: Payer: Self-pay

## 2018-05-13 ENCOUNTER — Other Ambulatory Visit: Payer: Self-pay | Admitting: Internal Medicine

## 2018-05-13 MED ORDER — ESTRADIOL-NORETHINDRONE ACET 0.05-0.25 MG/DAY TD PTTW: 1.0000 | MEDICATED_PATCH | TRANSDERMAL | 1 refills | Status: DC

## 2018-05-13 NOTE — Telephone Encounter (Signed)
Please advise of ok to refill?

## 2018-05-13 NOTE — Telephone Encounter (Unsigned)
Copied from [place] [phone]. Topic: Quick Communication - Rx Refill/Question >> May 13, 2018 11:54 AM Percell Belt A wrote: Medication: estradiol-norethindrone [facility]) 0.05-0.25 MG/DAY [[id number]]  Has the patient contacted their pharmacy? No  (Agent: If no, request that the patient contact the pharmacy for the refill.) (Agent: If yes, when and what did the pharmacy advise?)  Preferred Pharmacy (with phone number or street name): [place]: Please be advised that RX refills may take up to 3 business days. We ask that you follow-up with your pharmacy.

## 2018-05-13 NOTE — Telephone Encounter (Signed)
I am ok with refill for combipatch x 1.  Keep scheduled appt.  I was going to send in, but was not sure which [facility] ([place] [facility] [place] to send.  Thanks

## 2018-05-21 ENCOUNTER — Other Ambulatory Visit: Payer: Self-pay | Admitting: Internal Medicine

## 2018-05-23 ENCOUNTER — Telehealth: Payer: Self-pay | Admitting: Internal Medicine

## 2018-05-23 ENCOUNTER — Other Ambulatory Visit: Payer: Self-pay

## 2018-05-23 NOTE — Telephone Encounter (Signed)
Can we send patches to [place]?

## 2018-05-23 NOTE — Telephone Encounter (Signed)
Copied from [place] [id number]. Topic: Quick Communication - Rx Refill/Question >> May 23, 2018 11:08 AM Burchel, Abbi R wrote: Medication: estradiol-norethindrone [facility]) 0.05-0.25 MG/DAY  Preferred Pharmacy: [facility] ([place]) Phone: [phone] Fax: [phone] or [phone]  Pt requested this medication 2 weeks ago, and it was sent to the wrong pharmacy.  Please re-fax to the correct pharmacy.  [facility]

## 2018-05-24 NOTE — Telephone Encounter (Signed)
We cannot sent rx to [place].  See me before calling pt.

## 2018-05-24 NOTE — Telephone Encounter (Signed)
Left message for patient to call back  

## 2018-05-25 NOTE — Telephone Encounter (Signed)
See me about this.  

## 2018-05-25 NOTE — Telephone Encounter (Signed)
Patient is returning [place] phone call. Please contact.   [phone]

## 2018-05-25 NOTE — Telephone Encounter (Signed)
We can try faxing to [place] if you are willing.

## 2018-05-30 ENCOUNTER — Telehealth: Payer: Self-pay

## 2018-05-30 ENCOUNTER — Other Ambulatory Visit: Payer: Self-pay

## 2018-05-30 MED ORDER — ESTRADIOL-NORETHINDRONE ACET 0.05-0.25 MG/DAY TD PTTW: 1.0000 | MEDICATED_PATCH | TRANSDERMAL | 1 refills | Status: DC

## 2018-05-30 NOTE — Telephone Encounter (Signed)
Signed and placed in box.   

## 2018-05-30 NOTE — Telephone Encounter (Signed)
Copied from [place] [phone]. Topic: Inquiry >> May 30, 2018  3:54 PM Pricilla Handler wrote: Reason for CRM: Patient called to check on the status of her Estradiol-norethindrone [facility]) 0.05-0.25 MG/DAY. Patient states that she has been wating for the last two weeks, but have not received the medication. Patient would like a call back today regarding the status.       Thank You!!!

## 2018-05-30 NOTE — Telephone Encounter (Signed)
See other phone note

## 2018-05-30 NOTE — Telephone Encounter (Signed)
Please see previous phone encounter patient is needing an rx for Combipatch, she states that she can have her husband pick up the rx around 1:00 tomorrow ( 05-31-18)

## 2018-05-30 NOTE — Telephone Encounter (Signed)
Printed rx for you to sign 

## 2018-05-31 NOTE — Telephone Encounter (Signed)
rx placed up front

## 2018-07-11 ENCOUNTER — Other Ambulatory Visit (INDEPENDENT_AMBULATORY_CARE_PROVIDER_SITE_OTHER): Payer: PPO

## 2018-07-11 DIAGNOSIS — E7800 Pure hypercholesterolemia, unspecified: Secondary | ICD-10-CM | POA: Diagnosis not present

## 2018-07-11 DIAGNOSIS — E119 Type 2 diabetes mellitus without complications: Secondary | ICD-10-CM | POA: Diagnosis not present

## 2018-07-11 DIAGNOSIS — E039 Hypothyroidism, unspecified: Secondary | ICD-10-CM | POA: Diagnosis not present

## 2018-07-11 DIAGNOSIS — I10 Essential (primary) hypertension: Secondary | ICD-10-CM

## 2018-07-11 LAB — HEPATIC FUNCTION PANEL
ALT: 12 U/L (ref 0–35)
AST: 12 U/L (ref 0–37)
Albumin: 4.1 g/dL (ref 3.5–5.2)
Alkaline Phosphatase: 69 U/L (ref 39–117)
BILIRUBIN TOTAL: 1.2 mg/dL (ref 0.2–1.2)
Bilirubin, Direct: 0.1 mg/dL (ref 0.0–0.3)
TOTAL PROTEIN: 6.6 g/dL (ref 6.0–8.3)

## 2018-07-11 LAB — BASIC METABOLIC PANEL
BUN: 21 mg/dL (ref 6–23)
CALCIUM: 9.7 mg/dL (ref 8.4–10.5)
CO2: 27 mEq/L (ref 19–32)
CREATININE: 0.77 mg/dL (ref 0.40–1.20)
Chloride: 104 mEq/L (ref 96–112)
GFR: 77.61 mL/min (ref >60.00)
Glucose, Bld: 146 mg/dL — ABNORMAL HIGH (ref 70–99)
Potassium: 3.7 mEq/L (ref 3.5–5.1)
Sodium: 139 mEq/L (ref 135–145)

## 2018-07-11 LAB — TSH: TSH: 3.7 u[IU]/mL (ref 0.35–4.50)

## 2018-07-11 LAB — LIPID PANEL
CHOLESTEROL: 202 mg/dL — AB (ref 0–200)
HDL: 32.5 mg/dL — ABNORMAL LOW (ref >39.00)
NonHDL: 169.25
TRIGLYCERIDES: 368 mg/dL — AB (ref 0.0–149.0)
Total CHOL/HDL Ratio: 6
VLDL: 73.6 mg/dL — AB (ref 0.0–40.0)

## 2018-07-11 LAB — MICROALBUMIN / CREATININE URINE RATIO
CREATININE, U: 82.8 mg/dL
MICROALB/CREAT RATIO: 0.8 mg/g (ref 0.0–30.0)
Microalb, Ur: <0.7 mg/dL (ref 0.0–1.9)

## 2018-07-11 LAB — LDL CHOLESTEROL, DIRECT: LDL DIRECT: 104 mg/dL

## 2018-07-11 LAB — HEMOGLOBIN A1C: HEMOGLOBIN A1C: 7 % — AB (ref 4.6–6.5)

## 2018-07-25 ENCOUNTER — Encounter: Payer: PPO | Admitting: Internal Medicine

## 2018-07-25 NOTE — Telephone Encounter (Signed)
CB# [id number]

## 2018-07-25 NOTE — Telephone Encounter (Signed)
Called and scheduled pt

## 2018-07-25 NOTE — Telephone Encounter (Signed)
Pt is calling in to reschedule physical appointment. Pt states she has had to reschedule multiple times

## 2018-07-25 NOTE — Telephone Encounter (Signed)
Can you schedule her? I think she is one of the patients we were trying to move last week

## 2018-09-26 ENCOUNTER — Encounter: Payer: Self-pay | Admitting: Internal Medicine

## 2018-09-26 ENCOUNTER — Ambulatory Visit (INDEPENDENT_AMBULATORY_CARE_PROVIDER_SITE_OTHER): Payer: PPO | Admitting: Internal Medicine

## 2018-09-26 VITALS — BP 138/82 | HR 69 | Temp 97.8°F | Resp 18 | Ht 62.0 in | Wt 141.6 lb

## 2018-09-26 DIAGNOSIS — E7800 Pure hypercholesterolemia, unspecified: Secondary | ICD-10-CM | POA: Diagnosis not present

## 2018-09-26 DIAGNOSIS — K219 Gastro-esophageal reflux disease without esophagitis: Secondary | ICD-10-CM

## 2018-09-26 DIAGNOSIS — I10 Essential (primary) hypertension: Secondary | ICD-10-CM | POA: Diagnosis not present

## 2018-09-26 DIAGNOSIS — E039 Hypothyroidism, unspecified: Secondary | ICD-10-CM | POA: Diagnosis not present

## 2018-09-26 DIAGNOSIS — Z1239 Encounter for other screening for malignant neoplasm of breast: Secondary | ICD-10-CM

## 2018-09-26 DIAGNOSIS — E119 Type 2 diabetes mellitus without complications: Secondary | ICD-10-CM | POA: Diagnosis not present

## 2018-09-26 DIAGNOSIS — Z0000 Encounter for general adult medical examination without abnormal findings: Secondary | ICD-10-CM

## 2018-09-26 NOTE — Progress Notes (Signed)
Patient ID: Mariah Moody, female   DOB: 12/03/1940, 77 y.o.   MRN: [id number]   Subjective:    Patient ID: Mariah Moody, female    DOB: May 26, 1941, 78 y.o.   MRN: [id number]  HPI  Patient here for her physical exam.  She reports she is doing relatively well.  Some hand stiffness.  Discussed with her today.  Discussed exercises.  No chest pain.  Tries to stay active.  No sob.  No acid reflux.  No abdominal pain.  Bowels moving.  No urine change.  Overall feels she is doing relatively well. Discussed recent labs.   Discussed a1c 7.0.  She declines cholesterol medication.  Discussed diabetic medication.  Wants to work on diet and exercise.  States her blood pressures have been averaging 287G systolic.     Past Medical History:  Diagnosis Date  . Anemia   . Coronary atherosclerosis of native coronary vessel   . Diabetes mellitus without complication ([place])   . Fibroid   . GERD (gastroesophageal reflux disease)   . History of chicken pox   . Hyperlipidemia   . Hypertension   . Hypothyroidism    Past Surgical History:  Procedure Laterality Date  . COLONOSCOPY N/A 04/15/2015   Procedure: COLONOSCOPY;  Surgeon: Hulen Luster, MD;  Location: [facility]COPY;  Service: Gastroenterology;  Laterality: N/A;  . ESOPHAGOGASTRODUODENOSCOPY N/A 04/15/2015   Procedure: ESOPHAGOGASTRODUODENOSCOPY (EGD);  Surgeon: Hulen Luster, MD;  Location: [facility] ENDOSCOPY;  Service: Gastroenterology;  Laterality: N/A;  . TUBAL LIGATION     Family History  Problem Relation Age of Onset  . Arthritis Mother   . Hypertension Mother   . Colon polyps Mother   . Heart disease Father   . Hypertension Father   . Diabetes Brother   . Cancer Neg Hx   . Breast cancer Neg Hx    Social History   Socioeconomic History  . Marital status: Married    Spouse name: Not on file  . Number of children: Not on file  . Years of education: Not on file  . Highest education level: Not on file  Occupational History  . Not on  file  Social Needs  . Financial resource strain: Not on file  . Food insecurity:    Worry: Not on file    Inability: Not on file  . Transportation needs:    Medical: Not on file    Non-medical: Not on file  Tobacco Use  . Smoking status: Former Research scientist (life sciences)  . Smokeless tobacco: Never Used  Substance and Sexual Activity  . Alcohol use: Yes    Alcohol/week: 0.0 standard drinks    Comment: occas  . Drug use: No  . Sexual activity: Not Currently  Lifestyle  . Physical activity:    Days per week: Not on file    Minutes per session: Not on file  . Stress: Not on file  Relationships  . Social connections:    Talks on phone: Not on file    Gets together: Not on file    Attends religious service: Not on file    Active member of club or organization: Not on file    Attends meetings of clubs or organizations: Not on file    Relationship status: Not on file  Other Topics Concern  . Not on file  Social History Narrative  . Not on file    Outpatient Encounter Medications as of 09/26/2018  Medication Sig  . Calcium Carbonate-Vitamin D (CALCIUM +  D PO) Take 1 tablet by mouth daily.  . Cholecalciferol (VITAMIN D PO) Take 1 capsule by mouth daily.   Marland Kitchen estradiol-norethindrone (COMBIPATCH) 0.05-0.25 MG/DAY Place 1 patch onto the skin 2 (two) times a week.  . hydrochlorothiazide (HYDRODIURIL) 25 MG tablet TAKE 1 TABLET BY MOUTH EVERY DAY  . levothyroxine (SYNTHROID, LEVOTHROID) 88 MCG tablet Take 1 tablet (88 mcg total) by mouth daily before breakfast.  . Multiple Vitamins-Minerals (MULTIVITAMIN PO) Take 1 tablet by mouth daily.   Marland Kitchen olmesartan (BENICAR) 40 MG tablet TAKE 1 TABLET BY MOUTH EVERY DAY  . pantoprazole (PROTONIX) 40 MG tablet TAKE 1 TABLET BY MOUTH DAILY  . [DISCONTINUED] benzonatate (TESSALON PERLES) 100 MG capsule Take 1 capsule (100 mg total) by mouth 3 (three) times daily as needed for cough.  . [DISCONTINUED] levothyroxine (SYNTHROID, LEVOTHROID) 88 MCG tablet Take 1 tablet (88  mcg total) daily by mouth.  . [DISCONTINUED] levothyroxine (SYNTHROID, LEVOTHROID) 88 MCG tablet TAKE 1 TABLET BY MOUTH EVERY DAY   No facility-administered encounter medications on file as of 09/26/2018.     Review of Systems  Constitutional: Negative for appetite change and unexpected weight change.  HENT: Negative for congestion and sinus pressure.   Eyes: Negative for pain and visual disturbance.  Respiratory: Negative for cough, chest tightness and shortness of breath.   Cardiovascular: Negative for chest pain, palpitations and leg swelling.  Gastrointestinal: Negative for abdominal pain, diarrhea, nausea and vomiting.  Genitourinary: Negative for difficulty urinating and dysuria.  Musculoskeletal: Negative for joint swelling and myalgias.  Skin: Negative for color change and rash.  Neurological: Negative for dizziness, light-headedness and headaches.  Hematological: Negative for adenopathy. Does not bruise/bleed easily.  Psychiatric/Behavioral: Negative for agitation and dysphoric mood.       Objective:    Physical Exam  Constitutional: She is oriented to person, place, and time. She appears well-developed and well-nourished. No distress.  HENT:  Nose: Nose normal.  Mouth/Throat: Oropharynx is clear and moist.  Eyes: Right eye exhibits no discharge. Left eye exhibits no discharge. No scleral icterus.  Neck: Neck supple. No thyromegaly present.  Cardiovascular: Normal rate and regular rhythm.  Pulmonary/Chest: Breath sounds normal. No accessory muscle usage. No tachypnea. No respiratory distress. She has no decreased breath sounds. She has no wheezes. She has no rhonchi. Right breast exhibits no inverted nipple, no mass, no nipple discharge and no tenderness (no axillary adenopathy). Left breast exhibits no inverted nipple, no mass, no nipple discharge and no tenderness (no axilarry adenopathy).  Abdominal: Soft. Bowel sounds are normal. There is no tenderness.    Musculoskeletal: She exhibits no edema or tenderness.  Lymphadenopathy:    She has no cervical adenopathy.  Neurological: She is alert and oriented to person, place, and time.  Skin: No rash noted. No erythema.  Psychiatric: She has a normal mood and affect. Her behavior is normal.    BP 138/82 (BP Location: Left Arm, Patient Position: Sitting, Cuff Size: Normal)   Pulse 69   Temp 97.8 F (36.6 C) (Oral)   Resp 18   Ht '5\' 2"'  (1.575 m)   Wt 141 lb 9.6 oz (64.2 kg)   SpO2 97%   BMI 25.90 kg/m  Wt Readings from Last 3 Encounters:  09/26/18 141 lb 9.6 oz (64.2 kg)  12/29/17 140 lb (63.5 kg)  10/04/17 141 lb 8 oz (64.2 kg)     Lab Results  Component Value Date   WBC 6.4 05/25/2017   HGB 13.0 05/25/2017  HCT 38.6 05/25/2017   PLT 263.0 05/25/2017   GLUCOSE 146 (H) 07/11/2018   CHOL 202 (H) 07/11/2018   TRIG 368.0 (H) 07/11/2018   HDL 32.50 (L) 07/11/2018   LDLDIRECT 104.0 07/11/2018   LDLCALC 135 (H) 02/25/2016   ALT 12 07/11/2018   AST 12 07/11/2018   NA 139 07/11/2018   K 3.7 07/11/2018   CL 104 07/11/2018   CREATININE 0.77 07/11/2018   BUN 21 07/11/2018   CO2 27 07/11/2018   TSH 3.70 07/11/2018   HGBA1C 7.0 (H) 07/11/2018   MICROALBUR <0.7 07/11/2018    Mm Screening Breast Tomo Bilateral  Result Date: 11/08/2017 CLINICAL DATA:  Screening. EXAM: 2D DIGITAL SCREENING BILATERAL MAMMOGRAM WITH 3D TOMO WITH CAD COMPARISON:  Previous exam(s). ACR Breast Density Category b: There are scattered areas of fibroglandular density. FINDINGS: There are no findings suspicious for malignancy. Images were processed with CAD. IMPRESSION: No mammographic evidence of malignancy. A result letter of this screening mammogram will be mailed directly to the patient. RECOMMENDATION: Screening mammogram in one year. (Code:SM-B-01Y) BI-RADS CATEGORY  1: Negative. Electronically Signed   By: Lovey Newcomer M.D.   On: 11/08/2017 08:53       Assessment & Plan:   Problem List Items Addressed  This Visit    Diabetes mellitus type II, controlled, with no complications ([place])    Discussed diet and exercise.  Last a1c 7.0.  Discussed medication.  She desires to work on her diet and exercise.  Follow met b and a1c.        Relevant Orders   Hemoglobin M0N   Basic metabolic panel   Essential hypertension, benign    Blood pressures as outlined.  Continue same medication regimen.  Follow pressures.  Follow metabolic panel.        Relevant Orders   CBC with Differential/Platelet   GERD (gastroesophageal reflux disease)    Controlled on current regimen.  Follow.        Health care maintenance    Physical today 09/26/18.  Mammogram 11/08/17 - Birads I.  Schedule f/u mammogram.  Colonoscopy 04/2015.  States told her did not need another colonoscopy.  Discussed shingrx.        Hypercholesterolemia    Low cholesterol diet and exercise.  Discussed calculated cholesterol risk.  Declines cholesterol medication.  Follow lipid panel.        Relevant Orders   Hepatic function panel   Lipid panel   Hypothyroidism    On thyroid replacement.  Follow tsh.         Other Visit Diagnoses    Routine general medical examination at a health care facility    -  Primary   Breast cancer screening       Relevant Orders   MM 3D SCREEN BREAST BILATERAL       Einar Pheasant, MD

## 2018-09-26 NOTE — Assessment & Plan Note (Signed)
Physical today 09/26/18.  Mammogram 11/08/17 - Birads I.  Schedule f/u mammogram.  Colonoscopy 04/2015.  States told her did not need another colonoscopy.  Discussed shingrx.

## 2018-10-01 ENCOUNTER — Other Ambulatory Visit: Payer: Self-pay | Admitting: Internal Medicine

## 2018-10-02 ENCOUNTER — Encounter: Payer: Self-pay | Admitting: Internal Medicine

## 2018-10-02 NOTE — Assessment & Plan Note (Signed)
Controlled on current regimen.  Follow.  

## 2018-10-02 NOTE — Assessment & Plan Note (Signed)
On thyroid replacement.  Follow tsh.  

## 2018-10-02 NOTE — Assessment & Plan Note (Signed)
Blood pressures as outlined.  Continue same medication regimen.  Follow pressures.  Follow metabolic panel.  

## 2018-10-02 NOTE — Assessment & Plan Note (Signed)
Discussed diet and exercise.  Last a1c 7.0.  Discussed medication.  She desires to work on her diet and exercise.  Follow met b and a1c.

## 2018-10-02 NOTE — Assessment & Plan Note (Signed)
Low cholesterol diet and exercise.  Discussed calculated cholesterol risk.  Declines cholesterol medication.  Follow lipid panel.

## 2018-10-05 DIAGNOSIS — J0100 Acute maxillary sinusitis, unspecified: Secondary | ICD-10-CM | POA: Diagnosis not present

## 2018-10-05 DIAGNOSIS — J029 Acute pharyngitis, unspecified: Secondary | ICD-10-CM | POA: Diagnosis not present

## 2018-10-05 DIAGNOSIS — R05 Cough: Secondary | ICD-10-CM | POA: Diagnosis not present

## 2018-11-23 ENCOUNTER — Other Ambulatory Visit: Payer: Self-pay | Admitting: Internal Medicine

## 2018-11-25 ENCOUNTER — Other Ambulatory Visit: Payer: Self-pay | Admitting: Internal Medicine

## 2018-11-28 ENCOUNTER — Other Ambulatory Visit (INDEPENDENT_AMBULATORY_CARE_PROVIDER_SITE_OTHER): Payer: PPO

## 2018-11-28 ENCOUNTER — Encounter: Payer: Self-pay | Admitting: Internal Medicine

## 2018-11-28 DIAGNOSIS — I10 Essential (primary) hypertension: Secondary | ICD-10-CM | POA: Diagnosis not present

## 2018-11-28 DIAGNOSIS — E119 Type 2 diabetes mellitus without complications: Secondary | ICD-10-CM

## 2018-11-28 DIAGNOSIS — E7800 Pure hypercholesterolemia, unspecified: Secondary | ICD-10-CM | POA: Diagnosis not present

## 2018-11-28 LAB — HEPATIC FUNCTION PANEL
ALT: 11 U/L (ref 0–35)
AST: 13 U/L (ref 0–37)
Albumin: 4.1 g/dL (ref 3.5–5.2)
Alkaline Phosphatase: 79 U/L (ref 39–117)
Bilirubin, Direct: 0.1 mg/dL (ref 0.0–0.3)
Total Bilirubin: 1 mg/dL (ref 0.2–1.2)
Total Protein: 6.7 g/dL (ref 6.0–8.3)

## 2018-11-28 LAB — CBC WITH DIFFERENTIAL/PLATELET
Basophils Absolute: 0 10*3/uL (ref 0.0–0.1)
Basophils Relative: 0.8 % (ref 0.0–3.0)
Eosinophils Absolute: 0.1 10*3/uL (ref 0.0–0.7)
Eosinophils Relative: 2.4 % (ref 0.0–5.0)
HCT: 39.6 % (ref 36.0–46.0)
Hemoglobin: 13.5 g/dL (ref 12.0–15.0)
LYMPHS ABS: 1.6 10*3/uL (ref 0.7–4.0)
Lymphocytes Relative: 30.4 % (ref 12.0–46.0)
MCHC: 34.1 g/dL (ref 30.0–36.0)
MCV: 90.1 fl (ref 78.0–100.0)
Monocytes Absolute: 0.5 10*3/uL (ref 0.1–1.0)
Monocytes Relative: 10.2 % (ref 3.0–12.0)
NEUTROS PCT: 56.2 % (ref 43.0–77.0)
Neutro Abs: 2.9 10*3/uL (ref 1.4–7.7)
Platelets: 255 10*3/uL (ref 150.0–400.0)
RBC: 4.4 Mil/uL (ref 3.87–5.11)
RDW: 12.4 % (ref 11.5–15.5)
WBC: 5.2 10*3/uL (ref 4.0–10.5)

## 2018-11-28 LAB — BASIC METABOLIC PANEL
BUN: 20 mg/dL (ref 6–23)
CO2: 29 mEq/L (ref 19–32)
CREATININE: 0.82 mg/dL (ref 0.40–1.20)
Calcium: 9.6 mg/dL (ref 8.4–10.5)
Chloride: 102 mEq/L (ref 96–112)
GFR: 67.83 mL/min (ref >60.00)
GLUCOSE: 155 mg/dL — AB (ref 70–99)
Potassium: 4 mEq/L (ref 3.5–5.1)
Sodium: 139 mEq/L (ref 135–145)

## 2018-11-28 LAB — LIPID PANEL
Cholesterol: 216 mg/dL — ABNORMAL HIGH (ref 0–200)
HDL: 32.4 mg/dL — ABNORMAL LOW (ref >39.00)
NonHDL: 184.02
Total CHOL/HDL Ratio: 7
Triglycerides: 292 mg/dL — ABNORMAL HIGH (ref 0.0–149.0)
VLDL: 58.4 mg/dL — ABNORMAL HIGH (ref 0.0–40.0)

## 2018-11-28 LAB — LDL CHOLESTEROL, DIRECT: Direct LDL: 115 mg/dL

## 2018-11-28 LAB — HEMOGLOBIN A1C: HEMOGLOBIN A1C: 7 % — AB (ref 4.6–6.5)

## 2018-12-05 ENCOUNTER — Ambulatory Visit
Admission: RE | Admit: 2018-12-05 | Discharge: 2018-12-05 | Disposition: A | Discharge status: Home / Self Care | Payer: PPO | Source: Ambulatory Visit | Attending: Internal Medicine | Admitting: Internal Medicine

## 2018-12-05 DIAGNOSIS — Z1239 Encounter for other screening for malignant neoplasm of breast: Secondary | ICD-10-CM

## 2018-12-05 DIAGNOSIS — Z1231 Encounter for screening mammogram for malignant neoplasm of breast: Secondary | ICD-10-CM | POA: Diagnosis not present

## 2018-12-05 IMAGING — MG DIGITAL SCREENING BILATERAL MAMMOGRAM WITH TOMO AND CAD
6 of 10 series · 6 of 30 positions shown · non-contrast
Comparison: Previous exam(s).

CLINICAL DATA: Screening.

EXAM:
DIGITAL SCREENING BILATERAL MAMMOGRAM WITH TOMO AND CAD

[R MLO synth-2D]
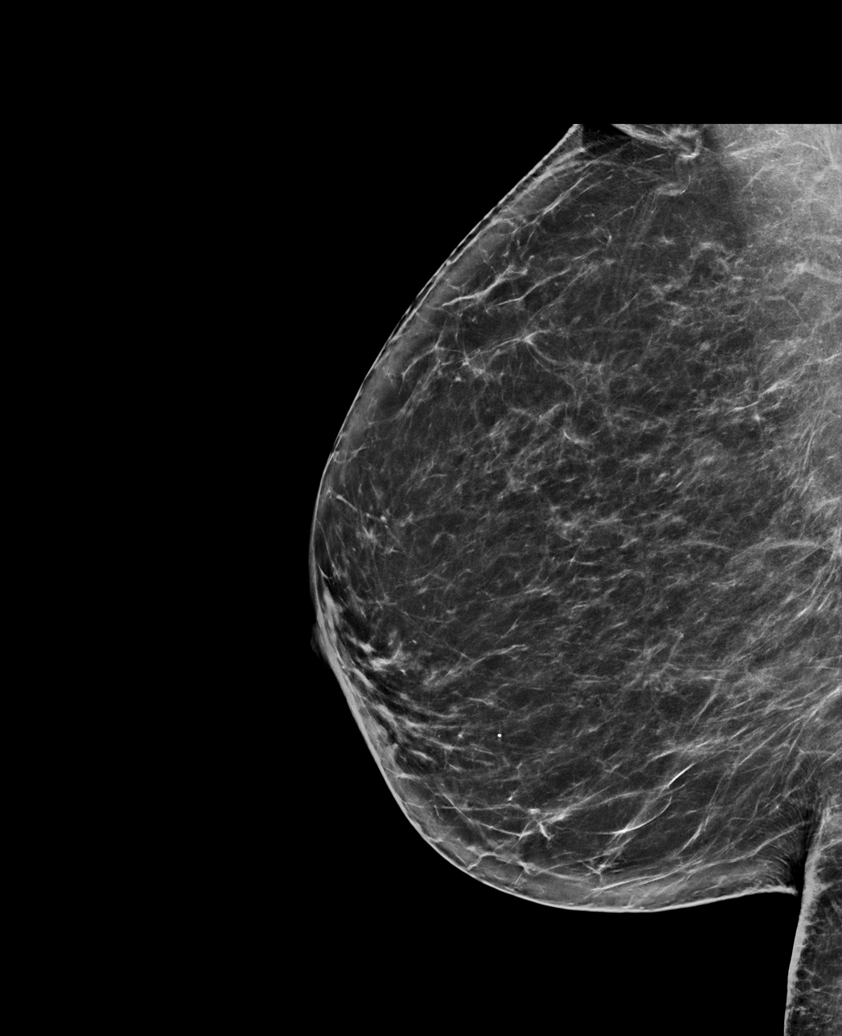

[R CC synth-2D]
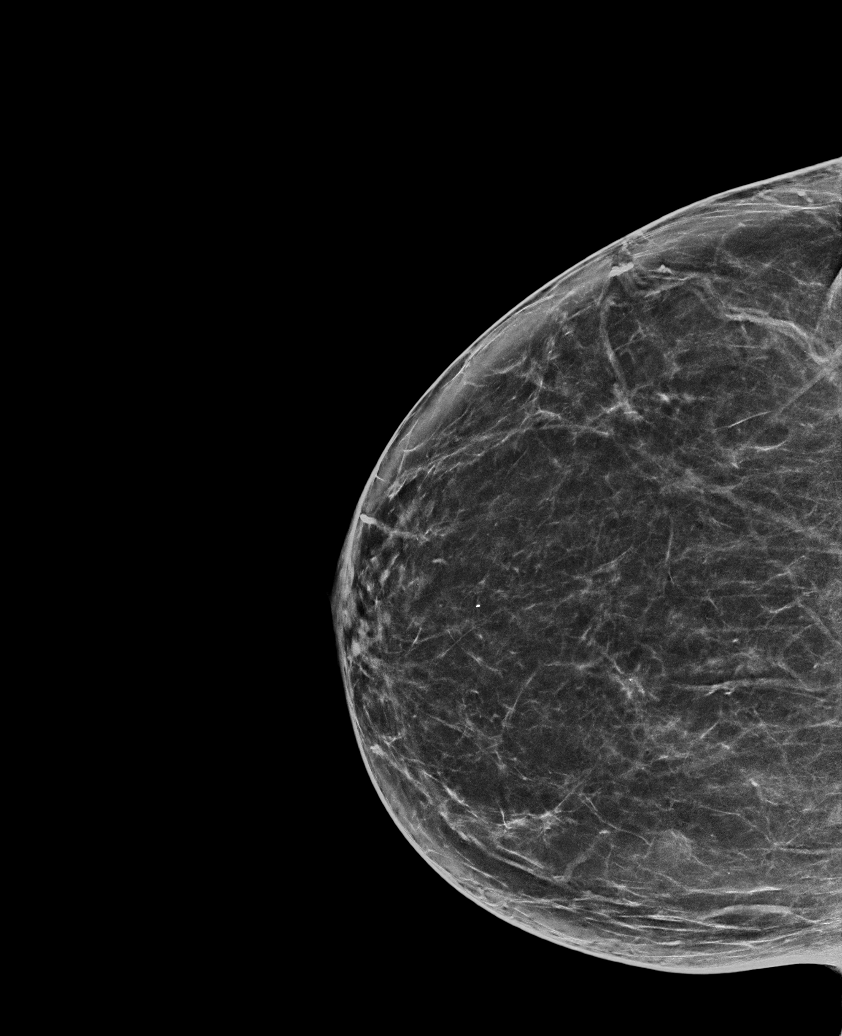

[L MLO synth-2D]
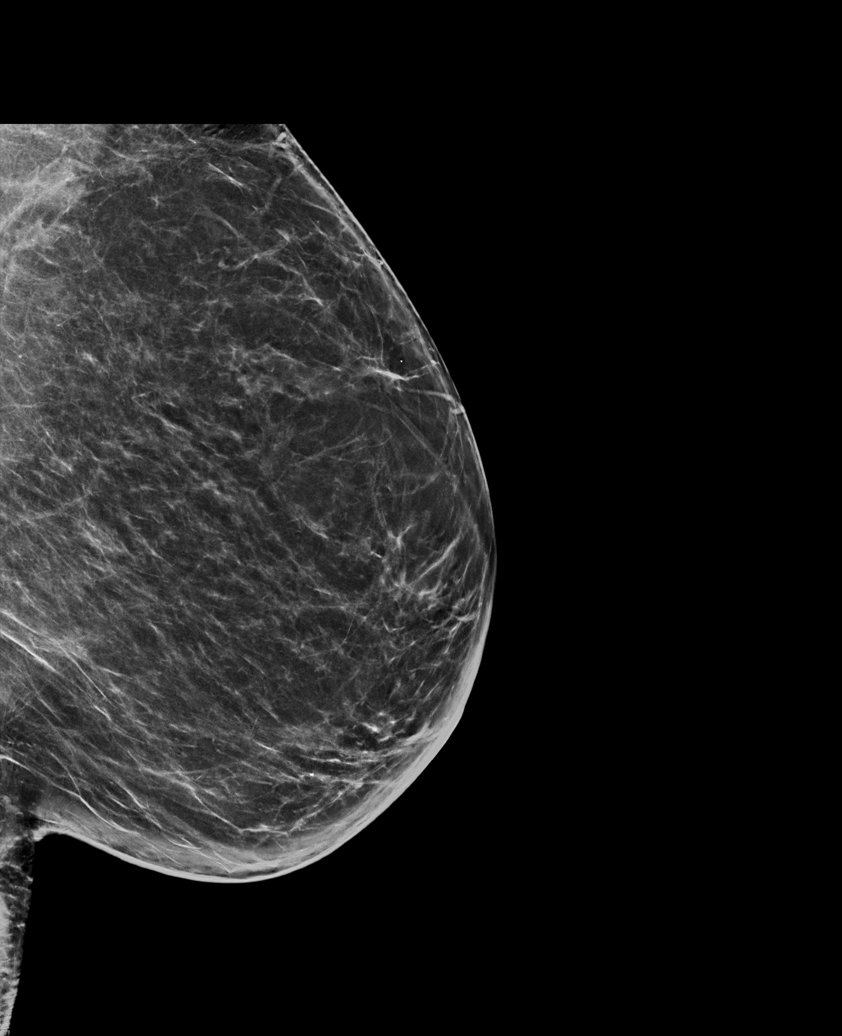

[L XCCL synth-2D]
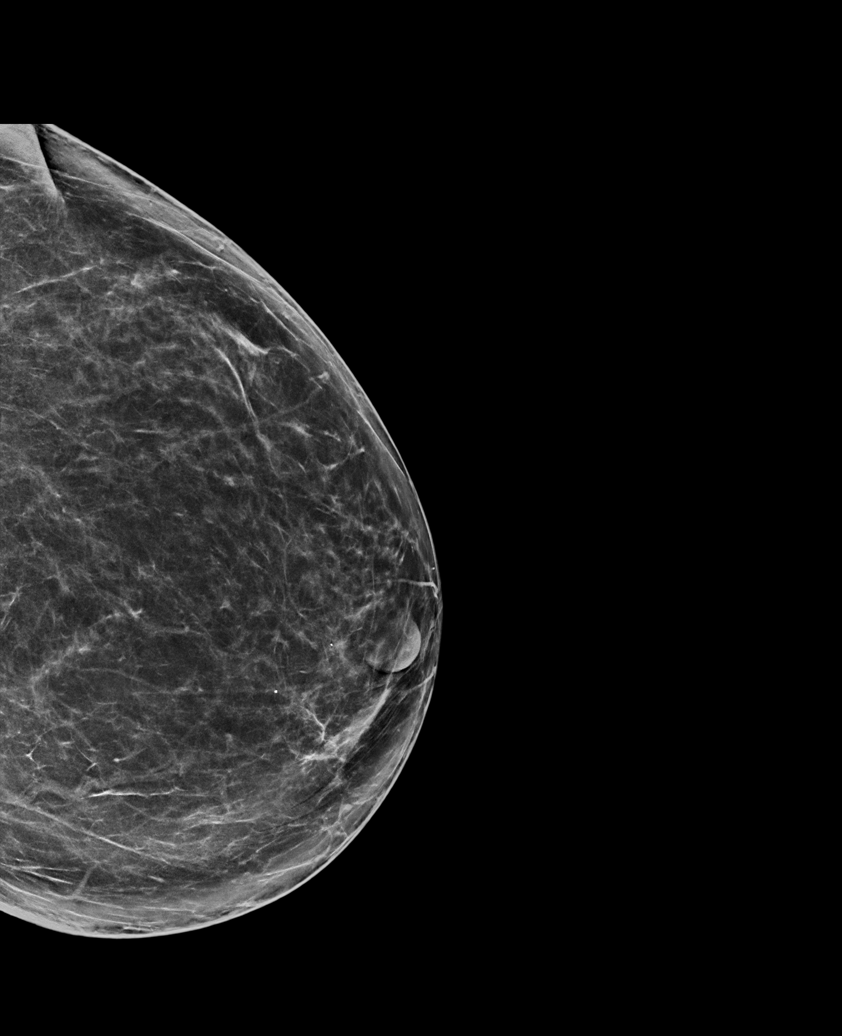

[L CC synth-2D]
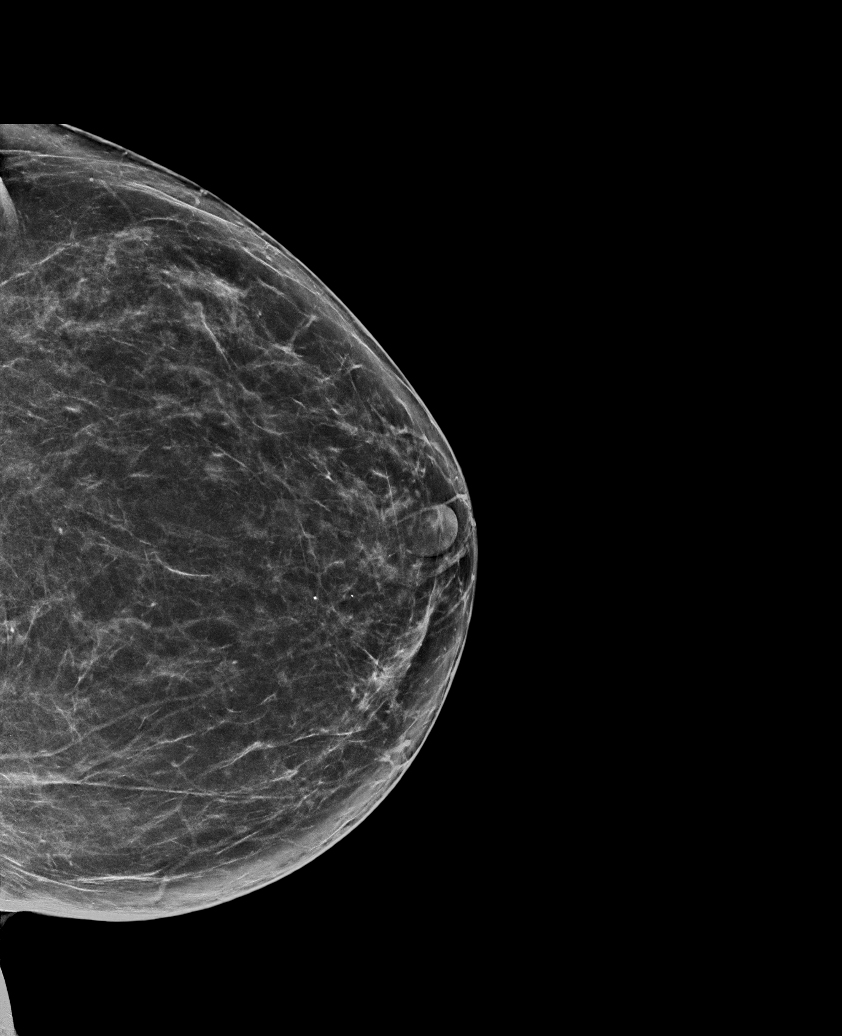

[R MLO tomo · tomo slice 39/77.0]
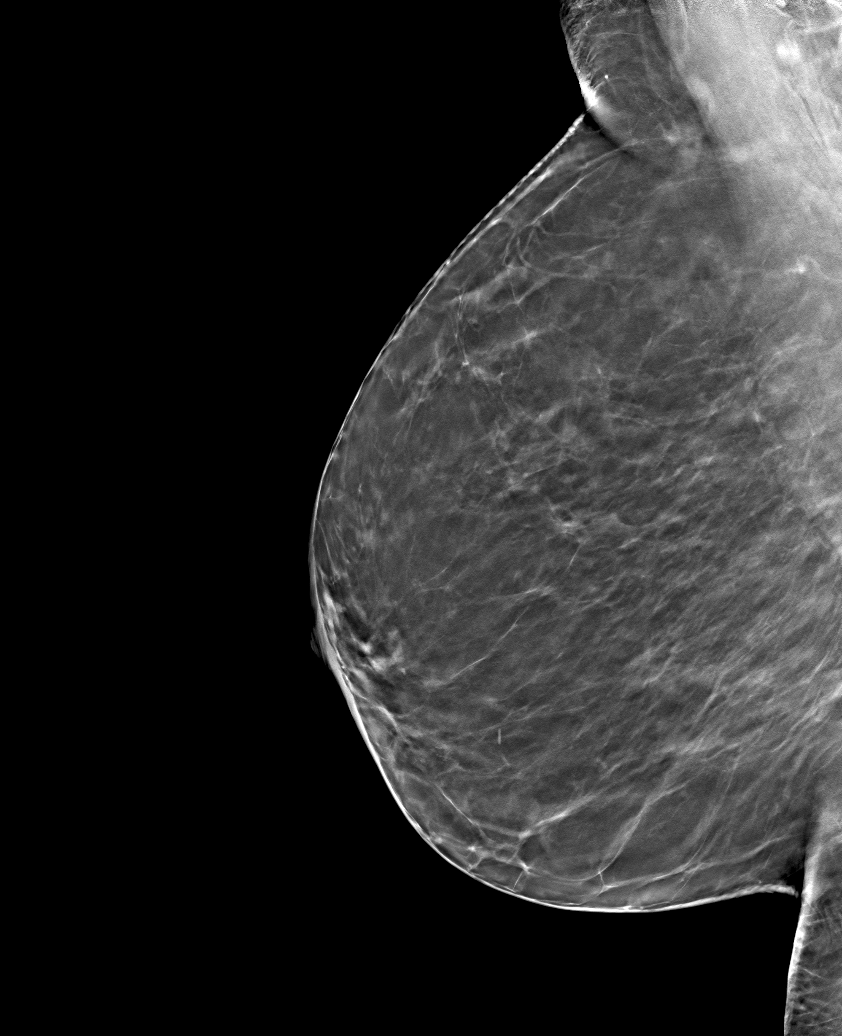

[6 of 30 positions shown; findings below may reference images not displayed]

ACR Breast Density Category b: There are scattered areas of
fibroglandular density.
FINDINGS: There are no findings suspicious for malignancy. Images were
processed with CAD.
IMPRESSION: No mammographic evidence of malignancy. A result letter of this
screening mammogram will be mailed directly to the patient.

RECOMMENDATION:
Screening mammogram in one year. (Code:[TQ])

BI-RADS CATEGORY  1: Negative.

## 2018-12-07 ENCOUNTER — Encounter: Payer: Self-pay | Admitting: Internal Medicine

## 2018-12-14 ENCOUNTER — Telehealth: Payer: Self-pay

## 2018-12-14 NOTE — Telephone Encounter (Signed)
Copied from [place]. Topic: Appointment Scheduling - Scheduling Inquiry for Clinic >> Dec 14, 2018  9:08 AM Scherrie Gerlach wrote: Reason for CRM: pt got her letter to reschedule dr scott appt.  Pt will like an appt on a Wed am.  Pt states she works by appts as well and she cannot keep resceduling her appts, as this keeps happening. Please call back to resc her appt.  thanks

## 2018-12-15 NOTE — Telephone Encounter (Signed)
The patient requested and 8:00 visit the soonest I had available was 6.3.20. I put the patient on the wait list incase something comes open.

## 2018-12-28 ENCOUNTER — Other Ambulatory Visit: Payer: Self-pay | Admitting: Internal Medicine

## 2019-01-06 DIAGNOSIS — D0439 Carcinoma in situ of skin of other parts of face: Secondary | ICD-10-CM | POA: Diagnosis not present

## 2019-01-06 DIAGNOSIS — L578 Other skin changes due to chronic exposure to nonionizing radiation: Secondary | ICD-10-CM | POA: Diagnosis not present

## 2019-01-06 DIAGNOSIS — D485 Neoplasm of uncertain behavior of skin: Secondary | ICD-10-CM | POA: Diagnosis not present

## 2019-01-09 ENCOUNTER — Ambulatory Visit: Payer: PPO | Admitting: Internal Medicine

## 2019-01-31 DIAGNOSIS — D0439 Carcinoma in situ of skin of other parts of face: Secondary | ICD-10-CM | POA: Diagnosis not present

## 2019-01-31 DIAGNOSIS — C44329 Squamous cell carcinoma of skin of other parts of face: Secondary | ICD-10-CM | POA: Diagnosis not present

## 2019-02-09 DIAGNOSIS — L905 Scar conditions and fibrosis of skin: Secondary | ICD-10-CM | POA: Diagnosis not present

## 2019-02-09 DIAGNOSIS — L570 Actinic keratosis: Secondary | ICD-10-CM | POA: Diagnosis not present

## 2019-03-26 ENCOUNTER — Other Ambulatory Visit: Payer: Self-pay | Admitting: Internal Medicine

## 2019-04-03 ENCOUNTER — Encounter

## 2019-04-04 ENCOUNTER — Telehealth: Payer: Self-pay | Admitting: Internal Medicine

## 2019-04-04 DIAGNOSIS — E119 Type 2 diabetes mellitus without complications: Secondary | ICD-10-CM

## 2019-04-04 DIAGNOSIS — E039 Hypothyroidism, unspecified: Secondary | ICD-10-CM

## 2019-04-04 DIAGNOSIS — E7800 Pure hypercholesterolemia, unspecified: Secondary | ICD-10-CM

## 2019-04-04 NOTE — Telephone Encounter (Signed)
Pt would like to have labs done and then schedule a follow up appt with Dr. Nicki Reaper. Pt has not had labs since Jan. Before having a follow up with Dr. Nicki Reaper she wants to do labs.

## 2019-04-05 ENCOUNTER — Ambulatory Visit: Payer: PPO | Admitting: Internal Medicine

## 2019-04-05 NOTE — Telephone Encounter (Signed)
I have placed order for routine fasting labs. Wasn't sure if she needed anything else added.

## 2019-04-05 NOTE — Telephone Encounter (Signed)
Labs ok.  Thank you. Please let [place] know.

## 2019-04-05 NOTE — Telephone Encounter (Signed)
Lab orders have been placed. Please schedule pt for fasting labs and then f/u with Dr. Nicki Reaper

## 2019-05-12 ENCOUNTER — Telehealth: Payer: Self-pay

## 2019-05-12 NOTE — Telephone Encounter (Signed)
Copied from [place] [phone]. Topic: General - Other >> May 12, 2019  9:27 AM Leward Quan A wrote: Reason for CRM: Patient called to request a printed Rx fro her estradiol-norethindrone [facility]) 0.05-0.25 MG/DAY when she come for blood work on 05/16/2019. Please advise

## 2019-05-12 NOTE — Telephone Encounter (Signed)
LMTCB

## 2019-05-12 NOTE — Telephone Encounter (Signed)
Are you okay with printing this rx for her? It has not been filled since 05/2018 per chart

## 2019-05-12 NOTE — Telephone Encounter (Signed)
Confirm with pt still wanting to remain on estrogen.  Can try tapering if decides ready to come off.  If wants to remain on, then ok to refill until cpe

## 2019-05-15 MED ORDER — COMBIPATCH 0.05-0.25 MG/DAY TD PTTW: 1.0000 | MEDICATED_PATCH | TRANSDERMAL | 0 refills | Status: DC

## 2019-05-15 NOTE — Telephone Encounter (Signed)
RX FOR 90 DAYS signed by dr Derrel Nip and in jessica's red folder.

## 2019-05-15 NOTE — Telephone Encounter (Signed)
Called patient to let her know that prescription was printed and signed by Dr. Derrel Nip. She will pick it up in the morning when she comes for labs.

## 2019-05-15 NOTE — Addendum Note (Signed)
Addended by: Crecencio Mc on: 05/15/2019 04:01 PM   Modules accepted: Orders

## 2019-05-15 NOTE — Telephone Encounter (Signed)
Pt returning call re: msg left on 05/12/2019.  Pt request call back on work number: [phone], pt also states ok to leave msg with her husband if she is unavailable.

## 2019-05-15 NOTE — Telephone Encounter (Signed)
This patient is coming in for labs tomorrow. I spoke with her and confirmed that she does want to stay on the estrogen patch. Dr Nicki Reaper will not be in the office to sign the rx before she runs out. Do you mind seeing if Dr. Derrel Nip will refill it and sign the prescription this afternoon so she can pick it up in the morning?

## 2019-05-16 ENCOUNTER — Other Ambulatory Visit: Payer: Self-pay

## 2019-05-16 ENCOUNTER — Other Ambulatory Visit (INDEPENDENT_AMBULATORY_CARE_PROVIDER_SITE_OTHER): Payer: PPO

## 2019-05-16 DIAGNOSIS — E119 Type 2 diabetes mellitus without complications: Secondary | ICD-10-CM

## 2019-05-16 DIAGNOSIS — E039 Hypothyroidism, unspecified: Secondary | ICD-10-CM | POA: Diagnosis not present

## 2019-05-16 DIAGNOSIS — E7800 Pure hypercholesterolemia, unspecified: Secondary | ICD-10-CM | POA: Diagnosis not present

## 2019-05-16 LAB — LIPID PANEL
Cholesterol: 224 mg/dL — ABNORMAL HIGH (ref 0–200)
HDL: 34.4 mg/dL — ABNORMAL LOW (ref >39.00)
NonHDL: 189.31
Total CHOL/HDL Ratio: 7
Triglycerides: 340 mg/dL — ABNORMAL HIGH (ref 0.0–149.0)
VLDL: 68 mg/dL — ABNORMAL HIGH (ref 0.0–40.0)

## 2019-05-16 LAB — HEPATIC FUNCTION PANEL
ALT: 11 U/L (ref 0–35)
AST: 12 U/L (ref 0–37)
Albumin: 4.3 g/dL (ref 3.5–5.2)
Alkaline Phosphatase: 74 U/L (ref 39–117)
Bilirubin, Direct: 0.1 mg/dL (ref 0.0–0.3)
Total Bilirubin: 0.7 mg/dL (ref 0.2–1.2)
Total Protein: 6.5 g/dL (ref 6.0–8.3)

## 2019-05-16 LAB — BASIC METABOLIC PANEL
BUN: 21 mg/dL (ref 6–23)
CO2: 29 mEq/L (ref 19–32)
Calcium: 9.3 mg/dL (ref 8.4–10.5)
Chloride: 104 mEq/L (ref 96–112)
Creatinine, Ser: 0.8 mg/dL (ref 0.40–1.20)
GFR: 69.71 mL/min (ref >60.00)
Glucose, Bld: 134 mg/dL — ABNORMAL HIGH (ref 70–99)
Potassium: 4.2 mEq/L (ref 3.5–5.1)
Sodium: 140 mEq/L (ref 135–145)

## 2019-05-16 LAB — LDL CHOLESTEROL, DIRECT: Direct LDL: 116 mg/dL

## 2019-05-16 LAB — HEMOGLOBIN A1C: Hgb A1c MFr Bld: 6.8 % — ABNORMAL HIGH (ref 4.6–6.5)

## 2019-05-16 LAB — TSH: TSH: 3.23 u[IU]/mL (ref 0.35–4.50)

## 2019-05-25 ENCOUNTER — Other Ambulatory Visit: Payer: Self-pay | Admitting: Internal Medicine

## 2019-06-12 DIAGNOSIS — L570 Actinic keratosis: Secondary | ICD-10-CM | POA: Diagnosis not present

## 2019-06-22 ENCOUNTER — Other Ambulatory Visit: Payer: Self-pay | Admitting: Internal Medicine

## 2019-06-23 NOTE — Telephone Encounter (Signed)
Pt has cancelled last two appts.  LOV: 09/26/18 NOV: 10/02/2019.

## 2019-06-23 NOTE — Telephone Encounter (Signed)
Okay to refill? 

## 2019-06-24 NOTE — Telephone Encounter (Signed)
I ok'd her refills.  Please schedule her for a f/u visit.  Ok if prefers doxy or telephone.  Have not seen since 09/2018

## 2019-06-29 ENCOUNTER — Telehealth: Payer: Self-pay | Admitting: Internal Medicine

## 2019-06-29 MED ORDER — COMBIPATCH 0.05-0.25 MG/DAY TD PTTW: 1.0000 | MEDICATED_PATCH | TRANSDERMAL | 0 refills | Status: DC

## 2019-06-29 NOTE — Telephone Encounter (Signed)
rx printed and signed

## 2019-06-29 NOTE — Telephone Encounter (Signed)
Called patient to let her know. Had to leave message with her staff to call me back

## 2019-06-29 NOTE — Telephone Encounter (Signed)
Are you okay with me filling this for her? She got a script in July but was not able to receive from [place]. Patient would like to pick up hard copy for one month supply to take to a local pharmacy.

## 2019-06-29 NOTE — Telephone Encounter (Signed)
Pt called and received information. Pt stated understanding and states she will send her husband.

## 2019-06-29 NOTE — Telephone Encounter (Signed)
Husband has picked up

## 2019-06-29 NOTE — Telephone Encounter (Signed)
Relation to pt: self  Call back number: [phone] work #    Reason for call:  Patient checking on the status of estradiol-norethindrone [facility]) 0.05-0.25 MG/DAY , patient states a 90 day supply was prescribed in July and Rx was sent to [place] and the manfuatered states the Rx is on back order and doesn't know when it will be in stock. Patient requesting a new rX she would like to pick up hard copy as soon as poosible stating she's been without for 1 month. Patient requesting a follow up call today, please advise

## 2019-07-12 ENCOUNTER — Other Ambulatory Visit: Payer: Self-pay

## 2019-07-12 ENCOUNTER — Ambulatory Visit (INDEPENDENT_AMBULATORY_CARE_PROVIDER_SITE_OTHER): Payer: PPO | Admitting: Internal Medicine

## 2019-07-12 DIAGNOSIS — K219 Gastro-esophageal reflux disease without esophagitis: Secondary | ICD-10-CM | POA: Diagnosis not present

## 2019-07-12 DIAGNOSIS — E7800 Pure hypercholesterolemia, unspecified: Secondary | ICD-10-CM | POA: Diagnosis not present

## 2019-07-12 DIAGNOSIS — E039 Hypothyroidism, unspecified: Secondary | ICD-10-CM

## 2019-07-12 DIAGNOSIS — Z23 Encounter for immunization: Secondary | ICD-10-CM | POA: Diagnosis not present

## 2019-07-12 DIAGNOSIS — I10 Essential (primary) hypertension: Secondary | ICD-10-CM

## 2019-07-12 DIAGNOSIS — E119 Type 2 diabetes mellitus without complications: Secondary | ICD-10-CM

## 2019-07-12 DIAGNOSIS — Z8371 Family history of colonic polyps: Secondary | ICD-10-CM

## 2019-07-12 LAB — HM DIABETES FOOT EXAM

## 2019-07-12 NOTE — Progress Notes (Signed)
Patient ID: Mariah Moody, female   DOB: [birth year], 78 y.o.   MRN: [id number]   Subjective:    Patient ID: Mariah Moody, female    DOB: 12/02/1940, 78 y.o.   MRN: [id number]  HPI  Patient here for a scheduled follow up.  She reports she is doing well.  Working.  Stays active. No chest pain. No sob. No acid reflux.  No abdominal pain.  Bowels moving.  a1c last check 6.8.  She watches her diet.  Discussed low carb diet and exercise.  States blood pressure is doing well.     Past Medical History:  Diagnosis Date  . Anemia   . Coronary atherosclerosis of native coronary vessel   . Diabetes mellitus without complication ([place])   . Fibroid   . GERD (gastroesophageal reflux disease)   . History of chicken pox   . Hyperlipidemia   . Hypertension   . Hypothyroidism    Past Surgical History:  Procedure Laterality Date  . COLONOSCOPY N/A 04/15/2015   Procedure: COLONOSCOPY;  Surgeon: Hulen Luster, MD;  Location: [facility] ENDOSCOPY;  Service: Gastroenterology;  Laterality: N/A;  . ESOPHAGOGASTRODUODENOSCOPY N/A 04/15/2015   Procedure: ESOPHAGOGASTRODUODENOSCOPY (EGD);  Surgeon: Hulen Luster, MD;  Location: [facility] ENDOSCOPY;  Service: Gastroenterology;  Laterality: N/A;  . TUBAL LIGATION     Family History  Problem Relation Age of Onset  . Arthritis Mother   . Hypertension Mother   . Colon polyps Mother   . Heart disease Father   . Hypertension Father   . Diabetes Brother   . Cancer Neg Hx   . Breast cancer Neg Hx    Social History   Socioeconomic History  . Marital status: Married    Spouse name: Not on file  . Number of children: Not on file  . Years of education: Not on file  . Highest education level: Not on file  Occupational History  . Not on file  Social Needs  . Financial resource strain: Not on file  . Food insecurity    Worry: Not on file    Inability: Not on file  . Transportation needs    Medical: Not on file    Non-medical: Not on file  Tobacco Use  . Smoking  status: Former Research scientist (life sciences)  . Smokeless tobacco: Never Used  Substance and Sexual Activity  . Alcohol use: Yes    Alcohol/week: 0.0 standard drinks    Comment: occas  . Drug use: No  . Sexual activity: Not Currently  Lifestyle  . Physical activity    Days per week: Not on file    Minutes per session: Not on file  . Stress: Not on file  Relationships  . Social Herbalist on phone: Not on file    Gets together: Not on file    Attends religious service: Not on file    Active member of club or organization: Not on file    Attends meetings of clubs or organizations: Not on file    Relationship status: Not on file  Other Topics Concern  . Not on file  Social History Narrative  . Not on file    Outpatient Encounter Medications as of 07/12/2019  Medication Sig  . Calcium Carbonate-Vitamin D (CALCIUM + D PO) Take 1 tablet by mouth daily.  . Cholecalciferol (VITAMIN D PO) Take 1 capsule by mouth daily.   Marland Kitchen estradiol-norethindrone (COMBIPATCH) 0.05-0.25 MG/DAY Place 1 patch onto the skin 2 (two) times a week.  Marland Kitchen  hydrochlorothiazide (HYDRODIURIL) 25 MG tablet TAKE 1 TABLET BY MOUTH EVERY DAY  . levothyroxine (SYNTHROID) 88 MCG tablet TAKE 1 TABLET (88 MCG TOTAL) DAILY BY MOUTH.  Marland Kitchen levothyroxine (SYNTHROID, LEVOTHROID) 88 MCG tablet Take 1 tablet (88 mcg total) by mouth daily before breakfast.  . Multiple Vitamins-Minerals (MULTIVITAMIN PO) Take 1 tablet by mouth daily.   Marland Kitchen olmesartan (BENICAR) 40 MG tablet TAKE 1 TABLET BY MOUTH EVERY DAY  . pantoprazole (PROTONIX) 40 MG tablet TAKE 1 TABLET BY MOUTH EVERY DAY   No facility-administered encounter medications on file as of 07/12/2019.     Review of Systems  Constitutional: Negative for appetite change and unexpected weight change.  HENT: Negative for congestion and sinus pressure.   Respiratory: Negative for cough, chest tightness and shortness of breath.   Cardiovascular: Negative for chest pain, palpitations and leg swelling.   Gastrointestinal: Negative for abdominal pain, diarrhea, nausea and vomiting.  Genitourinary: Negative for difficulty urinating and dysuria.  Musculoskeletal: Negative for joint swelling and myalgias.  Skin: Negative for color change and rash.  Neurological: Negative for dizziness, light-headedness and headaches.  Psychiatric/Behavioral: Negative for agitation and dysphoric mood.       Objective:    Physical Exam Constitutional:      General: She is not in acute distress.    Appearance: Normal appearance.  HENT:     Right Ear: External ear normal.     Left Ear: External ear normal.  Eyes:     General: No scleral icterus.       Right eye: No discharge.        Left eye: No discharge.     Conjunctiva/sclera: Conjunctivae normal.  Neck:     Musculoskeletal: Neck supple. No muscular tenderness.     Thyroid: No thyromegaly.  Cardiovascular:     Rate and Rhythm: Normal rate and regular rhythm.  Pulmonary:     Effort: No respiratory distress.     Breath sounds: Normal breath sounds. No wheezing.  Abdominal:     General: Bowel sounds are normal.     Palpations: Abdomen is soft.     Tenderness: There is no abdominal tenderness.  Musculoskeletal:        General: No swelling or tenderness.  Lymphadenopathy:     Cervical: No cervical adenopathy.  Skin:    Findings: No erythema or rash.  Neurological:     Mental Status: She is alert.  Psychiatric:        Mood and Affect: Mood normal.        Behavior: Behavior normal.     BP (!) 156/82   Pulse 72   Temp 97.6 F (36.4 C) (Temporal)   Resp 16   Ht '5\' 2"'  (1.575 m)   Wt 141 lb (64 kg)   SpO2 97%   BMI 25.79 kg/m  Wt Readings from Last 3 Encounters:  07/12/19 141 lb (64 kg)  09/26/18 141 lb 9.6 oz (64.2 kg)  12/29/17 140 lb (63.5 kg)     Lab Results  Component Value Date   WBC 5.2 11/28/2018   HGB 13.5 11/28/2018   HCT 39.6 11/28/2018   PLT 255.0 11/28/2018   GLUCOSE 134 (H) 05/16/2019   CHOL 224 (H) 05/16/2019    TRIG 340.0 (H) 05/16/2019   HDL 34.40 (L) 05/16/2019   LDLDIRECT 116.0 05/16/2019   LDLCALC 135 (H) 02/25/2016   ALT 11 05/16/2019   AST 12 05/16/2019   NA 140 05/16/2019   K 4.2 05/16/2019  CL 104 05/16/2019   CREATININE 0.80 05/16/2019   BUN 21 05/16/2019   CO2 29 05/16/2019   TSH 3.23 05/16/2019   HGBA1C 6.8 (H) 05/16/2019   MICROALBUR <0.7 07/11/2018    Mm 3d Screen Breast Bilateral  Result Date: 12/05/2018 CLINICAL DATA:  Screening. EXAM: DIGITAL SCREENING BILATERAL MAMMOGRAM WITH TOMO AND CAD COMPARISON:  Previous exam(s). ACR Breast Density Category b: There are scattered areas of fibroglandular density. FINDINGS: There are no findings suspicious for malignancy. Images were processed with CAD. IMPRESSION: No mammographic evidence of malignancy. A result letter of this screening mammogram will be mailed directly to the patient. RECOMMENDATION: Screening mammogram in one year. (Code:SM-B-01Y) BI-RADS CATEGORY  1: Negative. Electronically Signed   By: Ammie Ferrier M.D.   On: 12/05/2018 08:41       Assessment & Plan:   Problem List Items Addressed This Visit    Diabetes mellitus type II, controlled, with no complications ([place])    Discussed diet and exercise.  Last a1c 6.8.  Follow met b and a1c.        Relevant Orders   Hemoglobin A1c   Microalbumin / creatinine urine ratio   Essential hypertension, benign    Blood pressure on recheck improved.  Still slightly increased.  Have her spot check her pressure.  Send in readings.  Follow pressure.  Follow metabolic panel.        Relevant Orders   Basic metabolic panel   Family history of colonic polyps    Colonoscopy 04/2015 - normal.  States was told did not need f/u colonoscopy.        GERD (gastroesophageal reflux disease)    Controlled on current regimen.        Hypercholesterolemia    Low cholesterol diet and exercise.  Have discussed calculated cholesterol risk.  She declines.  Follow lipid panel.         Relevant Orders   Hepatic function panel   Lipid panel   Hypothyroidism    On thyroid replacement.  Follow tsh.         Other Visit Diagnoses    Need for immunization against influenza       Relevant Orders   Flu Vaccine QUAD High Dose(Fluad) (Completed)       Einar Pheasant, MD

## 2019-07-16 ENCOUNTER — Encounter: Payer: Self-pay | Admitting: Internal Medicine

## 2019-07-16 NOTE — Assessment & Plan Note (Signed)
Low cholesterol diet and exercise.  Have discussed calculated cholesterol risk.  She declines.  Follow lipid panel.

## 2019-07-16 NOTE — Assessment & Plan Note (Signed)
On thyroid replacement.  Follow tsh.  

## 2019-07-16 NOTE — Assessment & Plan Note (Signed)
Controlled on current regimen.   

## 2019-07-16 NOTE — Assessment & Plan Note (Signed)
Discussed diet and exercise.  Last a1c 6.8.  Follow met b and a1c.

## 2019-07-16 NOTE — Assessment & Plan Note (Signed)
Blood pressure on recheck improved.  Still slightly increased.  Have her spot check her pressure.  Send in readings.  Follow pressure.  Follow metabolic panel.

## 2019-07-16 NOTE — Assessment & Plan Note (Signed)
Colonoscopy 04/2015 - normal.  States was told did not need f/u colonoscopy.

## 2019-08-28 DIAGNOSIS — M9901 Segmental and somatic dysfunction of cervical region: Secondary | ICD-10-CM | POA: Diagnosis not present

## 2019-08-28 DIAGNOSIS — M531 Cervicobrachial syndrome: Secondary | ICD-10-CM | POA: Diagnosis not present

## 2019-08-28 DIAGNOSIS — M9903 Segmental and somatic dysfunction of lumbar region: Secondary | ICD-10-CM | POA: Diagnosis not present

## 2019-08-28 DIAGNOSIS — M5386 Other specified dorsopathies, lumbar region: Secondary | ICD-10-CM | POA: Diagnosis not present

## 2019-09-05 DIAGNOSIS — M9901 Segmental and somatic dysfunction of cervical region: Secondary | ICD-10-CM | POA: Diagnosis not present

## 2019-09-05 DIAGNOSIS — M5386 Other specified dorsopathies, lumbar region: Secondary | ICD-10-CM | POA: Diagnosis not present

## 2019-09-05 DIAGNOSIS — M9903 Segmental and somatic dysfunction of lumbar region: Secondary | ICD-10-CM | POA: Diagnosis not present

## 2019-09-05 DIAGNOSIS — M531 Cervicobrachial syndrome: Secondary | ICD-10-CM | POA: Diagnosis not present

## 2019-09-20 DIAGNOSIS — M9903 Segmental and somatic dysfunction of lumbar region: Secondary | ICD-10-CM | POA: Diagnosis not present

## 2019-09-20 DIAGNOSIS — M531 Cervicobrachial syndrome: Secondary | ICD-10-CM | POA: Diagnosis not present

## 2019-09-20 DIAGNOSIS — M5386 Other specified dorsopathies, lumbar region: Secondary | ICD-10-CM | POA: Diagnosis not present

## 2019-09-20 DIAGNOSIS — M9901 Segmental and somatic dysfunction of cervical region: Secondary | ICD-10-CM | POA: Diagnosis not present

## 2019-10-01 ENCOUNTER — Encounter: Payer: Self-pay | Admitting: Internal Medicine

## 2019-10-02 ENCOUNTER — Other Ambulatory Visit: Payer: Self-pay

## 2019-10-02 ENCOUNTER — Ambulatory Visit (INDEPENDENT_AMBULATORY_CARE_PROVIDER_SITE_OTHER): Payer: PPO | Admitting: Internal Medicine

## 2019-10-02 ENCOUNTER — Encounter: Payer: Self-pay | Admitting: Internal Medicine

## 2019-10-02 VITALS — BP 116/72 | HR 72 | Temp 97.3°F | Ht 62.0 in | Wt 141.6 lb

## 2019-10-02 DIAGNOSIS — I10 Essential (primary) hypertension: Secondary | ICD-10-CM | POA: Diagnosis not present

## 2019-10-02 DIAGNOSIS — K219 Gastro-esophageal reflux disease without esophagitis: Secondary | ICD-10-CM

## 2019-10-02 DIAGNOSIS — E119 Type 2 diabetes mellitus without complications: Secondary | ICD-10-CM | POA: Diagnosis not present

## 2019-10-02 DIAGNOSIS — E039 Hypothyroidism, unspecified: Secondary | ICD-10-CM

## 2019-10-02 DIAGNOSIS — E7800 Pure hypercholesterolemia, unspecified: Secondary | ICD-10-CM | POA: Diagnosis not present

## 2019-10-02 DIAGNOSIS — Z8371 Family history of colonic polyps: Secondary | ICD-10-CM | POA: Diagnosis not present

## 2019-10-02 DIAGNOSIS — Z0000 Encounter for general adult medical examination without abnormal findings: Secondary | ICD-10-CM | POA: Diagnosis not present

## 2019-10-02 DIAGNOSIS — Z83719 Family history of colon polyps, unspecified: Secondary | ICD-10-CM

## 2019-10-02 LAB — HEPATIC FUNCTION PANEL
ALT: 14 U/L (ref 0–35)
AST: 15 U/L (ref 0–37)
Albumin: 4 g/dL (ref 3.5–5.2)
Alkaline Phosphatase: 69 U/L (ref 39–117)
Bilirubin, Direct: 0.1 mg/dL (ref 0.0–0.3)
Total Bilirubin: 0.8 mg/dL (ref 0.2–1.2)
Total Protein: 6.8 g/dL (ref 6.0–8.3)

## 2019-10-02 LAB — LDL CHOLESTEROL, DIRECT: Direct LDL: 109 mg/dL

## 2019-10-02 LAB — MICROALBUMIN / CREATININE URINE RATIO
Creatinine,U: 68.4 mg/dL
Microalb Creat Ratio: 1.1 mg/g (ref 0.0–30.0)
Microalb, Ur: 0.8 mg/dL (ref 0.0–1.9)

## 2019-10-02 LAB — LIPID PANEL
Cholesterol: 258 mg/dL — ABNORMAL HIGH (ref 0–200)
HDL: 33.3 mg/dL — ABNORMAL LOW (ref >39.00)
NonHDL: 224.93
Total CHOL/HDL Ratio: 8
Triglycerides: 368 mg/dL — ABNORMAL HIGH (ref 0.0–149.0)
VLDL: 73.6 mg/dL — ABNORMAL HIGH (ref 0.0–40.0)

## 2019-10-02 LAB — BASIC METABOLIC PANEL
BUN: 21 mg/dL (ref 6–23)
CO2: 29 mEq/L (ref 19–32)
Calcium: 9.9 mg/dL (ref 8.4–10.5)
Chloride: 101 mEq/L (ref 96–112)
Creatinine, Ser: 0.92 mg/dL (ref 0.40–1.20)
GFR: 59.27 mL/min — ABNORMAL LOW (ref >60.00)
Glucose, Bld: 147 mg/dL — ABNORMAL HIGH (ref 70–99)
Potassium: 4.4 mEq/L (ref 3.5–5.1)
Sodium: 139 mEq/L (ref 135–145)

## 2019-10-02 LAB — HEMOGLOBIN A1C: Hgb A1c MFr Bld: 6.8 % — ABNORMAL HIGH (ref 4.6–6.5)

## 2019-10-02 MED ORDER — MELOXICAM 7.5 MG PO TABS: 7.5000 mg | ORAL_TABLET | Freq: Every day | ORAL | 0 refills | Status: DC

## 2019-10-02 NOTE — Progress Notes (Signed)
Patient ID: Mariah Moody, female   DOB: [birth year], 78 y.o.   MRN: [id number]   Subjective:    Patient ID: Mariah Moody, female    DOB: 1941/10/16, 78 y.o.   MRN: [id number]  HPI  Patient here for her physical exam.  She reports she is doing relatively well.  Working. Tries to stay active.  No chest pain.  No sob.  No acid reflux.  No abdominal pain.  Bowels moving.  Handling stress.  Colonoscopy 04/2015 - normal.  Recommended no follow up colonoscopy.  Declines cholesterol medication.    Past Medical History:  Diagnosis Date  . Anemia   . Coronary atherosclerosis of native coronary vessel   . Diabetes mellitus without complication ([place])   . Fibroid   . GERD (gastroesophageal reflux disease)   . History of chicken pox   . Hyperlipidemia   . Hypertension   . Hypothyroidism    Past Surgical History:  Procedure Laterality Date  . COLONOSCOPY N/A 04/15/2015   Procedure: COLONOSCOPY;  Surgeon: Hulen Luster, MD;  Location: [facility] ENDOSCOPY;  Service: Gastroenterology;  Laterality: N/A;  . ESOPHAGOGASTRODUODENOSCOPY N/A 04/15/2015   Procedure: ESOPHAGOGASTRODUODENOSCOPY (EGD);  Surgeon: Hulen Luster, MD;  Location: [facility] ENDOSCOPY;  Service: Gastroenterology;  Laterality: N/A;  . TUBAL LIGATION     Family History  Problem Relation Age of Onset  . Arthritis Mother   . Hypertension Mother   . Colon polyps Mother   . Heart disease Father   . Hypertension Father   . Diabetes Brother   . Cancer Neg Hx   . Breast cancer Neg Hx    Social History   Socioeconomic History  . Marital status: Married    Spouse name: Not on file  . Number of children: Not on file  . Years of education: Not on file  . Highest education level: Not on file  Occupational History  . Not on file  Social Needs  . Financial resource strain: Not on file  . Food insecurity    Worry: Not on file    Inability: Not on file  . Transportation needs    Medical: Not on file    Non-medical: Not on file  Tobacco  Use  . Smoking status: Former Research scientist (life sciences)  . Smokeless tobacco: Never Used  Substance and Sexual Activity  . Alcohol use: Yes    Alcohol/week: 0.0 standard drinks    Comment: occas  . Drug use: No  . Sexual activity: Not Currently  Lifestyle  . Physical activity    Days per week: Not on file    Minutes per session: Not on file  . Stress: Not on file  Relationships  . Social Herbalist on phone: Not on file    Gets together: Not on file    Attends religious service: Not on file    Active member of club or organization: Not on file    Attends meetings of clubs or organizations: Not on file    Relationship status: Not on file  Other Topics Concern  . Not on file  Social History Narrative  . Not on file    Outpatient Encounter Medications as of 10/02/2019  Medication Sig  . Calcium Carbonate-Vitamin D (CALCIUM + D PO) Take 1 tablet by mouth daily.  . Cholecalciferol (VITAMIN D PO) Take 1 capsule by mouth daily.   Marland Kitchen estradiol-norethindrone (COMBIPATCH) 0.05-0.25 MG/DAY Place 1 patch onto the skin 2 (two) times a week.  . hydrochlorothiazide (  HYDRODIURIL) 25 MG tablet TAKE 1 TABLET BY MOUTH EVERY DAY  . levothyroxine (SYNTHROID, LEVOTHROID) 88 MCG tablet Take 1 tablet (88 mcg total) by mouth daily before breakfast.  . Multiple Vitamins-Minerals (MULTIVITAMIN PO) Take 1 tablet by mouth daily.   Marland Kitchen olmesartan (BENICAR) 40 MG tablet TAKE 1 TABLET BY MOUTH EVERY DAY  . pantoprazole (PROTONIX) 40 MG tablet TAKE 1 TABLET BY MOUTH EVERY DAY  . meloxicam (MOBIC) 7.5 MG tablet Take 1 tablet (7.5 mg total) by mouth daily.  . [DISCONTINUED] levothyroxine (SYNTHROID) 88 MCG tablet TAKE 1 TABLET (88 MCG TOTAL) DAILY BY MOUTH.   No facility-administered encounter medications on file as of 10/02/2019.    Review of Systems  Constitutional: Negative for appetite change and unexpected weight change.  HENT: Negative for congestion and sinus pressure.   Eyes: Negative for pain and visual  disturbance.  Respiratory: Negative for cough, chest tightness and shortness of breath.   Cardiovascular: Negative for chest pain, palpitations and leg swelling.  Gastrointestinal: Negative for abdominal pain, diarrhea, nausea and vomiting.  Genitourinary: Negative for difficulty urinating and dysuria.  Musculoskeletal: Negative for joint swelling and myalgias.  Skin: Negative for color change and rash.  Neurological: Negative for dizziness, light-headedness and headaches.  Hematological: Negative for adenopathy. Does not bruise/bleed easily.  Psychiatric/Behavioral: Negative for agitation and dysphoric mood.       Objective:    Physical Exam Constitutional:      General: She is not in acute distress.    Appearance: Normal appearance. She is well-developed.  HENT:     Head: Normocephalic and atraumatic.  Eyes:     General: No scleral icterus.       Right eye: No discharge.        Left eye: No discharge.     Conjunctiva/sclera: Conjunctivae normal.  Neck:     Musculoskeletal: Neck supple. No muscular tenderness.     Thyroid: No thyromegaly.  Cardiovascular:     Rate and Rhythm: Normal rate and regular rhythm.  Pulmonary:     Effort: No tachypnea, accessory muscle usage or respiratory distress.     Breath sounds: Normal breath sounds. No decreased breath sounds or wheezing.  Chest:     Breasts:        Right: No inverted nipple, mass, nipple discharge or tenderness (no axillary adenopathy).        Left: No inverted nipple, mass, nipple discharge or tenderness (no axilarry adenopathy).  Abdominal:     General: Bowel sounds are normal.     Palpations: Abdomen is soft.     Tenderness: There is no abdominal tenderness.  Musculoskeletal:        General: No swelling or tenderness.  Lymphadenopathy:     Cervical: No cervical adenopathy.  Skin:    Findings: No erythema or rash.  Neurological:     Mental Status: She is alert and oriented to person, place, and time.  Psychiatric:         Mood and Affect: Mood normal.        Behavior: Behavior normal.     BP 116/72   Pulse 72   Temp (!) 97.3 F (36.3 C) (Skin)   Ht _0  (1.575 m)   Wt 141 lb 9.6 oz (64.2 kg)   SpO2 93%   BMI 25.90 kg/m  Wt Readings from Last 3 Encounters:  10/02/19 141 lb 9.6 oz (64.2 kg)  07/12/19 141 lb (64 kg)  09/26/18 141 lb 9.6 oz (64.2 kg)  Lab Results  Component Value Date   WBC 5.2 11/28/2018   HGB 13.5 11/28/2018   HCT 39.6 11/28/2018   PLT 255.0 11/28/2018   GLUCOSE 147 (H) 10/02/2019   CHOL 258 (H) 10/02/2019   TRIG 368.0 (H) 10/02/2019   HDL 33.30 (L) 10/02/2019   LDLDIRECT 109.0 10/02/2019   LDLCALC 135 (H) 02/25/2016   ALT 14 10/02/2019   AST 15 10/02/2019   NA 139 10/02/2019   K 4.4 10/02/2019   CL 101 10/02/2019   CREATININE 0.92 10/02/2019   BUN 21 10/02/2019   CO2 29 10/02/2019   TSH 3.23 05/16/2019   HGBA1C 6.8 (H) 10/02/2019   MICROALBUR 0.8 10/02/2019    Mm 3d Screen Breast Bilateral  Result Date: 12/05/2018 CLINICAL DATA:  Screening. EXAM: DIGITAL SCREENING BILATERAL MAMMOGRAM WITH TOMO AND CAD COMPARISON:  Previous exam(s). ACR Breast Density Category b: There are scattered areas of fibroglandular density. FINDINGS: There are no findings suspicious for malignancy. Images were processed with CAD. IMPRESSION: No mammographic evidence of malignancy. A result letter of this screening mammogram will be mailed directly to the patient. RECOMMENDATION: Screening mammogram in one year. (Code:SM-B-01Y) BI-RADS CATEGORY  1: Negative. Electronically Signed   By: Ammie Ferrier M.D.   On: 12/05/2018 08:41       Assessment & Plan:   Problem List Items Addressed This Visit    Diabetes mellitus type II, controlled, with no complications ([place])    Discussed diet and exercise.  Follow met b and a1c.       Essential hypertension, benign    Blood pressure under good control.  Continue same medication regimen.  Follow pressures.  Follow metabolic panel.         Family history of colonic polyps    Colonoscopy 04/2015 - normal.  States does not need f/u colonoscopy.        GERD (gastroesophageal reflux disease)    Upper symptoms controlled on omeprazole.        Health care maintenance    Physical today 10/02/19.  Mammogram 12/05/18 - birads I.  Colonoscopy 2016.  States doe snot need f/u colonoscopy.        Hypercholesterolemia    Low cholesterol diet and exercise.  Have discussed and again discussed with her today regarding calculated cholesterol risk.  Discussed recommendation to start cholesterol medication.  She declines.  Follow lipid panel.        Hypothyroidism    On thyroid replacement.  Follow tsh.           Einar Pheasant, MD

## 2019-10-04 ENCOUNTER — Encounter: Payer: Self-pay | Admitting: Internal Medicine

## 2019-10-07 ENCOUNTER — Encounter: Payer: Self-pay | Admitting: Internal Medicine

## 2019-10-07 NOTE — Assessment & Plan Note (Signed)
On thyroid replacement.  Follow tsh.  

## 2019-10-07 NOTE — Assessment & Plan Note (Signed)
Low cholesterol diet and exercise.  Have discussed and again discussed with her today regarding calculated cholesterol risk.  Discussed recommendation to start cholesterol medication.  She declines.  Follow lipid panel.

## 2019-10-07 NOTE — Assessment & Plan Note (Signed)
Physical today 10/02/19.  Mammogram 12/05/18 - birads I.  Colonoscopy 2016.  States doe snot need f/u colonoscopy.

## 2019-10-07 NOTE — Assessment & Plan Note (Signed)
Blood pressure under good control.  Continue same medication regimen.  Follow pressures.  Follow metabolic panel.   

## 2019-10-07 NOTE — Assessment & Plan Note (Signed)
Discussed diet and exercise.  Follow met b and a1c.  

## 2019-10-07 NOTE — Assessment & Plan Note (Signed)
Colonoscopy 04/2015 - normal.  States does not need f/u colonoscopy.

## 2019-10-07 NOTE — Assessment & Plan Note (Signed)
Upper symptoms controlled on omeprazole.  

## 2019-10-10 ENCOUNTER — Telehealth: Payer: Self-pay | Admitting: *Deleted

## 2019-10-10 NOTE — Telephone Encounter (Signed)
With symptoms, needs appt - to determine best treatment.  I can work in Architectural technologist.

## 2019-10-10 NOTE — Telephone Encounter (Signed)
Copied from [place] [phone]. Topic: General - Inquiry >> Oct 10, 2019  8:47 AM Richardo Priest, NT wrote: Reason for CRM: Pt called in stating she is still having a sinus infection and would like a script sent in to pharmacy at [id number] - [place], [place] [phone] (Phone) [phone] (Fax). Pt is experiencing more drainage and mucus is now green. Pt also wanted to update PCP about  meloxicam (MOBIC) 7.5 MG tablet. Pt states her back is much better. Please advise.

## 2019-10-10 NOTE — Telephone Encounter (Signed)
Pt scheduled  

## 2019-10-10 NOTE — Telephone Encounter (Signed)
Patient feels this is a sinus infection , stuffy and has green colored mucus and cough ,denies SOB and fever says the symptoms are all in her head. Symptoms started on 10/05/19.  Patient says she has not been exposed to [place].  Patient says he can smell and taste , just all in her head. Patient requesting ABX and cough medication.

## 2019-10-11 ENCOUNTER — Ambulatory Visit (INDEPENDENT_AMBULATORY_CARE_PROVIDER_SITE_OTHER): Payer: PPO | Admitting: Internal Medicine

## 2019-10-11 ENCOUNTER — Encounter: Payer: Self-pay | Admitting: Internal Medicine

## 2019-10-11 ENCOUNTER — Other Ambulatory Visit: Payer: Self-pay

## 2019-10-11 DIAGNOSIS — J329 Chronic sinusitis, unspecified: Secondary | ICD-10-CM | POA: Insufficient documentation

## 2019-10-11 MED ORDER — AMOXICILLIN 875 MG PO TABS: 875.0000 mg | ORAL_TABLET | Freq: Two times a day (BID) | ORAL | 0 refills | Status: DC

## 2019-10-11 NOTE — Assessment & Plan Note (Signed)
Increased sinus pressure and nasal congestion as outlined.  No sob, chest pain or tightness.  States feels similar to her previous sinus infections.  Discussed recommendation for covid testing and self quarantine.  Given directions.  Continue nasacort nasal spray and saline nasal spray.  Mucinex/robitussin DM as directed.  With progression of symptoms despite otc medication, will treat with amoxicillin.  Probiotic as directed.  Follow closely.  Update me if symptoms change or progress.

## 2019-10-11 NOTE — Progress Notes (Signed)
Patient ID: Mariah Moody, female   DOB: 02-02-1941, 78 y.o.   MRN: [id number]   Virtual Visit via telephone Note  This visit type was conducted due to national recommendations for restrictions regarding the COVID-19 pandemic (e.g. social distancing).  This format is felt to be most appropriate for this patient at this time.  All issues noted in this document were discussed and addressed.  No physical exam was performed (except for noted visual exam findings with Video Visits).   I connected with [name] by telephone and verified that I am speaking with the correct person using two identifiers. Location patient: work Location provider: work  Persons participating in the virtual visit: patient, provider  I discussed the limitations, risks, security and privacy concerns of performing an evaluation and management service by telephone and the availability of in person appointments.  The patient expressed understanding and agreed to proceed.   Reason for visit: work in appt.   HPI: Work in appt for sinus symptoms.  States she "gets this every year".  Symptoms started 6 days ago.  Increased sinus pressure and nasal congestion.  No fever.  Minimal ear pressure.  No significant ear ache.  Increased drainage.  Previous sore throat, but this has resolved.  Some cough.  Denies chest congestion or sob.  No chest pain or tightness.  No nausea or vomiting.  No diarrhea.  Using saline nasal spray and nasacort nasal spray.  Taking dayquil and nyquil.  Reports symptoms progressing.  Increased green mucus production now.  States feels like her typical sinus infections.  Denies exposure to anyone with known covid.  Discussed need to be tested and self quarantine.     ROS: See pertinent positives and negatives per HPI.  Past Medical History:  Diagnosis Date  . Anemia   . Coronary atherosclerosis of native coronary vessel   . Diabetes mellitus without complication ([place])   . Fibroid   . GERD  (gastroesophageal reflux disease)   . History of chicken pox   . Hyperlipidemia   . Hypertension   . Hypothyroidism     Past Surgical History:  Procedure Laterality Date  . COLONOSCOPY N/A 04/15/2015   Procedure: COLONOSCOPY;  Surgeon: Hulen Luster, MD;  Location: [facility] ENDOSCOPY;  Service: Gastroenterology;  Laterality: N/A;  . ESOPHAGOGASTRODUODENOSCOPY N/A 04/15/2015   Procedure: ESOPHAGOGASTRODUODENOSCOPY (EGD);  Surgeon: Hulen Luster, MD;  Location: [facility] ENDOSCOPY;  Service: Gastroenterology;  Laterality: N/A;  . TUBAL LIGATION      Family History  Problem Relation Age of Onset  . Arthritis Mother   . Hypertension Mother   . Colon polyps Mother   . Heart disease Father   . Hypertension Father   . Diabetes Brother   . Cancer Neg Hx   . Breast cancer Neg Hx     SOCIAL HX: reviewed.    Current Outpatient Medications:  .  Calcium Carbonate-Vitamin D (CALCIUM + D PO), Take 1 tablet by mouth daily., Disp: , Rfl:  .  Cholecalciferol (VITAMIN D PO), Take 1 capsule by mouth daily. , Disp: , Rfl:  .  estradiol-norethindrone (COMBIPATCH) 0.05-0.25 MG/DAY, Place 1 patch onto the skin 2 (two) times a week., Disp: 24 patch, Rfl: 0 .  hydrochlorothiazide (HYDRODIURIL) 25 MG tablet, TAKE 1 TABLET BY MOUTH EVERY DAY, Disp: 90 tablet, Rfl: 1 .  levothyroxine (SYNTHROID, LEVOTHROID) 88 MCG tablet, Take 1 tablet (88 mcg total) by mouth daily before breakfast., Disp: 90 tablet, Rfl: 0 .  meloxicam (MOBIC) 7.5  MG tablet, Take 1 tablet (7.5 mg total) by mouth daily., Disp: 30 tablet, Rfl: 0 .  Multiple Vitamins-Minerals (MULTIVITAMIN PO), Take 1 tablet by mouth daily. , Disp: , Rfl:  .  olmesartan (BENICAR) 40 MG tablet, TAKE 1 TABLET BY MOUTH EVERY DAY, Disp: 90 tablet, Rfl: 0 .  pantoprazole (PROTONIX) 40 MG tablet, TAKE 1 TABLET BY MOUTH EVERY DAY, Disp: 90 tablet, Rfl: 3 .  amoxicillin (AMOXIL) 875 MG tablet, Take 1 tablet (875 mg total) by mouth 2 (two) times daily., Disp: 20 tablet, Rfl: 0   EXAM:  GENERAL: alert.  Sounds to be in no acute distress.  Answering questions appropriately.   PSYCH/NEURO: pleasant and cooperative, no obvious depression or anxiety, speech and thought processing grossly intact  ASSESSMENT AND PLAN:  Discussed the following assessment and plan:  Sinusitis Increased sinus pressure and nasal congestion as outlined.  No sob, chest pain or tightness.  States feels similar to her previous sinus infections.  Discussed recommendation for covid testing and self quarantine.  Given directions.  Continue nasacort nasal spray and saline nasal spray.  Mucinex/robitussin DM as directed.  With progression of symptoms despite otc medication, will treat with amoxicillin.  Probiotic as directed.  Follow closely.  Update me if symptoms change or progress.      I discussed the assessment and treatment plan with the patient. The patient was provided an opportunity to ask questions and all were answered. The patient agreed with the plan and demonstrated an understanding of the instructions.   The patient was advised to call back or seek an in-person evaluation if the symptoms worsen or if the condition fails to improve as anticipated.  I provided 13 minutes of non-face-to-face time during this encounter.   Einar Pheasant, MD

## 2019-10-21 ENCOUNTER — Other Ambulatory Visit: Payer: Self-pay | Admitting: Internal Medicine

## 2019-11-20 ENCOUNTER — Telehealth: Payer: Self-pay | Admitting: Internal Medicine

## 2019-11-20 ENCOUNTER — Other Ambulatory Visit: Payer: Self-pay

## 2019-11-20 MED ORDER — MELOXICAM 7.5 MG PO TABS: 7.5000 mg | ORAL_TABLET | Freq: Every day | ORAL | 1 refills | Status: DC

## 2019-11-20 NOTE — Telephone Encounter (Signed)
Pt needs a refill on meloxicam (MOBIC) 7.5 MG tablet and would like a 90day supply called into [facility]

## 2019-11-20 NOTE — Telephone Encounter (Signed)
Medication refilled

## 2019-11-23 ENCOUNTER — Other Ambulatory Visit: Payer: Self-pay | Admitting: Internal Medicine

## 2020-01-14 ENCOUNTER — Other Ambulatory Visit: Payer: Self-pay | Admitting: Internal Medicine

## 2020-02-05 ENCOUNTER — Encounter: Payer: Self-pay | Admitting: Internal Medicine

## 2020-02-05 ENCOUNTER — Ambulatory Visit (INDEPENDENT_AMBULATORY_CARE_PROVIDER_SITE_OTHER): Payer: PPO | Admitting: Internal Medicine

## 2020-02-05 ENCOUNTER — Other Ambulatory Visit: Payer: Self-pay

## 2020-02-05 VITALS — BP 140/78 | HR 64 | Temp 96.9°F | Resp 16 | Ht 62.0 in | Wt 143.0 lb

## 2020-02-05 DIAGNOSIS — E039 Hypothyroidism, unspecified: Secondary | ICD-10-CM | POA: Diagnosis not present

## 2020-02-05 DIAGNOSIS — Z8371 Family history of colonic polyps: Secondary | ICD-10-CM | POA: Diagnosis not present

## 2020-02-05 DIAGNOSIS — E119 Type 2 diabetes mellitus without complications: Secondary | ICD-10-CM | POA: Diagnosis not present

## 2020-02-05 DIAGNOSIS — K219 Gastro-esophageal reflux disease without esophagitis: Secondary | ICD-10-CM | POA: Diagnosis not present

## 2020-02-05 DIAGNOSIS — E7800 Pure hypercholesterolemia, unspecified: Secondary | ICD-10-CM

## 2020-02-05 DIAGNOSIS — I10 Essential (primary) hypertension: Secondary | ICD-10-CM | POA: Diagnosis not present

## 2020-02-05 LAB — LIPID PANEL
Cholesterol: 226 mg/dL — ABNORMAL HIGH (ref 0–200)
HDL: 36 mg/dL — ABNORMAL LOW (ref >39.00)
NonHDL: 189.73
Total CHOL/HDL Ratio: 6
Triglycerides: 275 mg/dL — ABNORMAL HIGH (ref 0.0–149.0)
VLDL: 55 mg/dL — ABNORMAL HIGH (ref 0.0–40.0)

## 2020-02-05 LAB — CBC WITH DIFFERENTIAL/PLATELET
Basophils Absolute: 0 10*3/uL (ref 0.0–0.1)
Basophils Relative: 0.8 % (ref 0.0–3.0)
Eosinophils Absolute: 0.1 10*3/uL (ref 0.0–0.7)
Eosinophils Relative: 2.5 % (ref 0.0–5.0)
HCT: 37.6 % (ref 36.0–46.0)
Hemoglobin: 12.6 g/dL (ref 12.0–15.0)
Lymphocytes Relative: 28.8 % (ref 12.0–46.0)
Lymphs Abs: 1.6 10*3/uL (ref 0.7–4.0)
MCHC: 33.5 g/dL (ref 30.0–36.0)
MCV: 92.2 fl (ref 78.0–100.0)
Monocytes Absolute: 0.5 10*3/uL (ref 0.1–1.0)
Monocytes Relative: 9.5 % (ref 3.0–12.0)
Neutro Abs: 3.1 10*3/uL (ref 1.4–7.7)
Neutrophils Relative %: 58.4 % (ref 43.0–77.0)
Platelets: 251 10*3/uL (ref 150.0–400.0)
RBC: 4.07 Mil/uL (ref 3.87–5.11)
RDW: 12.9 % (ref 11.5–15.5)
WBC: 5.4 10*3/uL (ref 4.0–10.5)

## 2020-02-05 LAB — BASIC METABOLIC PANEL
BUN: 22 mg/dL (ref 6–23)
CO2: 26 mEq/L (ref 19–32)
Calcium: 9.8 mg/dL (ref 8.4–10.5)
Chloride: 105 mEq/L (ref 96–112)
Creatinine, Ser: 0.88 mg/dL (ref 0.40–1.20)
GFR: 62.33 mL/min (ref >60.00)
Glucose, Bld: 151 mg/dL — ABNORMAL HIGH (ref 70–99)
Potassium: 4.1 mEq/L (ref 3.5–5.1)
Sodium: 141 mEq/L (ref 135–145)

## 2020-02-05 LAB — HEPATIC FUNCTION PANEL
ALT: 10 U/L (ref 0–35)
AST: 13 U/L (ref 0–37)
Albumin: 4.3 g/dL (ref 3.5–5.2)
Alkaline Phosphatase: 73 U/L (ref 39–117)
Bilirubin, Direct: 0.1 mg/dL (ref 0.0–0.3)
Total Bilirubin: 0.7 mg/dL (ref 0.2–1.2)
Total Protein: 6.4 g/dL (ref 6.0–8.3)

## 2020-02-05 LAB — HEMOGLOBIN A1C: Hgb A1c MFr Bld: 6.8 % — ABNORMAL HIGH (ref 4.6–6.5)

## 2020-02-05 LAB — LDL CHOLESTEROL, DIRECT: Direct LDL: 119 mg/dL

## 2020-02-05 MED ORDER — HYDRALAZINE HCL 10 MG PO TABS: 10.0000 mg | ORAL_TABLET | Freq: Three times a day (TID) | ORAL | 1 refills | Status: DC

## 2020-02-05 NOTE — Progress Notes (Signed)
Patient ID: Mariah Moody, female   DOB: [birth year], 79 y.o.   MRN: [id number]   Subjective:    Patient ID: Mariah Moody, female    DOB: 05-11-1941, 79 y.o.   MRN: [id number]  HPI This visit occurred during the SARS-CoV-2 public health emergency.  Safety protocols were in place, including screening questions prior to the visit, additional usage of staff PPE, and extensive cleaning of exam room while observing appropriate contact time as indicated for disinfecting solutions.  Patient here for a scheduled follow up.  She reports she is doing relatively well. Tries to stay active.  Working.  No chest pain or sob reported.  Does report if she eats heavy and lies down, will notice some acid reflux.  Otherwise has no problem with acid reflux.  No vomiting.  No abdominal pain or bowel change noted.  Blood pressures averaging 134-136/78-80.  Some pain - bilateral SI joint. Desires no further intervention at this time.     Past Medical History:  Diagnosis Date  . Anemia   . Coronary atherosclerosis of native coronary vessel   . Diabetes mellitus without complication ([place])   . Fibroid   . GERD (gastroesophageal reflux disease)   . History of chicken pox   . Hyperlipidemia   . Hypertension   . Hypothyroidism    Past Surgical History:  Procedure Laterality Date  . COLONOSCOPY N/A 04/15/2015   Procedure: COLONOSCOPY;  Surgeon: Hulen Luster, MD;  Location: [facility] ENDOSCOPY;  Service: Gastroenterology;  Laterality: N/A;  . ESOPHAGOGASTRODUODENOSCOPY N/A 04/15/2015   Procedure: ESOPHAGOGASTRODUODENOSCOPY (EGD);  Surgeon: Hulen Luster, MD;  Location: [facility] ENDOSCOPY;  Service: Gastroenterology;  Laterality: N/A;  . TUBAL LIGATION     Family History  Problem Relation Age of Onset  . Arthritis Mother   . Hypertension Mother   . Colon polyps Mother   . Heart disease Father   . Hypertension Father   . Diabetes Brother   . Cancer Neg Hx   . Breast cancer Neg Hx    Social History   Socioeconomic  History  . Marital status: Married    Spouse name: Not on file  . Number of children: Not on file  . Years of education: Not on file  . Highest education level: Not on file  Occupational History  . Not on file  Tobacco Use  . Smoking status: Former Research scientist (life sciences)  . Smokeless tobacco: Never Used  Substance and Sexual Activity  . Alcohol use: Yes    Alcohol/week: 0.0 standard drinks    Comment: occas  . Drug use: No  . Sexual activity: Not Currently  Other Topics Concern  . Not on file  Social History Narrative  . Not on file   Social Determinants of Health   Financial Resource Strain:   . Difficulty of Paying Living Expenses:   Food Insecurity:   . Worried About Charity fundraiser in the Last Year:   . Arboriculturist in the Last Year:   Transportation Needs:   . Film/video editor (Medical):   Marland Kitchen Lack of Transportation (Non-Medical):   Physical Activity:   . Days of Exercise per Week:   . Minutes of Exercise per Session:   Stress:   . Feeling of Stress :   Social Connections:   . Frequency of Communication with Friends and Family:   . Frequency of Social Gatherings with Friends and Family:   . Attends Religious Services:   . Active Member of  Clubs or Organizations:   . Attends Archivist Meetings:   Marland Kitchen Marital Status:     Outpatient Encounter Medications as of 02/05/2020  Medication Sig  . Calcium Carbonate-Vitamin D (CALCIUM + D PO) Take 1 tablet by mouth daily.  . Cholecalciferol (VITAMIN D PO) Take 1 capsule by mouth daily.   Marland Kitchen estradiol-norethindrone (COMBIPATCH) 0.05-0.25 MG/DAY Place 1 patch onto the skin 2 (two) times a week.  . hydrALAZINE (APRESOLINE) 10 MG tablet Take 1 tablet (10 mg total) by mouth 3 (three) times daily.  . hydrochlorothiazide (HYDRODIURIL) 25 MG tablet TAKE 1 TABLET BY MOUTH EVERY DAY  . levothyroxine (SYNTHROID) 88 MCG tablet TAKE 1 TABLET (88 MCG TOTAL) DAILY BY MOUTH.  . meloxicam (MOBIC) 7.5 MG tablet Take 1 tablet (7.5 mg  total) by mouth daily.  . Multiple Vitamins-Minerals (MULTIVITAMIN PO) Take 1 tablet by mouth daily.   Marland Kitchen olmesartan (BENICAR) 40 MG tablet TAKE 1 TABLET BY MOUTH EVERY DAY  . pantoprazole (PROTONIX) 40 MG tablet TAKE 1 TABLET BY MOUTH EVERY DAY  . [DISCONTINUED] amoxicillin (AMOXIL) 875 MG tablet Take 1 tablet (875 mg total) by mouth 2 (two) times daily.   No facility-administered encounter medications on file as of 02/05/2020.    Review of Systems  Constitutional: Negative for appetite change and unexpected weight change.  HENT: Negative for congestion and sinus pressure.   Respiratory: Negative for cough, chest tightness and shortness of breath.   Cardiovascular: Negative for chest pain, palpitations and leg swelling.  Gastrointestinal: Negative for abdominal pain, diarrhea, nausea and vomiting.  Genitourinary: Negative for difficulty urinating and dysuria.  Musculoskeletal: Negative for joint swelling and myalgias.       Bilateral SI joint pain.   Skin: Negative for color change and rash.  Neurological: Negative for dizziness, light-headedness and headaches.  Psychiatric/Behavioral: Negative for agitation and dysphoric mood.       Objective:    Physical Exam Constitutional:      General: She is not in acute distress.    Appearance: Normal appearance.  HENT:     Head: Normocephalic and atraumatic.     Right Ear: External ear normal.     Left Ear: External ear normal.  Eyes:     General: No scleral icterus.       Right eye: No discharge.        Left eye: No discharge.     Conjunctiva/sclera: Conjunctivae normal.  Neck:     Thyroid: No thyromegaly.  Cardiovascular:     Rate and Rhythm: Normal rate and regular rhythm.  Pulmonary:     Effort: No respiratory distress.     Breath sounds: Normal breath sounds. No wheezing.  Abdominal:     General: Bowel sounds are normal.     Palpations: Abdomen is soft.     Tenderness: There is no abdominal tenderness.  Musculoskeletal:         General: No swelling or tenderness.     Cervical back: Neck supple. No tenderness.  Lymphadenopathy:     Cervical: No cervical adenopathy.  Skin:    Findings: No erythema or rash.  Neurological:     Mental Status: She is alert.  Psychiatric:        Mood and Affect: Mood normal.        Behavior: Behavior normal.     BP 140/78   Pulse 64   Temp (!) 96.9 F (36.1 C)   Resp 16   Ht _0  (1.575 m)  Wt 143 lb (64.9 kg)   SpO2 98%   BMI 26.16 kg/m  Wt Readings from Last 3 Encounters:  02/05/20 143 lb (64.9 kg)  10/11/19 141 lb (64 kg)  10/02/19 141 lb 9.6 oz (64.2 kg)     Lab Results  Component Value Date   WBC 5.4 02/05/2020   HGB 12.6 02/05/2020   HCT 37.6 02/05/2020   PLT 251.0 02/05/2020   GLUCOSE 151 (H) 02/05/2020   CHOL 226 (H) 02/05/2020   TRIG 275.0 (H) 02/05/2020   HDL 36.00 (L) 02/05/2020   LDLDIRECT 119.0 02/05/2020   LDLCALC 135 (H) 02/25/2016   ALT 10 02/05/2020   AST 13 02/05/2020   NA 141 02/05/2020   K 4.1 02/05/2020   CL 105 02/05/2020   CREATININE 0.88 02/05/2020   BUN 22 02/05/2020   CO2 26 02/05/2020   TSH 3.23 05/16/2019   HGBA1C 6.8 (H) 02/05/2020   MICROALBUR 0.8 10/02/2019    MM 3D SCREEN BREAST BILATERAL  Result Date: 12/05/2018 CLINICAL DATA:  Screening. EXAM: DIGITAL SCREENING BILATERAL MAMMOGRAM WITH TOMO AND CAD COMPARISON:  Previous exam(s). ACR Breast Density Category b: There are scattered areas of fibroglandular density. FINDINGS: There are no findings suspicious for malignancy. Images were processed with CAD. IMPRESSION: No mammographic evidence of malignancy. A result letter of this screening mammogram will be mailed directly to the patient. RECOMMENDATION: Screening mammogram in one year. (Code:SM-B-01Y) BI-RADS CATEGORY  1: Negative. Electronically Signed   By: Ammie Ferrier M.D.   On: 12/05/2018 08:41       Assessment & Plan:   Problem List Items Addressed This Visit    Diabetes mellitus type II, controlled,  with no complications ([place]) - Primary    Low carb diet and exercise.  Follow met b and a1c.        Relevant Orders   Hemoglobin A1c (Completed)   Essential hypertension, benign    Blood pressure as outlined.  Still elevated above goal.  On benicar and hctz.  Add hydralazine 31m tid.  Follow pressures.  Follow metabolic panel.  Get her back in soon to reassess.       Relevant Medications   hydrALAZINE (APRESOLINE) 10 MG tablet   Other Relevant Orders   CBC with Differential/Platelet (Completed)   Basic metabolic panel (Completed)   Family history of colonic polyps    Colonoscopy 04/2015 - normal.  States does not need f/u colonoscopy.       GERD (gastroesophageal reflux disease)    On protonix.  Only has issues if eats a heavy meal and lies down.  Otherwise symptoms controlled.        Hypercholesterolemia    Low cholesterol diet and exercise.  Have discussed calculated cholesterol risk.  Have discussed recommendation to start cholesterol medication.  Has declined.  Follow lipid panel.       Relevant Medications   hydrALAZINE (APRESOLINE) 10 MG tablet   Other Relevant Orders   Hepatic function panel (Completed)   Lipid panel (Completed)   Hypothyroidism    On thyroid replacement.  Follow tsh.           CEinar Pheasant MD

## 2020-02-06 ENCOUNTER — Other Ambulatory Visit: Payer: Self-pay | Admitting: Internal Medicine

## 2020-02-06 DIAGNOSIS — Z1231 Encounter for screening mammogram for malignant neoplasm of breast: Secondary | ICD-10-CM

## 2020-02-11 ENCOUNTER — Encounter: Payer: Self-pay | Admitting: Internal Medicine

## 2020-02-11 NOTE — Assessment & Plan Note (Addendum)
Blood pressure as outlined.  Still elevated above goal.  On benicar and hctz.  Add hydralazine 10mg  tid.  Follow pressures.  Follow metabolic panel.  Get her back in soon to reassess.

## 2020-02-11 NOTE — Assessment & Plan Note (Signed)
Colonoscopy 04/2015 - normal.  States does not need f/u colonoscopy.

## 2020-02-11 NOTE — Assessment & Plan Note (Addendum)
On protonix.  Only has issues if eats a heavy meal and lies down.  Otherwise symptoms controlled.

## 2020-02-11 NOTE — Assessment & Plan Note (Signed)
On thyroid replacement.  Follow tsh.  

## 2020-02-11 NOTE — Assessment & Plan Note (Signed)
Low carb diet and exercise.  Follow met b and a1c.   

## 2020-02-11 NOTE — Assessment & Plan Note (Signed)
Low cholesterol diet and exercise.  Have discussed calculated cholesterol risk.  Have discussed recommendation to start cholesterol medication.  Has declined.  Follow lipid panel.

## 2020-02-26 ENCOUNTER — Ambulatory Visit
Admission: RE | Admit: 2020-02-26 | Discharge: 2020-02-26 | Disposition: A | Discharge status: Home / Self Care | Payer: PPO | Source: Ambulatory Visit | Attending: Internal Medicine | Admitting: Internal Medicine

## 2020-02-26 DIAGNOSIS — Z1231 Encounter for screening mammogram for malignant neoplasm of breast: Secondary | ICD-10-CM | POA: Diagnosis not present

## 2020-02-26 IMAGING — MG DIGITAL SCREENING BILAT W/ TOMO W/ CAD
6 of 10 series · 6 of 30 positions shown · non-contrast
Comparison: Previous exam(s).

CLINICAL DATA: Screening.

EXAM:
DIGITAL SCREENING BILATERAL MAMMOGRAM WITH TOMO AND CAD

[L CC synth-2D (1 of 2)]
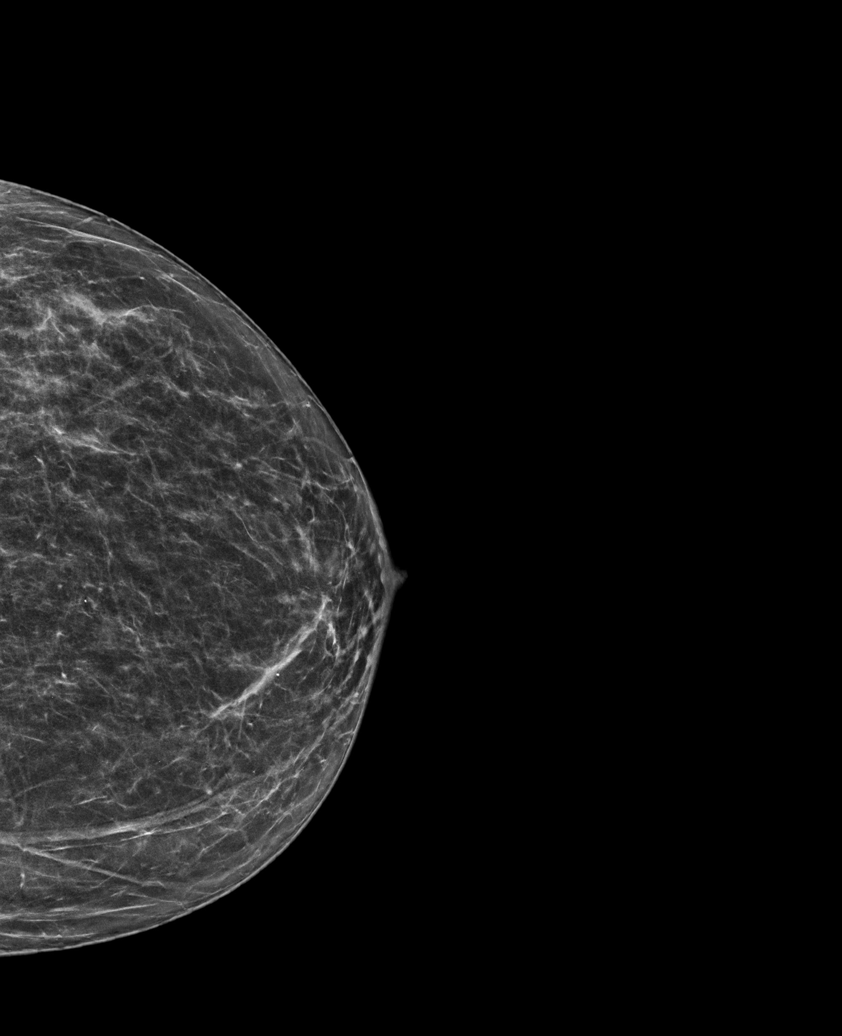

[L CC synth-2D (2 of 2)]
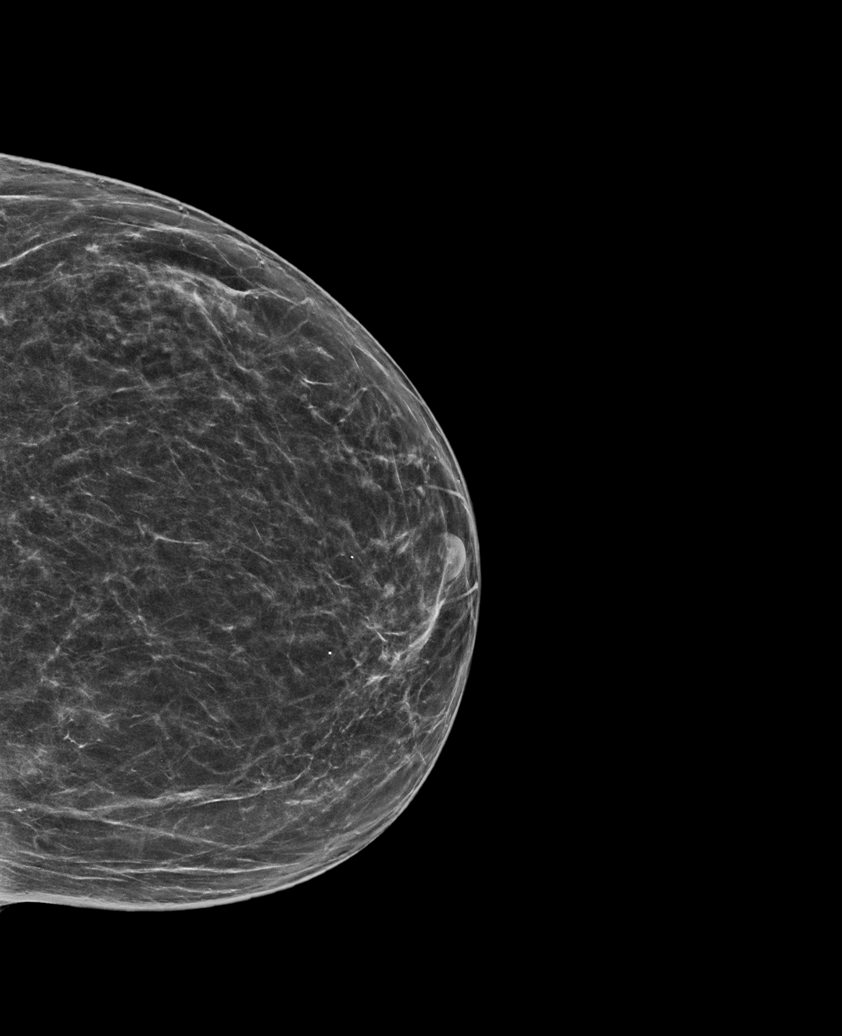

[L MLO synth-2D]
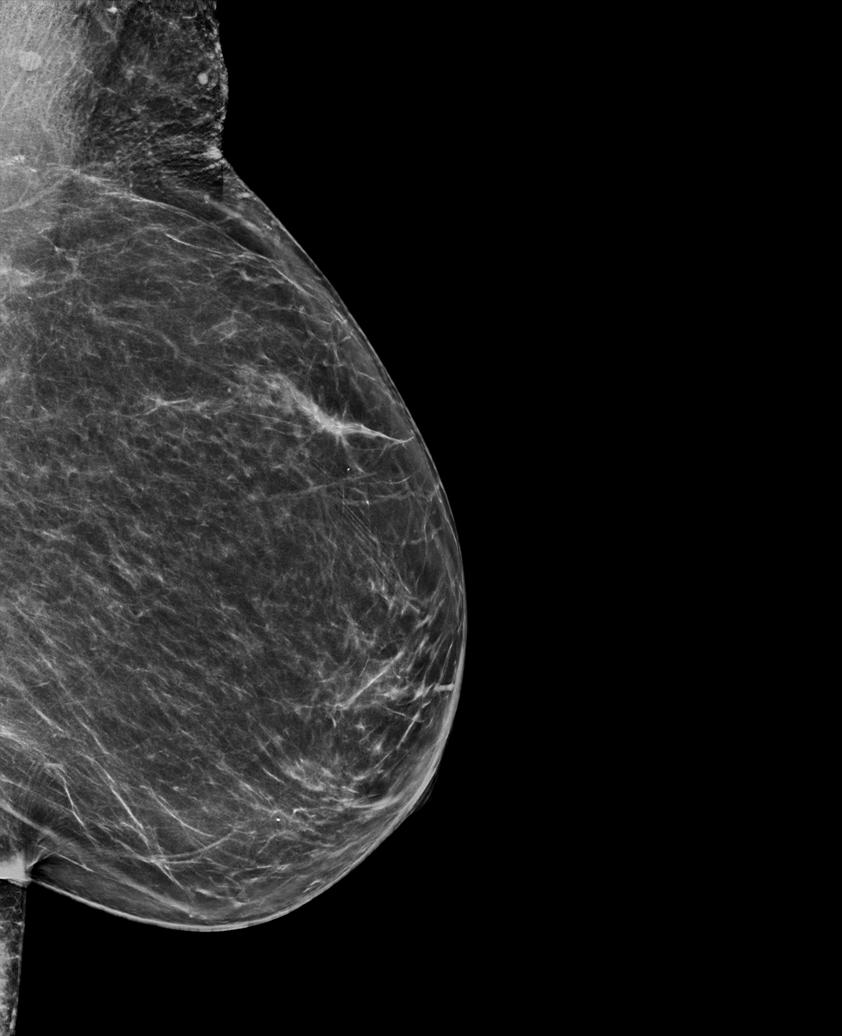

[R MLO synth-2D]
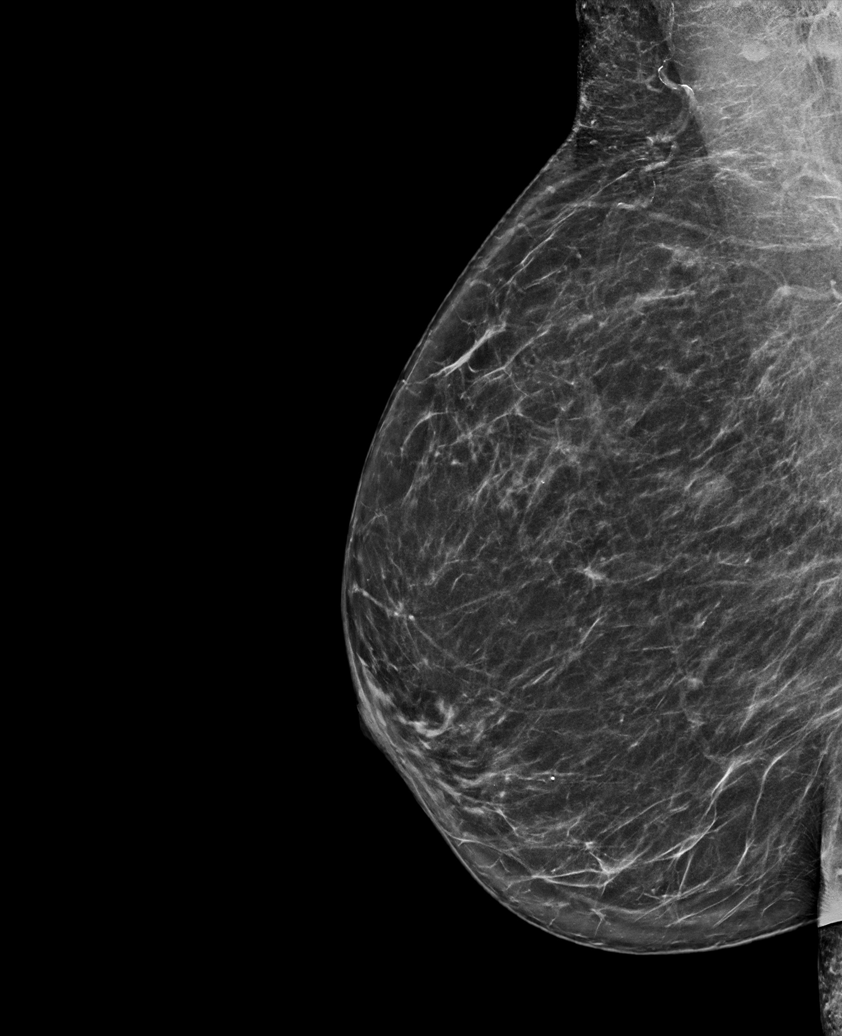

[R CC synth-2D]
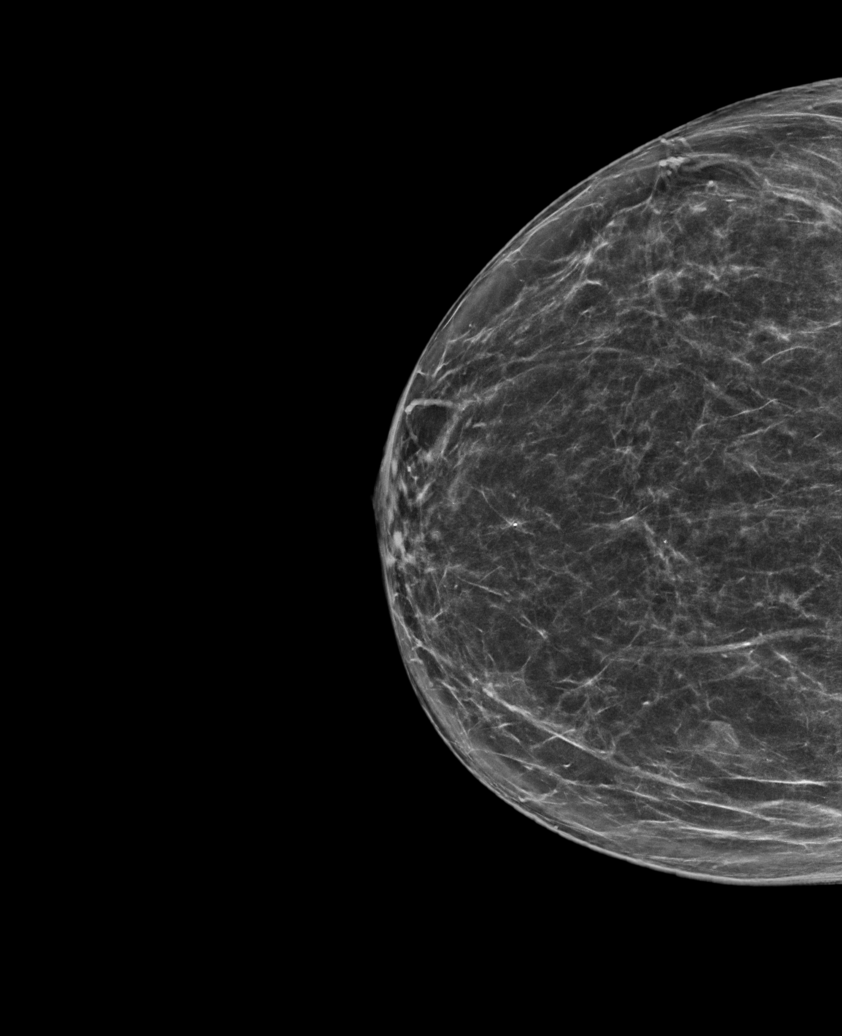

[R CC tomo · tomo slice 38/75.0]
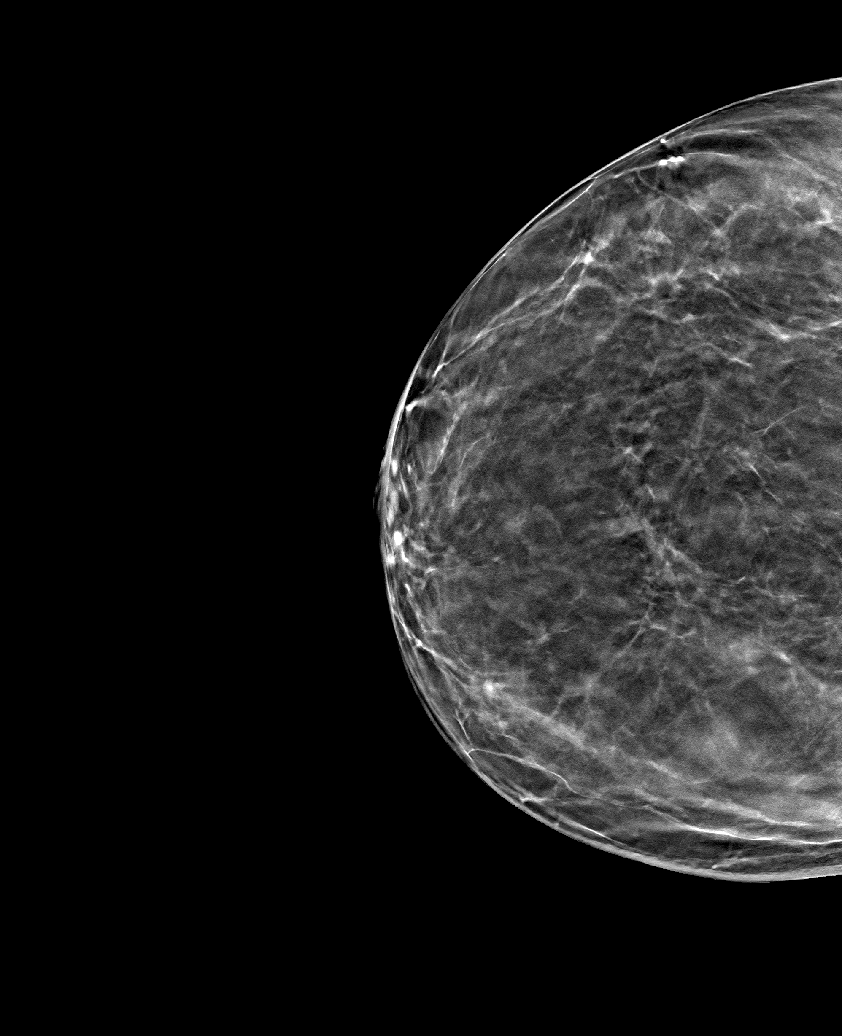

[6 of 30 positions shown; findings below may reference images not displayed]

ACR Breast Density Category b: There are scattered areas of
fibroglandular density.
FINDINGS: There are no findings suspicious for malignancy. Images were
processed with CAD.
IMPRESSION: No mammographic evidence of malignancy. A result letter of this
screening mammogram will be mailed directly to the patient.

RECOMMENDATION:
Screening mammogram in one year. (Code:[TQ])

BI-RADS CATEGORY  1: Negative.

## 2020-02-27 ENCOUNTER — Other Ambulatory Visit: Payer: Self-pay | Admitting: Internal Medicine

## 2020-03-18 ENCOUNTER — Encounter: Payer: Self-pay | Admitting: Internal Medicine

## 2020-03-18 ENCOUNTER — Other Ambulatory Visit: Payer: Self-pay

## 2020-03-18 ENCOUNTER — Ambulatory Visit (INDEPENDENT_AMBULATORY_CARE_PROVIDER_SITE_OTHER): Payer: PPO | Admitting: Internal Medicine

## 2020-03-18 DIAGNOSIS — E119 Type 2 diabetes mellitus without complications: Secondary | ICD-10-CM | POA: Diagnosis not present

## 2020-03-18 DIAGNOSIS — R0989 Other specified symptoms and signs involving the circulatory and respiratory systems: Secondary | ICD-10-CM | POA: Diagnosis not present

## 2020-03-18 DIAGNOSIS — K219 Gastro-esophageal reflux disease without esophagitis: Secondary | ICD-10-CM

## 2020-03-18 DIAGNOSIS — I10 Essential (primary) hypertension: Secondary | ICD-10-CM | POA: Diagnosis not present

## 2020-03-18 DIAGNOSIS — R0789 Other chest pain: Secondary | ICD-10-CM

## 2020-03-18 DIAGNOSIS — E7800 Pure hypercholesterolemia, unspecified: Secondary | ICD-10-CM | POA: Diagnosis not present

## 2020-03-18 DIAGNOSIS — E039 Hypothyroidism, unspecified: Secondary | ICD-10-CM | POA: Diagnosis not present

## 2020-03-18 LAB — URINALYSIS, ROUTINE W REFLEX MICROSCOPIC
Bilirubin Urine: NEGATIVE
Hgb urine dipstick: NEGATIVE
Ketones, ur: NEGATIVE
Leukocytes,Ua: NEGATIVE
Nitrite: NEGATIVE
RBC / HPF: NONE SEEN (ref 0–?)
Specific Gravity, Urine: 1.015 (ref 1.000–1.030)
Total Protein, Urine: NEGATIVE
Urine Glucose: NEGATIVE
Urobilinogen, UA: 0.2 (ref 0.0–1.0)
pH: 6 (ref 5.0–8.0)

## 2020-03-18 LAB — BASIC METABOLIC PANEL
BUN: 24 mg/dL — ABNORMAL HIGH (ref 6–23)
CO2: 28 mEq/L (ref 19–32)
Calcium: 9.4 mg/dL (ref 8.4–10.5)
Chloride: 103 mEq/L (ref 96–112)
Creatinine, Ser: 0.89 mg/dL (ref 0.40–1.20)
GFR: 61.5 mL/min (ref >60.00)
Glucose, Bld: 171 mg/dL — ABNORMAL HIGH (ref 70–99)
Potassium: 3.9 mEq/L (ref 3.5–5.1)
Sodium: 138 mEq/L (ref 135–145)

## 2020-03-18 MED ORDER — HYDRALAZINE HCL 25 MG PO TABS: 25.0000 mg | ORAL_TABLET | Freq: Three times a day (TID) | ORAL | 2 refills | Status: DC

## 2020-03-18 NOTE — Assessment & Plan Note (Addendum)
Blood pressure remaining elevated.  On hydralazine, hctz and benicar.  Intolerant to amlodipine.  Stop otc sinus medication.  Increase hydralazine to 25mg  tid.  With abdominal bruit.  Check vascular duplex.

## 2020-03-18 NOTE — Assessment & Plan Note (Signed)
Described chest tightness with minimal exertion.  Stopped and rested - symptoms resolved.  EKG - SR with flattening T wave - III and non specific ST/T wave changes.  Given symptoms, risk factors and EKG findings, I do feel she warrants further cardiac w/up.  Refer to cardiology.

## 2020-03-18 NOTE — Progress Notes (Signed)
Patient ID: Mariah Moody, female   DOB: September 15, 1941, 79 y.o.   MRN: [id number]   Subjective:    Patient ID: Mariah Moody, female    DOB: 09-08-1941, 79 y.o.   MRN: [id number]  HPI This visit occurred during the SARS-CoV-2 public health emergency.  Safety protocols were in place, including screening questions prior to the visit, additional usage of staff PPE, and extensive cleaning of exam room while observing appropriate contact time as indicated for disinfecting solutions.  Patient here for a scheduled follow up. Scheduled to follow up on her blood pressure.  Started on hydralazine last visit.  Blood pressure has remained 130-140s/70s.  Last couple of days has noticed blood pressure 932 systolic.  She has been having increased allergy symptoms.  No sinus pressure.  No significant headache or dizziness.  Minimal occasional light headedness if stands quick.  Nothing on a regular basis.  Increased nasal congestion and increased drainage.  Increased cough when lying down.  No chest congestion.  No wheezing.  Does report that two weeks ago, she went on a 15 minute walk.  Noticed chest tightness.  Stopped and rested and pain subsided.  States if she does not lie down after eating, acid reflux is controlled.  No abdominal pain.  Bowels moving.  Handling stress.  For the congestion symptoms, she has been taking nyquil and dayquil recently.     Past Medical History:  Diagnosis Date  . Anemia   . Coronary atherosclerosis of native coronary vessel   . Diabetes mellitus without complication ([place])   . Fibroid   . GERD (gastroesophageal reflux disease)   . History of chicken pox   . Hyperlipidemia   . Hypertension   . Hypothyroidism    Past Surgical History:  Procedure Laterality Date  . COLONOSCOPY N/A 04/15/2015   Procedure: COLONOSCOPY;  Surgeon: Hulen Luster, MD;  Location: [facility] ENDOSCOPY;  Service: Gastroenterology;  Laterality: N/A;  . ESOPHAGOGASTRODUODENOSCOPY N/A 04/15/2015   Procedure:  ESOPHAGOGASTRODUODENOSCOPY (EGD);  Surgeon: Hulen Luster, MD;  Location: [facility] ENDOSCOPY;  Service: Gastroenterology;  Laterality: N/A;  . TUBAL LIGATION     Family History  Problem Relation Age of Onset  . Arthritis Mother   . Hypertension Mother   . Colon polyps Mother   . Heart disease Father   . Hypertension Father   . Diabetes Brother   . Cancer Neg Hx   . Breast cancer Neg Hx    Social History   Socioeconomic History  . Marital status: Married    Spouse name: Not on file  . Number of children: Not on file  . Years of education: Not on file  . Highest education level: Not on file  Occupational History  . Not on file  Tobacco Use  . Smoking status: Former Research scientist (life sciences)  . Smokeless tobacco: Never Used  Substance and Sexual Activity  . Alcohol use: Yes    Alcohol/week: 0.0 standard drinks    Comment: occas  . Drug use: No  . Sexual activity: Not Currently  Other Topics Concern  . Not on file  Social History Narrative  . Not on file   Social Determinants of Health   Financial Resource Strain:   . Difficulty of Paying Living Expenses:   Food Insecurity:   . Worried About Charity fundraiser in the Last Year:   . Arboriculturist in the Last Year:   Transportation Needs:   . Film/video editor (Medical):   Marland Kitchen  Lack of Transportation (Non-Medical):   Physical Activity:   . Days of Exercise per Week:   . Minutes of Exercise per Session:   Stress:   . Feeling of Stress :   Social Connections:   . Frequency of Communication with Friends and Family:   . Frequency of Social Gatherings with Friends and Family:   . Attends Religious Services:   . Active Member of Clubs or Organizations:   . Attends Archivist Meetings:   Marland Kitchen Marital Status:     Outpatient Encounter Medications as of 03/18/2020  Medication Sig  . Calcium Carbonate-Vitamin D (CALCIUM + D PO) Take 1 tablet by mouth daily.  . Cholecalciferol (VITAMIN D PO) Take 1 capsule by mouth daily.   Marland Kitchen  estradiol-norethindrone (COMBIPATCH) 0.05-0.25 MG/DAY Place 1 patch onto the skin 2 (two) times a week.  . hydrALAZINE (APRESOLINE) 25 MG tablet Take 1 tablet (25 mg total) by mouth 3 (three) times daily.  . hydrochlorothiazide (HYDRODIURIL) 25 MG tablet TAKE 1 TABLET BY MOUTH EVERY DAY  . levothyroxine (SYNTHROID) 88 MCG tablet TAKE 1 TABLET (88 MCG TOTAL) DAILY BY MOUTH.  . meloxicam (MOBIC) 7.5 MG tablet Take 1 tablet (7.5 mg total) by mouth daily.  . Multiple Vitamins-Minerals (MULTIVITAMIN PO) Take 1 tablet by mouth daily.   Marland Kitchen olmesartan (BENICAR) 40 MG tablet TAKE 1 TABLET BY MOUTH EVERY DAY  . pantoprazole (PROTONIX) 40 MG tablet TAKE 1 TABLET BY MOUTH EVERY DAY  . [DISCONTINUED] hydrALAZINE (APRESOLINE) 10 MG tablet TAKE 1 TABLET BY MOUTH THREE TIMES A DAY   No facility-administered encounter medications on file as of 03/18/2020.   Review of Systems  Constitutional: Negative for appetite change and unexpected weight change.  HENT: Positive for congestion and postnasal drip. Negative for sinus pressure.   Respiratory: Positive for cough and chest tightness. Negative for shortness of breath.   Cardiovascular: Negative for palpitations and leg swelling.  Gastrointestinal: Negative for abdominal pain, diarrhea and nausea.       Acid reflux - lying down after eating.    Genitourinary: Negative for difficulty urinating and dysuria.  Musculoskeletal: Negative for joint swelling and myalgias.  Skin: Negative for color change and rash.  Neurological:       Occasional light headedness if stands quickly.  No significant dizziness.    Psychiatric/Behavioral: Negative for agitation and dysphoric mood.       Objective:    Physical Exam Constitutional:      General: She is not in acute distress.    Appearance: Normal appearance.  HENT:     Head: Normocephalic and atraumatic.     Right Ear: External ear normal.     Left Ear: External ear normal.     Nose: No congestion.  Eyes:      General: No scleral icterus.       Right eye: No discharge.        Left eye: No discharge.     Conjunctiva/sclera: Conjunctivae normal.  Neck:     Thyroid: No thyromegaly.  Cardiovascular:     Rate and Rhythm: Normal rate and regular rhythm.  Pulmonary:     Effort: No respiratory distress.     Breath sounds: Normal breath sounds. No wheezing.  Abdominal:     General: Bowel sounds are normal.     Palpations: Abdomen is soft.     Tenderness: There is no abdominal tenderness.     Comments: Abdominal bruit  Musculoskeletal:  General: No swelling or tenderness.     Cervical back: Neck supple. No tenderness.  Lymphadenopathy:     Cervical: No cervical adenopathy.  Skin:    Findings: No erythema or rash.  Neurological:     Mental Status: She is alert.  Psychiatric:        Mood and Affect: Mood normal.        Behavior: Behavior normal.     BP (!) 182/80   Pulse 82   Temp (!) 96.7 F (35.9 C)   Resp 16   Ht '5\' 2"'  (1.575 m)   Wt 144 lb (65.3 kg)   SpO2 98%   BMI 26.34 kg/m  Wt Readings from Last 3 Encounters:  03/18/20 144 lb (65.3 kg)  02/05/20 143 lb (64.9 kg)  10/11/19 141 lb (64 kg)     Lab Results  Component Value Date   WBC 5.4 02/05/2020   HGB 12.6 02/05/2020   HCT 37.6 02/05/2020   PLT 251.0 02/05/2020   GLUCOSE 171 (H) 03/18/2020   CHOL 226 (H) 02/05/2020   TRIG 275.0 (H) 02/05/2020   HDL 36.00 (L) 02/05/2020   LDLDIRECT 119.0 02/05/2020   LDLCALC 135 (H) 02/25/2016   ALT 10 02/05/2020   AST 13 02/05/2020   NA 138 03/18/2020   K 3.9 03/18/2020   CL 103 03/18/2020   CREATININE 0.89 03/18/2020   BUN 24 (H) 03/18/2020   CO2 28 03/18/2020   TSH 3.23 05/16/2019   HGBA1C 6.8 (H) 02/05/2020   MICROALBUR 0.8 10/02/2019    MM 3D SCREEN BREAST BILATERAL  Result Date: 02/26/2020 CLINICAL DATA:  Screening. EXAM: DIGITAL SCREENING BILATERAL MAMMOGRAM WITH TOMO AND CAD COMPARISON:  Previous exam(s). ACR Breast Density Category b: There are scattered  areas of fibroglandular density. FINDINGS: There are no findings suspicious for malignancy. Images were processed with CAD. IMPRESSION: No mammographic evidence of malignancy. A result letter of this screening mammogram will be mailed directly to the patient. RECOMMENDATION: Screening mammogram in one year. (Code:SM-B-01Y) BI-RADS CATEGORY  1: Negative. Electronically Signed   By: Lillia Mountain M.D.   On: 02/26/2020 13:19       Assessment & Plan:   Problem List Items Addressed This Visit    Abdominal bruit    Abdominal bruit noted on exam.  Elevated blood pressure.  Schedule renal vascular duplex.  Also schedule aortic ultrasound.        Relevant Orders   VAS US RENAL ARTERY DUPLEX   Chest tightness    Described chest tightness with minimal exertion.  Stopped and rested - symptoms resolved.  EKG - SR with flattening T wave - III and non specific ST/T wave changes.  Given symptoms, risk factors and EKG findings, I do feel she warrants further cardiac w/up.  Refer to cardiology.        Relevant Orders   EKG 12-Lead   Ambulatory referral to Cardiology   Diabetes mellitus type II, controlled, with no complications (HCC)    Low carb diet and exercise.  Follow met b and a1c.        Essential hypertension, benign    Blood pressure remaining elevated.  On hydralazine, hctz and benicar.  Intolerant to amlodipine.  Stop otc sinus medication.  Increase hydralazine to 84m tid.  With abdominal bruit.  Check vascular duplex.          Relevant Medications   hydrALAZINE (APRESOLINE) 25 MG tablet   Other Relevant Orders   Basic metabolic panel (Completed)  Urinalysis, Routine w reflex microscopic (Completed)   VAS US RENAL ARTERY DUPLEX   GERD (gastroesophageal reflux disease)    On protonix.  Reports acid reflux controled if she does not lie down after eating.  Does report the increased cough at night.  Treat allergy symptoms.  Also add pepcid 46m before evening meal.  Follow.        Hypercholesterolemia    Have discussed cholesterol risk and starting medication.  She has declined.  Low cholesterol diet and exercise.  Follow lipid panel.       Relevant Medications   hydrALAZINE (APRESOLINE) 25 MG tablet   Hypothyroidism    On thyroid replacement.  Follow tsh.          I spent 40 minutes with the patient and more than 50% of the time was spent in consultation regarding the above.  Time spent discussing her current symptoms and concerns.  Time also spent discussing further w/up and evaluation and treatment plan.   CEinar Pheasant MD

## 2020-03-18 NOTE — Patient Instructions (Signed)
Saline nasal spray - flush nose at least 2-3x/day  Continue nasacort  Robitussin DM twice a day as needed for cough/congestion  pepcid 20mg  - 30 minutes before evening meal.

## 2020-03-19 DIAGNOSIS — R0989 Other specified symptoms and signs involving the circulatory and respiratory systems: Secondary | ICD-10-CM | POA: Insufficient documentation

## 2020-03-19 NOTE — Assessment & Plan Note (Signed)
Low carb diet and exercise.  Follow met b and a1c.   

## 2020-03-19 NOTE — Assessment & Plan Note (Signed)
On protonix.  Reports acid reflux controled if she does not lie down after eating.  Does report the increased cough at night.  Treat allergy symptoms.  Also add pepcid 20mg  before evening meal.  Follow.

## 2020-03-19 NOTE — Assessment & Plan Note (Signed)
Have discussed cholesterol risk and starting medication.  She has declined.  Low cholesterol diet and exercise.  Follow lipid panel.

## 2020-03-19 NOTE — Assessment & Plan Note (Signed)
Abdominal bruit noted on exam.  Elevated blood pressure.  Schedule renal vascular duplex.  Also schedule aortic ultrasound.

## 2020-03-19 NOTE — Assessment & Plan Note (Signed)
On thyroid replacement.  Follow tsh.  

## 2020-03-25 ENCOUNTER — Ambulatory Visit: Payer: PPO | Admitting: Cardiology

## 2020-03-26 ENCOUNTER — Other Ambulatory Visit: Payer: Self-pay | Admitting: Internal Medicine

## 2020-03-26 DIAGNOSIS — R0989 Other specified symptoms and signs involving the circulatory and respiratory systems: Secondary | ICD-10-CM

## 2020-03-26 DIAGNOSIS — I10 Essential (primary) hypertension: Secondary | ICD-10-CM

## 2020-03-26 NOTE — Progress Notes (Signed)
Order placed for aorta/iliac duplex.

## 2020-04-05 ENCOUNTER — Ambulatory Visit: Payer: PPO | Admitting: Cardiology

## 2020-04-08 ENCOUNTER — Other Ambulatory Visit: Payer: Self-pay | Admitting: Internal Medicine

## 2020-04-08 NOTE — Progress Notes (Signed)
Cardiology Office Note  Date:  04/10/2020   ID:  Mariah Moody, [place] June 28, 1941, MRN [id number]  PCP:  Mariah Pheasant, MD   Chief Complaint  Patient presents with   New Patient (Initial Visit)    Referred by pcp for chest tightness/ SOB/ Swelling. Patient states that walking brings on the chest tightness. Meds reviewed verbally with patient.     HPI:  [name] is a 79 year old woman with past medical history of Hypertension Diabetes Hyperlipidemia Referred by Mariah Moody her for evaluation of her chest tightness, hyperlipidemia  Recent activity, noticed some chest tightness when doing 15-minute walk Stopped and rested and symptoms resolved Denies GERD symptoms Recent sinus congestion last month   In general blood pressure control but sometimes labile Stressful work schedule works 10 hours a day despite Symptoms well controlled other times runs high Bothered by the hydralazine thinks it is hurting her hands does not like frequency of pills  No prior cardiac imaging studies available Though she reports prior stress test and catheterization years ago Aorta and renal artery duplex has been ordered by primary care  Lab work reviewed LDL 119 Total cholesterol 226 Hemoglobin A1c 6.8  Tolerating olmesartan, HCTZ, hydralazine 3 times daily  EKG personally reviewed by myself on todays visit Shows normal sinus rhythm with rate 67 bpm no significant ST-T wave changes  Family history discussed Dad with CAD  PMH:   has a past medical history of Anemia, Coronary atherosclerosis of native coronary vessel, Diabetes mellitus without complication ([place]), Fibroid, GERD (gastroesophageal reflux disease), History of chicken pox, Hyperlipidemia, Hypertension, and Hypothyroidism.  PSH:    Past Surgical History:  Procedure Laterality Date   COLONOSCOPY N/A 04/15/2015   Procedure: COLONOSCOPY;  Surgeon: Hulen Luster, MD;  Location: [facility] ENDOSCOPY;  Service: Gastroenterology;   Laterality: N/A;   ESOPHAGOGASTRODUODENOSCOPY N/A 04/15/2015   Procedure: ESOPHAGOGASTRODUODENOSCOPY (EGD);  Surgeon: Hulen Luster, MD;  Location: [facility] ENDOSCOPY;  Service: Gastroenterology;  Laterality: N/A;   TUBAL LIGATION      Current Outpatient Medications  Medication Sig Dispense Refill   Calcium Carbonate-Vitamin D (CALCIUM + D PO) Take 1 tablet by mouth daily.     Cholecalciferol (VITAMIN D PO) Take 1 capsule by mouth daily.      estradiol-norethindrone (COMBIPATCH) 0.05-0.25 MG/DAY Place 1 patch onto the skin 2 (two) times a week. 24 patch 0   hydrochlorothiazide (HYDRODIURIL) 25 MG tablet TAKE 1 TABLET BY MOUTH EVERY DAY 90 tablet 1   levothyroxine (SYNTHROID) 88 MCG tablet TAKE 1 TABLET (88 MCG TOTAL) DAILY BY MOUTH. 90 tablet 0   meloxicam (MOBIC) 7.5 MG tablet Take 1 tablet (7.5 mg total) by mouth daily. 90 tablet 1   Multiple Vitamins-Minerals (MULTIVITAMIN PO) Take 1 tablet by mouth daily.      olmesartan (BENICAR) 40 MG tablet TAKE 1 TABLET BY MOUTH EVERY DAY 90 tablet 0   pantoprazole (PROTONIX) 40 MG tablet TAKE 1 TABLET BY MOUTH EVERY DAY 90 tablet 3   amLODipine (NORVASC) 5 MG tablet Take 1 tablet (5 mg total) by mouth daily. 90 tablet 3   No current facility-administered medications for this visit.     Allergies:   Statins, Sulfa antibiotics, and Iodine   Social History:  The patient  reports that she has quit smoking. She has never used smokeless tobacco. She reports current alcohol use. She reports that she does not use drugs.   Family History:   family history includes Arthritis in her mother; Colon polyps  in her mother; Diabetes in her brother; Heart disease in her father; Hypertension in her father and mother.    Review of Systems: Review of Systems  Constitutional: Negative.   HENT: Negative.   Respiratory: Negative.   Cardiovascular: Negative.   Gastrointestinal: Negative.   Musculoskeletal: Negative.   Neurological: Negative.    Psychiatric/Behavioral: Negative.   All other systems reviewed and are negative.    PHYSICAL EXAM: VS:  BP 132/64 (BP Location: Right Arm, Patient Position: Sitting, Cuff Size: Normal)    Pulse 67    Ht 5\' 2"  (1.575 m)    Wt 142 lb (64.4 kg)    SpO2 97%    BMI 25.97 kg/m  , BMI Body mass index is 25.97 kg/m. GEN: Well nourished, well developed, in no acute distress HEENT: normal Neck: no JVD, carotid bruits, or masses Cardiac: RRR; no murmurs, rubs, or gallops,no edema  Respiratory:  clear to auscultation bilaterally, normal work of breathing GI: soft, nontender, nondistended, + BS MS: no deformity or atrophy Skin: warm and dry, no rash Neuro:  Strength and sensation are intact Psych: euthymic mood, full affect   Recent Labs: 05/16/2019: TSH 3.23 02/05/2020: ALT 10; Hemoglobin 12.6; Platelets 251.0 03/18/2020: BUN 24; Creatinine, Ser 0.89; Potassium 3.9; Sodium 138    Lipid Panel Lab Results  Component Value Date   CHOL 226 (H) 02/05/2020   HDL 36.00 (L) 02/05/2020   LDLCALC 135 (H) 02/25/2016   TRIG 275.0 (H) 02/05/2020      Wt Readings from Last 3 Encounters:  04/10/20 142 lb (64.4 kg)  03/18/20 144 lb (65.3 kg)  02/05/20 143 lb (64.9 kg)       ASSESSMENT AND PLAN:  Problem List Items Addressed This Visit      Cardiology Problems   Essential hypertension, benign   Relevant Medications   amLODipine (NORVASC) 5 MG tablet   Hypercholesterolemia   Relevant Medications   amLODipine (NORVASC) 5 MG tablet     Other   Diabetes mellitus type II, controlled, with no complications (HCC)   Chest tightness - Primary   Relevant Orders   EKG 12-Lead   CT CARDIAC SCORING     Chest tightness Atypical in nature, reports having good exercise tolerance To that effect she was recently mulching her yard with no symptoms No recurrent symptoms with activity, less likely angina She does have risk factors including diabetes and hyperlipidemia -Discussed stress testing, she  prefers CT coronary calcium scoring Calcium score has been ordered at her request This will help guide whether more aggressive management is needed of her hyperlipidemia For markedly elevated calcium score she may need stress testing  Hyperlipidemia Statin intolerance, may need Zetia for elevated calcium scoring  Hypertension She would like to stop the hydralazine given the frequency of pills and symptoms in her hands We did review old records, previously tolerated amlodipine 5, 10 mg gave her leg swelling Suggested she hold the hydralazine and start amlodipine 5 If tolerated could potentially take a triple combo pill  Diabetes type 2 Continue lifestyle modification recommended Numbers relatively well controlled   Disposition:   F/U as needed,  She will call us with blood pressure measurements, we will call with results of the calcium scoring   Total encounter time more than 60 minutes  Greater than 50% was spent in counseling and coordination of care with the patient    Signed, Esmond Plants, M.D., Ph.D. [place], [place]

## 2020-04-09 ENCOUNTER — Telehealth: Payer: Self-pay | Admitting: Internal Medicine

## 2020-04-09 NOTE — Telephone Encounter (Signed)
I have previously placed order for vascular ultrasounds (renal arthery duplex and vas US aorta/ivc/iliacs.  Please schedule for pt.  Thanks.

## 2020-04-10 ENCOUNTER — Other Ambulatory Visit: Payer: Self-pay

## 2020-04-10 ENCOUNTER — Ambulatory Visit: Payer: PPO | Admitting: Cardiovascular Disease

## 2020-04-10 ENCOUNTER — Encounter: Payer: Self-pay | Admitting: Cardiovascular Disease

## 2020-04-10 VITALS — BP 132/64 | HR 67 | Ht 62.0 in | Wt 142.0 lb

## 2020-04-10 DIAGNOSIS — E7800 Pure hypercholesterolemia, unspecified: Secondary | ICD-10-CM

## 2020-04-10 DIAGNOSIS — I10 Essential (primary) hypertension: Secondary | ICD-10-CM | POA: Diagnosis not present

## 2020-04-10 DIAGNOSIS — E119 Type 2 diabetes mellitus without complications: Secondary | ICD-10-CM | POA: Diagnosis not present

## 2020-04-10 DIAGNOSIS — R0789 Other chest pain: Secondary | ICD-10-CM | POA: Diagnosis not present

## 2020-04-10 MED ORDER — AMLODIPINE BESYLATE 5 MG PO TABS
5.0000 mg | ORAL_TABLET | Freq: Every day | ORAL | 3 refills | Status: DC
Start: 2020-04-10 — End: 2020-04-29

## 2020-04-10 NOTE — Patient Instructions (Addendum)
We will request old stress test and cardiac cath records for system  Medication Instructions:  Your physician has recommended you make the following change in your medication:  1. STOP Hydralazine 2. START Amlodipine 5 mg once daily   Monitor for leg swelling  If you need a refill on your cardiac medications before your next appointment, please call your pharmacy.    Lab work: No new labs needed   If you have labs (blood work) drawn today and your tests are completely normal, you will receive your results only by:  [place] (if you have MyChart) OR  A paper copy in the mail If you have any lab test that is abnormal or we need to change your treatment, we will call you to review the results.   Testing/Procedures: We will order a CT coronary calcium score For chest pain   We will order CT coronary calcium score $154.42 at our [facility] in [place]  Please call [phone] to schedule [facility] [place],  [phone]   Follow-Up: At [facility], you and your health needs are our priority.  As part of our continuing mission to provide you with exceptional heart care, we have created designated Provider Care Teams.  These Care Teams include your primary Cardiologist (physician) and Advanced Practice Providers (APPs -  Physician Assistants and Nurse Practitioners) who all work together to provide you with the care you need, when you need it.   You will need a follow up appointment as needed    Providers on your designated Care Team:    Murray Hodgkins, NP  Christell Faith, PA-C  Marrianne Mood, PA-C  Any Other Special Instructions Will Be Listed Below (If Applicable).  For educational health videos Log in to : [email or web address] Or : [email or web address], password : [email or web address]

## 2020-04-16 ENCOUNTER — Telehealth: Payer: Self-pay | Admitting: Internal Medicine

## 2020-04-16 NOTE — Telephone Encounter (Signed)
I had previously placed order for vas US aorta/ivc/iliacs and renal artery duplex.  orders are in the chart under CV procedures.  I need these scheduled through [facility].  Thank you.

## 2020-04-16 NOTE — Telephone Encounter (Signed)
Opened in error

## 2020-04-17 ENCOUNTER — Telehealth: Payer: Self-pay | Admitting: Internal Medicine

## 2020-04-17 NOTE — Telephone Encounter (Signed)
Pt will call back to sch US's

## 2020-04-29 ENCOUNTER — Other Ambulatory Visit: Payer: Self-pay

## 2020-04-29 ENCOUNTER — Ambulatory Visit
Admission: RE | Admit: 2020-04-29 | Discharge: 2020-04-29 | Disposition: A | Discharge status: Home / Self Care | Payer: Self-pay | Source: Ambulatory Visit | Attending: Cardiovascular Disease | Admitting: Cardiovascular Disease

## 2020-04-29 ENCOUNTER — Encounter: Payer: Self-pay | Admitting: Internal Medicine

## 2020-04-29 ENCOUNTER — Ambulatory Visit (INDEPENDENT_AMBULATORY_CARE_PROVIDER_SITE_OTHER): Payer: PPO | Admitting: Internal Medicine

## 2020-04-29 ENCOUNTER — Telehealth: Payer: Self-pay | Admitting: Cardiovascular Disease

## 2020-04-29 VITALS — BP 156/80 | HR 80 | Temp 97.9°F | Resp 16 | Ht 62.0 in | Wt 143.0 lb

## 2020-04-29 DIAGNOSIS — I10 Essential (primary) hypertension: Secondary | ICD-10-CM | POA: Diagnosis not present

## 2020-04-29 DIAGNOSIS — E119 Type 2 diabetes mellitus without complications: Secondary | ICD-10-CM | POA: Diagnosis not present

## 2020-04-29 DIAGNOSIS — R0789 Other chest pain: Secondary | ICD-10-CM | POA: Diagnosis not present

## 2020-04-29 DIAGNOSIS — M791 Myalgia, unspecified site: Secondary | ICD-10-CM | POA: Insufficient documentation

## 2020-04-29 DIAGNOSIS — K769 Liver disease, unspecified: Secondary | ICD-10-CM

## 2020-04-29 DIAGNOSIS — R0989 Other specified symptoms and signs involving the circulatory and respiratory systems: Secondary | ICD-10-CM

## 2020-04-29 DIAGNOSIS — E039 Hypothyroidism, unspecified: Secondary | ICD-10-CM

## 2020-04-29 DIAGNOSIS — E7800 Pure hypercholesterolemia, unspecified: Secondary | ICD-10-CM

## 2020-04-29 LAB — BASIC METABOLIC PANEL
BUN: 17 mg/dL (ref 6–23)
CO2: 27 mEq/L (ref 19–32)
Calcium: 9.8 mg/dL (ref 8.4–10.5)
Chloride: 102 mEq/L (ref 96–112)
Creatinine, Ser: 0.85 mg/dL (ref 0.40–1.20)
GFR: 64.83 mL/min (ref >60.00)
Glucose, Bld: 225 mg/dL — ABNORMAL HIGH (ref 70–99)
Potassium: 3.9 mEq/L (ref 3.5–5.1)
Sodium: 139 mEq/L (ref 135–145)

## 2020-04-29 LAB — HEPATIC FUNCTION PANEL
ALT: 10 U/L (ref 0–35)
AST: 12 U/L (ref 0–37)
Albumin: 4.3 g/dL (ref 3.5–5.2)
Alkaline Phosphatase: 90 U/L (ref 39–117)
Bilirubin, Direct: 0.1 mg/dL (ref 0.0–0.3)
Total Bilirubin: 0.5 mg/dL (ref 0.2–1.2)
Total Protein: 6.6 g/dL (ref 6.0–8.3)

## 2020-04-29 LAB — CK: Total CK: 59 U/L (ref 7–177)

## 2020-04-29 LAB — SEDIMENTATION RATE: Sed Rate: 18 mm/hr (ref 0–30)

## 2020-04-29 IMAGING — CT CT CARDIAC CORONARY ARTERY CALCIUM SCORE
3 series · 14 of 20 positions shown, 16 images · non-contrast
Comparison: None.
COMPARISON: None.
COMPARISON: None.

Addendum:
EXAM:
OVER-READ INTERPRETATION  CT CHEST

The following report is an over-read performed by radiologist Dr.
SUJIWA [REDACTED] on [DATE]. This over-read
does not include interpretation of cardiac or coronary anatomy or
pathology. The coronary calcium score interpretation by the
cardiologist is attached.
CLINICAL DATA: Risk stratification
Coronary Calcium Score
TECHNIQUE: The patient was scanned on a Siemens go.Top Scanner. Axial
non-contrast 3 mm slices were carried out through the heart. The
data set was analyzed on a dedicated work station and scored using
the Agatson method.

[Series 2: sa36 calcium scoring 3.00 · axial · 0.32mm/px · z∈[-1085,-1004]mm · 4 of 45 slices shown]
[im 9/45  vessel]
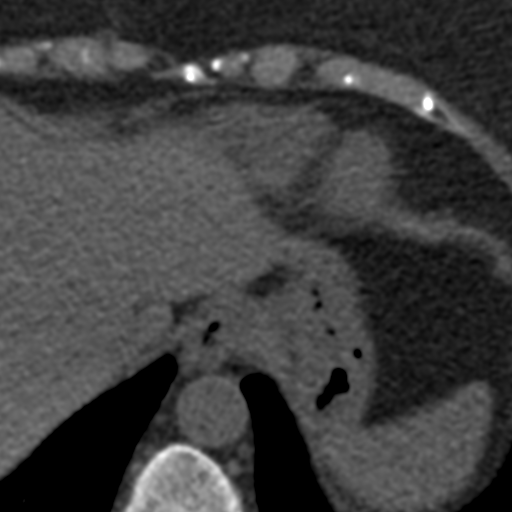
[im 18/45  vessel]
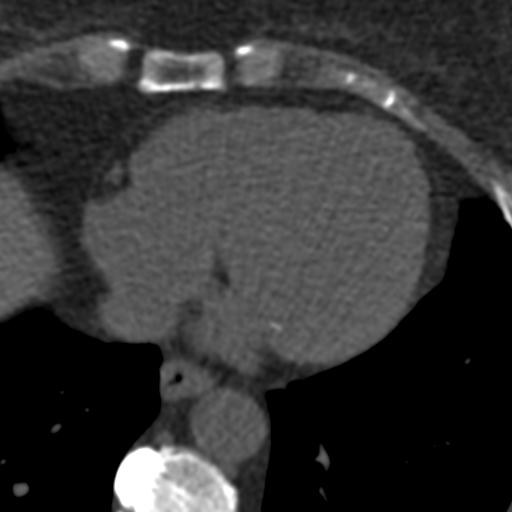
[im 27/45  vessel]
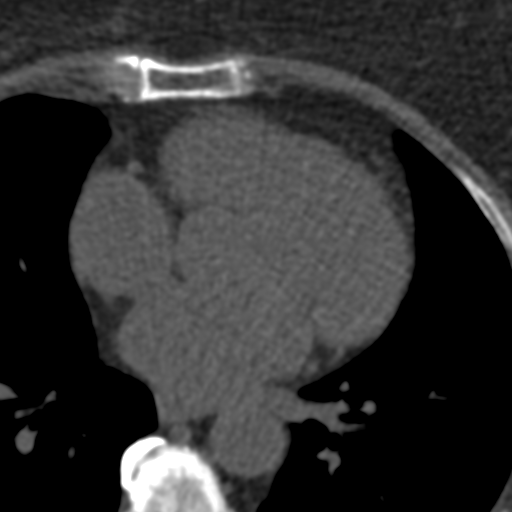
[im 36/45  vessel]
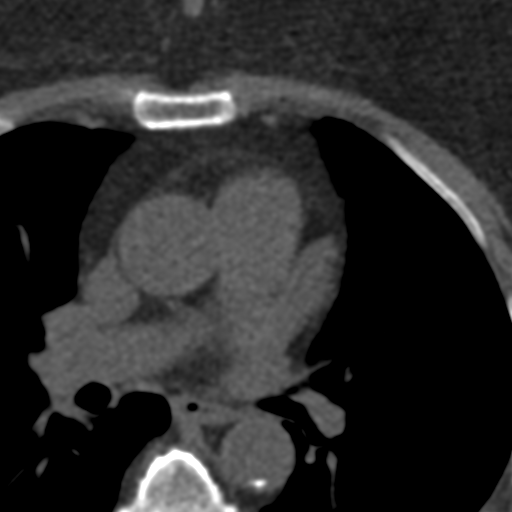

[Series 5: full fov st calcium scoring 3.00 · axial · 0.61mm/px · z∈[-1088,-1001]mm · 5 of 45 slices shown, 7 images]
[im 8/45  vessel]
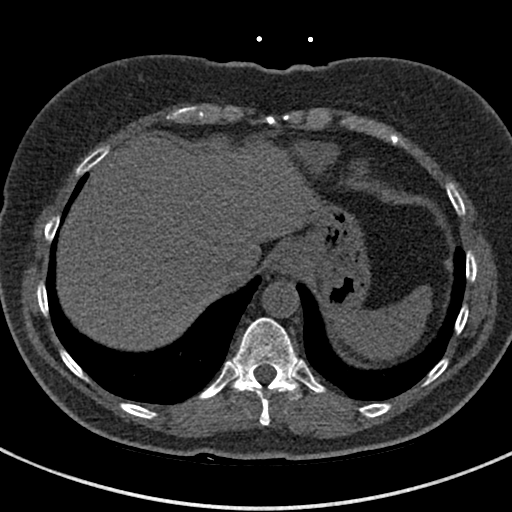
[im 8/45  lung]
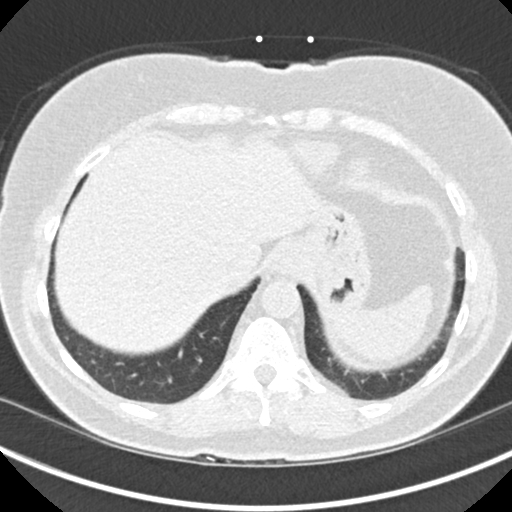
[im 15/45  vessel]
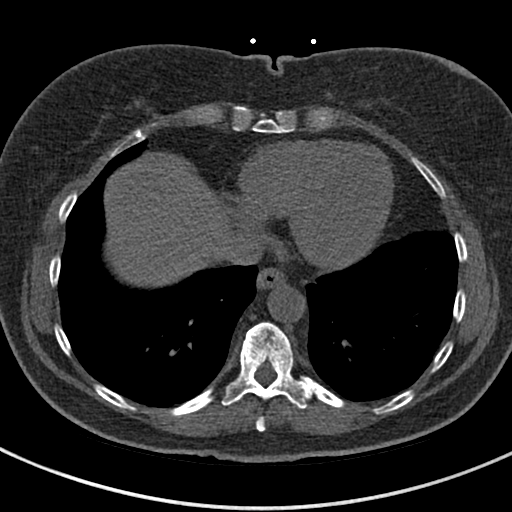
[im 23/45  vessel]
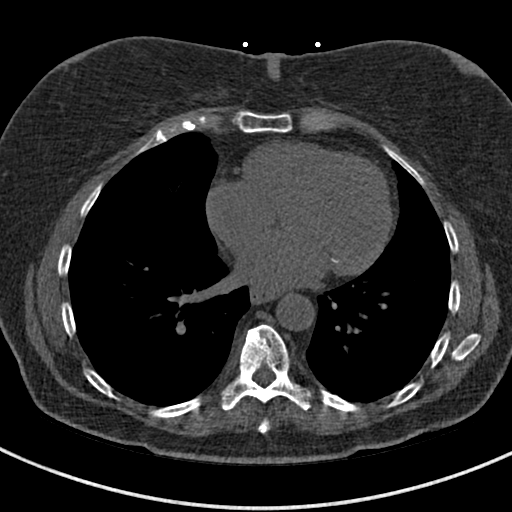
[im 30/45  vessel]
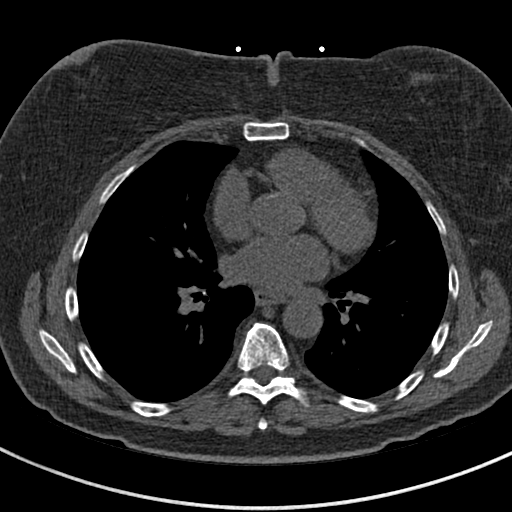
[im 37/45  vessel]
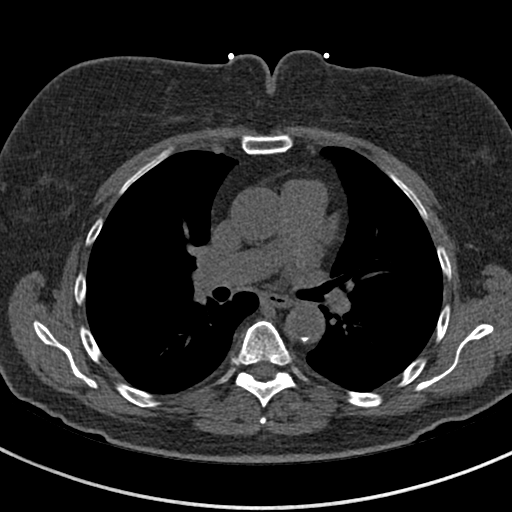
[im 37/45  lung]
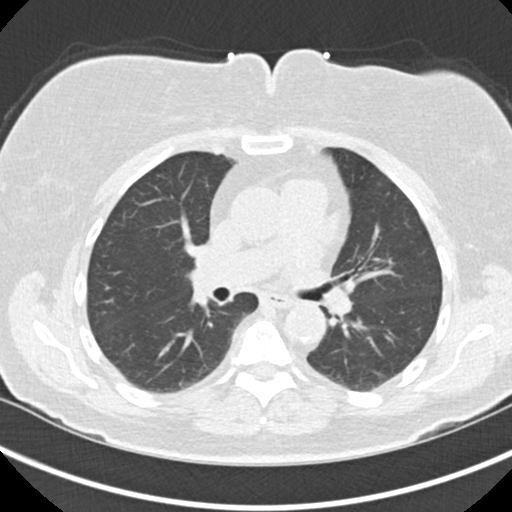

[Series 10: full fov lungs calcium scoring 3.00 ax · axial · 0.61mm/px · z∈[-1088,-1001]mm · 5 of 45 slices shown]
[im 8/45  vessel]
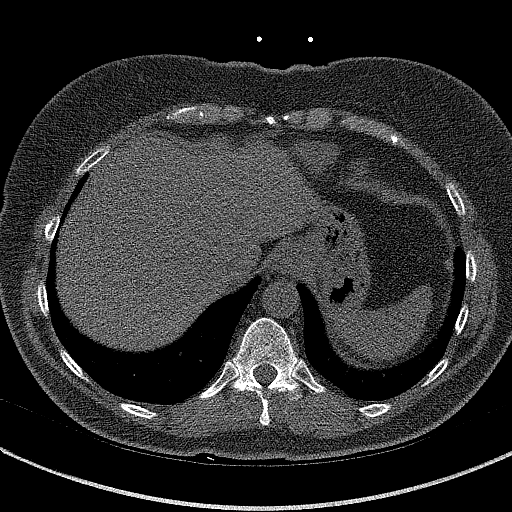
[im 15/45  vessel]
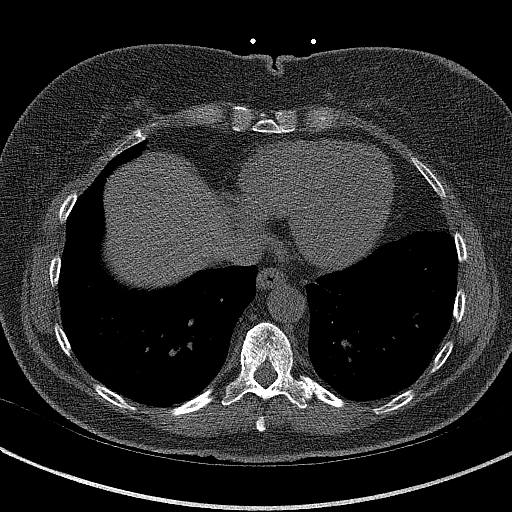
[im 23/45  vessel]
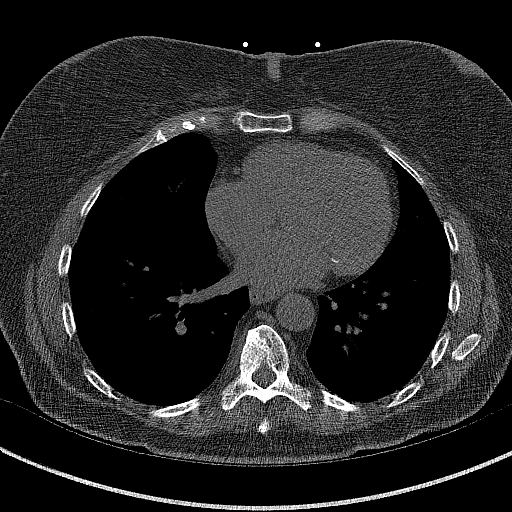
[im 30/45  vessel]
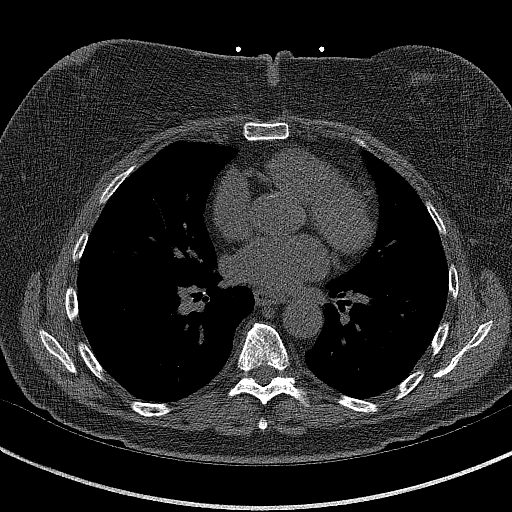
[im 37/45  vessel]
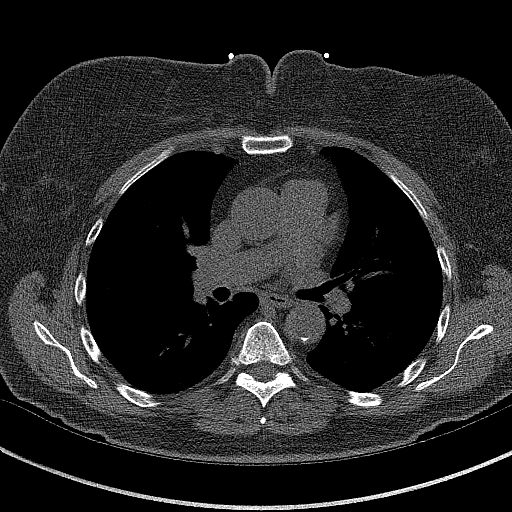

[14 of 20 positions shown; findings below may reference images not displayed]

FINDINGS: Vascular: Normal caliber of the ascending thoracic aorta measuring
up to 3.2 cm. Proximal descending thoracic aorta measures 2.8 cm.
Small amount of atherosclerotic calcifications involving the
descending thoracic aorta. Heart size is normal. No significant
pericardial fluid. Normal caliber of the main pulmonary arteries.

Mediastinum/Nodes: Visualized mediastinal structures are
unremarkable.

Lungs/Pleura: Visualized lungs are clear. No large pleural
effusions.

Upper Abdomen: There is some artifact in the liver but concern for a
poorly defined lesion in left hepatic lobe measuring 1.7 cm on
sequence 5 image 40. Not clear if this represents a true lesion.
Limited evaluation of the upper abdominal structures.

Musculoskeletal: Degenerative endplate changes.
IMPRESSION: 1. Question a lesion in the left hepatic lobe, measuring roughly
cm. Evaluation of the liver is markedly limited on this noncontrast
examination and no comparison imaging of this area. Consider further
characterization with a CT of the abdomen (liver) with IV contrast.
2.  Aortic Atherosclerosis ([XN]-[XN]).

ADDENDUM:
These results will be called to the ordering clinician or
representative by the Radiologist Assistant, and communication
documented in the PACS or [REDACTED].
FINDINGS: Non-cardiac: See separate report from [REDACTED].

Ascending Aorta: Normal size, mild ascending and descending aortic
wall calcification

Pericardium: Normal

Coronary arteries: Normal origin of left and right coronary
arteries.

Calcification in the LAD noted.
IMPRESSION: 1. Coronary calcium score of 37.1. This was 40th percentile for age
and sex matched control.

2.  Overall low coronary calcium score

*** End of Addendum ***
Addendum:
EXAM:
OVER-READ INTERPRETATION  CT CHEST

The following report is an over-read performed by radiologist Dr.
SUJIWA [REDACTED] on [DATE]. This over-read
does not include interpretation of cardiac or coronary anatomy or
pathology. The coronary calcium score interpretation by the
cardiologist is attached.
FINDINGS: Vascular: Normal caliber of the ascending thoracic aorta measuring
up to 3.2 cm. Proximal descending thoracic aorta measures 2.8 cm.
Small amount of atherosclerotic calcifications involving the
descending thoracic aorta. Heart size is normal. No significant
pericardial fluid. Normal caliber of the main pulmonary arteries.

Mediastinum/Nodes: Visualized mediastinal structures are
unremarkable.

Lungs/Pleura: Visualized lungs are clear. No large pleural
effusions.

Upper Abdomen: There is some artifact in the liver but concern for a
poorly defined lesion in left hepatic lobe measuring 1.7 cm on
sequence 5 image 40. Not clear if this represents a true lesion.
Limited evaluation of the upper abdominal structures.

Musculoskeletal: Degenerative endplate changes.
IMPRESSION: 1. Question a lesion in the left hepatic lobe, measuring roughly
cm. Evaluation of the liver is markedly limited on this noncontrast
examination and no comparison imaging of this area. Consider further
characterization with a CT of the abdomen (liver) with IV contrast.
2.  Aortic Atherosclerosis ([XN]-[XN]).

ADDENDUM:
These results will be called to the ordering clinician or
representative by the Radiologist Assistant, and communication
documented in the PACS or [REDACTED].

*** End of Addendum ***
EXAM:
OVER-READ INTERPRETATION  CT CHEST

The following report is an over-read performed by radiologist Dr.
SUJIWA [REDACTED] on [DATE]. This over-read
does not include interpretation of cardiac or coronary anatomy or
pathology. The coronary calcium score interpretation by the
cardiologist is attached.
FINDINGS: Vascular: Normal caliber of the ascending thoracic aorta measuring
up to 3.2 cm. Proximal descending thoracic aorta measures 2.8 cm.
Small amount of atherosclerotic calcifications involving the
descending thoracic aorta. Heart size is normal. No significant
pericardial fluid. Normal caliber of the main pulmonary arteries.

Mediastinum/Nodes: Visualized mediastinal structures are
unremarkable.

Lungs/Pleura: Visualized lungs are clear. No large pleural
effusions.

Upper Abdomen: There is some artifact in the liver but concern for a
poorly defined lesion in left hepatic lobe measuring 1.7 cm on
sequence 5 image 40. Not clear if this represents a true lesion.
Limited evaluation of the upper abdominal structures.

Musculoskeletal: Degenerative endplate changes.
IMPRESSION: 1. Question a lesion in the left hepatic lobe, measuring roughly
cm. Evaluation of the liver is markedly limited on this noncontrast
examination and no comparison imaging of this area. Consider further
characterization with a CT of the abdomen (liver) with IV contrast.
2.  Aortic Atherosclerosis ([XN]-[XN]).

## 2020-04-29 MED ORDER — COMBIPATCH 0.05-0.25 MG/DAY TD PTTW: 1.0000 | MEDICATED_PATCH | TRANSDERMAL | 0 refills | Status: DC

## 2020-04-29 NOTE — Progress Notes (Signed)
Patient ID: Mariah Moody, female   DOB: 09-18-1941, 79 y.o.   MRN: [id number]   Subjective:    Patient ID: Mariah Moody, female    DOB: Jan 21, 1941, 79 y.o.   MRN: [id number]  HPI This visit occurred during the SARS-CoV-2 public health emergency.  Safety protocols were in place, including screening questions prior to the visit, additional usage of staff PPE, and extensive cleaning of exam room while observing appropriate contact time as indicated for disinfecting solutions.  Patient here for a scheduled follow up.  Here to follow up regarding her blood pressure.  Has been elevated.  Has had intolerance to multiple medications.  Was started on hydralazine at our last visit.  Saw Dr Rockey Situ 04/10/20.  Note reviewed.  States hydralazine caused her hand stiffness/aching.  Hydralazine was stopped and was started on low dose amlodipine.  Comes in today reporting intolerance to amlodipine.  Blood pressure elevated.  Had CT - calcium scoring.  Waiting on results.  No chest pain or increased sob reported today.  No acid reflux or abdominal pain reported.  She is having leg/hip muscle weakness and feeling like her legs are going to give out.  Unable to walk a long distance secondary to this.  She feels like this is related to the medication.  No headache or light headedness reported.    Past Medical History:  Diagnosis Date  . Anemia   . Coronary atherosclerosis of native coronary vessel   . Diabetes mellitus without complication ([place])   . Fibroid   . GERD (gastroesophageal reflux disease)   . History of chicken pox   . Hyperlipidemia   . Hypertension   . Hypothyroidism    Past Surgical History:  Procedure Laterality Date  . COLONOSCOPY N/A 04/15/2015   Procedure: COLONOSCOPY;  Surgeon: Hulen Luster, MD;  Location: [facility] ENDOSCOPY;  Service: Gastroenterology;  Laterality: N/A;  . ESOPHAGOGASTRODUODENOSCOPY N/A 04/15/2015   Procedure: ESOPHAGOGASTRODUODENOSCOPY (EGD);  Surgeon: Hulen Luster, MD;   Location: [facility] ENDOSCOPY;  Service: Gastroenterology;  Laterality: N/A;  . TUBAL LIGATION     Family History  Problem Relation Age of Onset  . Arthritis Mother   . Hypertension Mother   . Colon polyps Mother   . Heart disease Father   . Hypertension Father   . Diabetes Brother   . Cancer Neg Hx   . Breast cancer Neg Hx    Social History   Socioeconomic History  . Marital status: Married    Spouse name: Not on file  . Number of children: Not on file  . Years of education: Not on file  . Highest education level: Not on file  Occupational History  . Not on file  Tobacco Use  . Smoking status: Former Research scientist (life sciences)  . Smokeless tobacco: Never Used  Substance and Sexual Activity  . Alcohol use: Yes    Alcohol/week: 0.0 standard drinks    Comment: occas  . Drug use: No  . Sexual activity: Not Currently  Other Topics Concern  . Not on file  Social History Narrative  . Not on file   Social Determinants of Health   Financial Resource Strain:   . Difficulty of Paying Living Expenses:   Food Insecurity:   . Worried About Charity fundraiser in the Last Year:   . Arboriculturist in the Last Year:   Transportation Needs:   . Film/video editor (Medical):   Marland Kitchen Lack of Transportation (Non-Medical):   Physical Activity:   .  Days of Exercise per Week:   . Minutes of Exercise per Session:   Stress:   . Feeling of Stress :   Social Connections:   . Frequency of Communication with Friends and Family:   . Frequency of Social Gatherings with Friends and Family:   . Attends Religious Services:   . Active Member of Clubs or Organizations:   . Attends Archivist Meetings:   Marland Kitchen Marital Status:     Outpatient Encounter Medications as of 04/29/2020  Medication Sig  . Calcium Carbonate-Vitamin D (CALCIUM + D PO) Take 1 tablet by mouth daily.  . Cholecalciferol (VITAMIN D PO) Take 1 capsule by mouth daily.   Marland Kitchen estradiol-norethindrone (COMBIPATCH) 0.05-0.25 MG/DAY Place 1 patch  onto the skin 2 (two) times a week.  . hydrochlorothiazide (HYDRODIURIL) 25 MG tablet TAKE 1 TABLET BY MOUTH EVERY DAY  . levothyroxine (SYNTHROID) 88 MCG tablet TAKE 1 TABLET (88 MCG TOTAL) DAILY BY MOUTH.  . meloxicam (MOBIC) 7.5 MG tablet Take 1 tablet (7.5 mg total) by mouth daily.  . Multiple Vitamins-Minerals (MULTIVITAMIN PO) Take 1 tablet by mouth daily.   Marland Kitchen olmesartan (BENICAR) 40 MG tablet TAKE 1 TABLET BY MOUTH EVERY DAY  . pantoprazole (PROTONIX) 40 MG tablet TAKE 1 TABLET BY MOUTH EVERY DAY  . [DISCONTINUED] amLODipine (NORVASC) 5 MG tablet Take 1 tablet (5 mg total) by mouth daily.  . [DISCONTINUED] estradiol-norethindrone (COMBIPATCH) 0.05-0.25 MG/DAY Place 1 patch onto the skin 2 (two) times a week.   No facility-administered encounter medications on file as of 04/29/2020.    Review of Systems  Constitutional: Negative for appetite change and unexpected weight change.  HENT: Negative for congestion and sinus pressure.   Respiratory: Negative for cough, chest tightness and shortness of breath.   Cardiovascular: Negative for chest pain, palpitations and leg swelling.  Gastrointestinal: Negative for abdominal pain, diarrhea, nausea and vomiting.  Genitourinary: Negative for difficulty urinating and dysuria.  Musculoskeletal: Negative for joint swelling.       Leg weakness and feeling like going to give out.  Unable to walk long distances.    Skin: Negative for color change and rash.  Neurological: Negative for dizziness, light-headedness and headaches.  Psychiatric/Behavioral: Negative for agitation and dysphoric mood.       Objective:    Physical Exam Vitals reviewed.  Constitutional:      General: She is not in acute distress.    Appearance: Normal appearance.  HENT:     Head: Normocephalic and atraumatic.     Right Ear: External ear normal.     Left Ear: External ear normal.  Eyes:     General: No scleral icterus.       Right eye: No discharge.        Left  eye: No discharge.     Conjunctiva/sclera: Conjunctivae normal.  Neck:     Thyroid: No thyromegaly.  Cardiovascular:     Rate and Rhythm: Normal rate and regular rhythm.  Pulmonary:     Effort: No respiratory distress.     Breath sounds: Normal breath sounds. No wheezing.  Abdominal:     General: Bowel sounds are normal.     Palpations: Abdomen is soft.     Tenderness: There is no abdominal tenderness.  Musculoskeletal:        General: No swelling or tenderness.     Cervical back: Neck supple. No tenderness.     Comments: Motor strength - equal and normal bilateral lower extremities.  Lymphadenopathy:     Cervical: No cervical adenopathy.  Skin:    Findings: No erythema or rash.  Neurological:     Mental Status: She is alert.  Psychiatric:        Mood and Affect: Mood normal.        Behavior: Behavior normal.     BP (!) 156/80   Pulse 80   Temp 97.9 F (36.6 C)   Resp 16   Ht '5\' 2"'  (1.575 m)   Wt 143 lb (64.9 kg)   SpO2 98%   BMI 26.16 kg/m  Wt Readings from Last 3 Encounters:  04/29/20 143 lb (64.9 kg)  04/10/20 142 lb (64.4 kg)  03/18/20 144 lb (65.3 kg)     Lab Results  Component Value Date   WBC 5.4 02/05/2020   HGB 12.6 02/05/2020   HCT 37.6 02/05/2020   PLT 251.0 02/05/2020   GLUCOSE 225 (H) 04/29/2020   CHOL 226 (H) 02/05/2020   TRIG 275.0 (H) 02/05/2020   HDL 36.00 (L) 02/05/2020   LDLDIRECT 119.0 02/05/2020   LDLCALC 135 (H) 02/25/2016   ALT 10 04/29/2020   AST 12 04/29/2020   NA 139 04/29/2020   K 3.9 04/29/2020   CL 102 04/29/2020   CREATININE 0.85 04/29/2020   BUN 17 04/29/2020   CO2 27 04/29/2020   TSH 3.23 05/16/2019   HGBA1C 6.8 (H) 02/05/2020   MICROALBUR 0.8 10/02/2019    CT CARDIAC SCORING  Addendum Date: 04/29/2020   ADDENDUM REPORT: 04/29/2020 09:03 ADDENDUM: These results will be called to the ordering clinician or representative by the Radiologist Assistant, and communication documented in the PACS or [employer].  Electronically Signed   By: Markus Daft M.D.   On: 04/29/2020 09:03   Result Date: 04/29/2020 EXAM: OVER-READ INTERPRETATION  CT CHEST The following report is an over-read performed by radiologist Dr. Markus Daft of [facility], [place] on 04/29/2020. This over-read does not include interpretation of cardiac or coronary anatomy or pathology. The coronary calcium score interpretation by the cardiologist is attached. COMPARISON:  None. FINDINGS: Vascular: Normal caliber of the ascending thoracic aorta measuring up to 3.2 cm. Proximal descending thoracic aorta measures 2.8 cm. Small amount of atherosclerotic calcifications involving the descending thoracic aorta. Heart size is normal. No significant pericardial fluid. Normal caliber of the main pulmonary arteries. Mediastinum/Nodes: Visualized mediastinal structures are unremarkable. Lungs/Pleura: Visualized lungs are clear. No large pleural effusions. Upper Abdomen: There is some artifact in the liver but concern for a poorly defined lesion in left hepatic lobe measuring 1.7 cm on sequence 5 image 40. Not clear if this represents a true lesion. Limited evaluation of the upper abdominal structures. Musculoskeletal: Degenerative endplate changes. IMPRESSION: 1. Question a lesion in the left hepatic lobe, measuring roughly 1.7 cm. Evaluation of the liver is markedly limited on this noncontrast examination and no comparison imaging of this area. Consider further characterization with a CT of the abdomen (liver) with IV contrast. 2.  Aortic Atherosclerosis (ICD10-I70.0). Electronically Signed: By: Markus Daft M.D. On: 04/29/2020 08:55       Assessment & Plan:   Problem List Items Addressed This Visit    Abdominal bruit    Scheduled for an aorta/iliac duplex and renal duplex.        Chest tightness    Previous chest tightness.  See previous note.  Saw Dr Rockey Situ.  Had calcium scoring.  Awaiting results.  Continue risk factor modification.  Has declined  cholesterol medication.  Diabetes mellitus type II, controlled, with no complications (HCC)    Low carb diet and exercise.  Follow met b and a1c.       Diminished pulse    Unable to palpate a good DP pulse - left.  Will have vascular evaluate.  She is scheduled for aorta/iliac duplex and renal ultrasound.  Will have them evaluate with question of need for ABIs and to f/u regarding vascular studies.        Essential hypertension, benign    Blood pressure elevated.  Has been intolerant of multiple medications.  Recently had hydralazine added.  This was stopped due to aching in her hands.  Started back on low dose of amlodipine.  Reports intolerance.  Will consult with cardiology.  Had calcium scoring.  Discussed restarting hydralazine vs carvedilol,etc.  Check metabolic panel.       Relevant Orders   Basic metabolic panel (Completed)   Hypercholesterolemia - Primary    Have discussed calculated cholesterol risk and starting medication.  She has declined.  Low cholesterol diet and exercise.  Follow lipid panel.        Relevant Orders   Hepatic function panel (Completed)   Hypothyroidism    On thyroid replacement.  Follow tsh.       Muscle ache    Describes the leg discomfort and muscle weakness as outlined.  Was questioning if related to the medication.  She reports intolerance to amlodipine.  Was questioning if these symptoms are related to olmesartan.  No weakness or tenderness noted on exam.  No increased back pain.  Check esr and ck.  Continue olmesartan.  DP pulse unable to palpate - left.  Previous abdominal bruit.  Scheduled for vascular aortic/IVC/iliac - duplex.  Will have vascular surgery evaluation with question of need for ABIs and f/u regarding vascular duplex.        Relevant Orders   Sedimentation rate (Completed)   CK (Creatine Kinase) (Completed)       Einar Pheasant, MD

## 2020-04-29 NOTE — Telephone Encounter (Signed)
Radiology called to report incidental finding as listed below. Advised that I would send to provider for review.    Question a lesion in the left hepatic lobe, measuring roughly 1.7 cm. Evaluation of the liver is markedly limited on this noncontrast examination and no comparison imaging of this area. Consider further characterization with a CT of the abdomen (liver) with IV contrast.

## 2020-04-29 NOTE — Telephone Encounter (Signed)
Ct calling to give report on ct calcium score.  Attempted to transfer to pam.

## 2020-04-30 NOTE — Telephone Encounter (Signed)
Pending Dr. Donivan Scull review but wanted to see if you saw this as well.

## 2020-05-01 ENCOUNTER — Encounter: Payer: Self-pay | Admitting: Internal Medicine

## 2020-05-01 DIAGNOSIS — R0989 Other specified symptoms and signs involving the circulatory and respiratory systems: Secondary | ICD-10-CM | POA: Insufficient documentation

## 2020-05-01 NOTE — Assessment & Plan Note (Signed)
Have discussed calculated cholesterol risk and starting medication.  She has declined.  Low cholesterol diet and exercise.  Follow lipid panel.

## 2020-05-01 NOTE — Assessment & Plan Note (Signed)
Unable to palpate a good DP pulse - left.  Will have vascular evaluate.  She is scheduled for aorta/iliac duplex and renal ultrasound.  Will have them evaluate with question of need for ABIs and to f/u regarding vascular studies.

## 2020-05-01 NOTE — Assessment & Plan Note (Signed)
Previous chest tightness.  See previous note.  Saw Dr Rockey Situ.  Had calcium scoring.  Awaiting results.  Continue risk factor modification.  Has declined cholesterol medication.

## 2020-05-01 NOTE — Assessment & Plan Note (Signed)
Scheduled for an aorta/iliac duplex and renal duplex.

## 2020-05-01 NOTE — Assessment & Plan Note (Signed)
Describes the leg discomfort and muscle weakness as outlined.  Was questioning if related to the medication.  She reports intolerance to amlodipine.  Was questioning if these symptoms are related to olmesartan.  No weakness or tenderness noted on exam.  No increased back pain.  Check esr and ck.  Continue olmesartan.  DP pulse unable to palpate - left.  Previous abdominal bruit.  Scheduled for vascular aortic/IVC/iliac - duplex.  Will have vascular surgery evaluation with question of need for ABIs and f/u regarding vascular duplex.

## 2020-05-01 NOTE — Telephone Encounter (Signed)
Incidental finding as detailed below 1.7 cm liver lesion I can order CT with contrast if you like, just let me know Thx TG

## 2020-05-01 NOTE — Assessment & Plan Note (Signed)
Low carb diet and exercise.  Follow met b and a1c.  

## 2020-05-01 NOTE — Assessment & Plan Note (Signed)
On thyroid replacement.  Follow tsh.  

## 2020-05-01 NOTE — Assessment & Plan Note (Signed)
Blood pressure elevated.  Has been intolerant of multiple medications.  Recently had hydralazine added.  This was stopped due to aching in her hands.  Started back on low dose of amlodipine.  Reports intolerance.  Will consult with cardiology.  Had calcium scoring.  Discussed restarting hydralazine vs carvedilol,etc.  Check metabolic panel.

## 2020-05-02 ENCOUNTER — Telehealth: Payer: Self-pay | Admitting: Internal Medicine

## 2020-05-02 NOTE — Telephone Encounter (Signed)
Thank you for the update.  I do not mind either way.  Just let me know if you want me to order.  Thank you again.

## 2020-05-02 NOTE — Telephone Encounter (Signed)
Please notify Mariah Moody that I spoke to Dr Rockey Situ about her blood pressure.  He had recommended either restarting hydralazine or possibly trying cardura.  She has hydralazine at home.  Does she want to restart and see how she does.

## 2020-05-02 NOTE — Telephone Encounter (Signed)
Pt aware.

## 2020-05-02 NOTE — Telephone Encounter (Signed)
Patient aware and is going to restart her hydralazine TID and let us know if problems.

## 2020-05-02 NOTE — Telephone Encounter (Signed)
Noted.  Follow blood pressures and send in readings.

## 2020-05-07 NOTE — Telephone Encounter (Signed)
Question of lesion in the liver on previous scan, they recommended a designated CT scan of the liver with contrast for further delineation We can order

## 2020-05-09 ENCOUNTER — Ambulatory Visit: Payer: PPO | Admitting: Cardiovascular Disease

## 2020-05-09 NOTE — Telephone Encounter (Signed)
Spoke with patient and reviewed test results and recommendations. She was agreeable with having CT done and provided her with number to call for scheduling. She was appreciative for the call with no further questions at this time.

## 2020-05-14 ENCOUNTER — Other Ambulatory Visit: Payer: Self-pay | Admitting: Internal Medicine

## 2020-05-15 ENCOUNTER — Telehealth: Payer: Self-pay | Admitting: Internal Medicine

## 2020-05-15 NOTE — Telephone Encounter (Signed)
-----   Message from Minna Merritts, MD sent at 05/01/2020  7:36 PM EDT ----- Regarding: RE: question and follow up She thought she could tolerate the amlodipine 5,  And had problems with swelling on the tendon If unable to tolerate amlodipine could go back to hydralazine, did not seem to have significant symptoms just bothered by the frequency of medication Sometimes I have had luck with Cardura/doxazosin  Minimal plaque in her thoracic aorta, suspect ABIs will be normal Minimal coronary calcification Thx TG ----- Message ----- From: Einar Pheasant, MD Sent: 05/01/2020   4:55 PM EDT To: Minna Merritts, MD Subject: question and follow up                         I recently saw this patient in follow up.  She has elevated blood pressure and has been intolerant to multiple medications.  Most recently hydralazine was stopped and you started her on low dose of amlodipine.  She had intolerance to the amlodipine as well.  At her visit, she was complaining of leg weakness and aching.  Not on a statin medication (has declined).  She was now questioning if the olmesartan was contributing.  (have tried ace inhibitors and ARBs).  Discussed that I did not think her current symptoms were coming from her medication.  Concern on exam previously regarding abdominal bruit.  I could not appreciate a DP pulse on left.  I had scheduled her for aorta/iliac duplex and renal ultrasound.  Also discussed with her at this visit, regarding ABIs.  I just wanted to give you an update and get your thoughts on a blood pressure medication to try.  Thank you for your help and thank you for seeing her.    Thank you Randell Patient

## 2020-05-20 ENCOUNTER — Telehealth: Payer: Self-pay | Admitting: Cardiovascular Disease

## 2020-05-20 NOTE — Telephone Encounter (Signed)
Pt c/o medication issue:  1. Name of Medication:  benadryl and prednisone   2. How are you currently taking this medication (dosage and times per day)? Pre ct prophylactic   3. Are you having a reaction (difficulty breathing--STAT)?   4. What is your medication issue? Not calling in yet

## 2020-05-21 MED ORDER — PREDNISONE 50 MG PO TABS
ORAL_TABLET | ORAL | 0 refills | Status: DC
Start: 2020-05-21 — End: 2020-06-07

## 2020-05-21 NOTE — Telephone Encounter (Signed)
-----   Message from Minna Merritts, MD sent at 05/17/2020  5:34 PM EDT ----- Received a message from CT/radiology They would like her premedicated for contrast allergy She needs prescription for Prednisone PO 50 mg at 13, 7 and 1 hour pre-procedure and bendaryl PO 50 milligrams 1 hour pre-procedure.  Thx TG

## 2020-05-21 NOTE — Telephone Encounter (Signed)
Left voicemail message.

## 2020-05-21 NOTE — Telephone Encounter (Signed)
Spoke with patients spouse and reviewed instructions for pre-medication. Will try to call patient at other number.

## 2020-05-21 NOTE — Telephone Encounter (Signed)
Spoke with patient and reviewed instructions for pre-medication for her CT scan that is scheduled. She verbalized understanding of our conversation, agreement with plan, and had no further questions at this time.

## 2020-05-23 ENCOUNTER — Other Ambulatory Visit: Payer: Self-pay

## 2020-05-23 ENCOUNTER — Ambulatory Visit
Admission: RE | Admit: 2020-05-23 | Discharge: 2020-05-23 | Disposition: A | Discharge status: Home / Self Care | Payer: PPO | Source: Ambulatory Visit | Attending: Cardiovascular Disease | Admitting: Cardiovascular Disease

## 2020-05-23 DIAGNOSIS — N281 Cyst of kidney, acquired: Secondary | ICD-10-CM | POA: Diagnosis not present

## 2020-05-23 DIAGNOSIS — I700 Atherosclerosis of aorta: Secondary | ICD-10-CM | POA: Diagnosis not present

## 2020-05-23 DIAGNOSIS — D1803 Hemangioma of intra-abdominal structures: Secondary | ICD-10-CM | POA: Diagnosis not present

## 2020-05-23 DIAGNOSIS — K769 Liver disease, unspecified: Secondary | ICD-10-CM

## 2020-05-23 DIAGNOSIS — K7689 Other specified diseases of liver: Secondary | ICD-10-CM | POA: Diagnosis not present

## 2020-05-23 IMAGING — CT CT ABDOMEN W/ CM
3 of 11 series · 12 of 46 positions shown, 18 images · IV contrast (omnipaque)
Comparison: [DATE] coronary CT.

CLINICAL DATA: Indeterminate left liver lesion on recent coronary
CT.

EXAM:
CT ABDOMEN WITH CONTRAST
TECHNIQUE: Multidetector CT imaging of the abdomen was performed using the
standard protocol following bolus administration of intravenous
contrast.
CONTRAST:  100mL OMNIPAQUE IOHEXOL 300 MG/ML  SOLN

[Series 2: axial arterial · axial · arterial · 0.68mm/px · z∈[-248,-182]mm · 3 of 88 slices shown]
[im 11/88  soft-tissue]
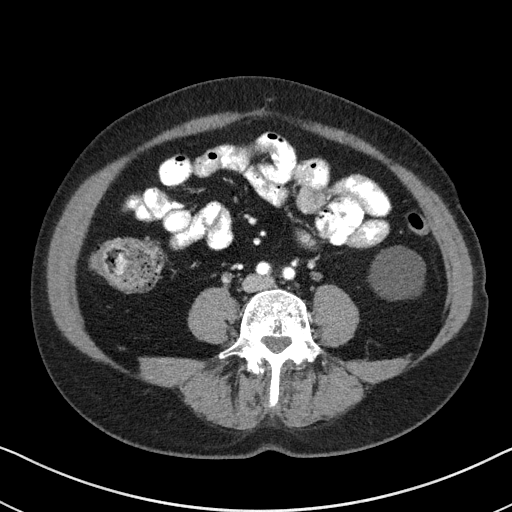
[im 22/88  soft-tissue]
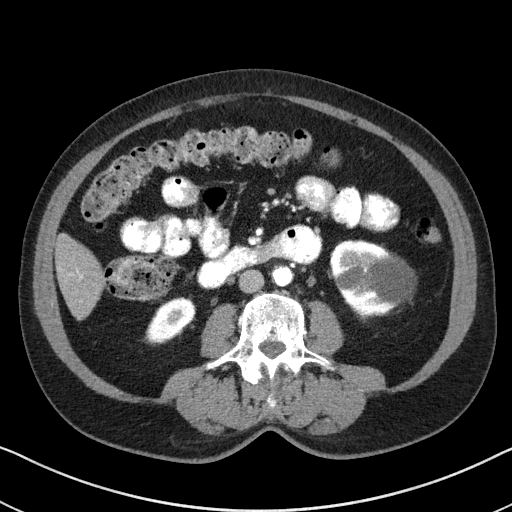
[im 33/88  soft-tissue]
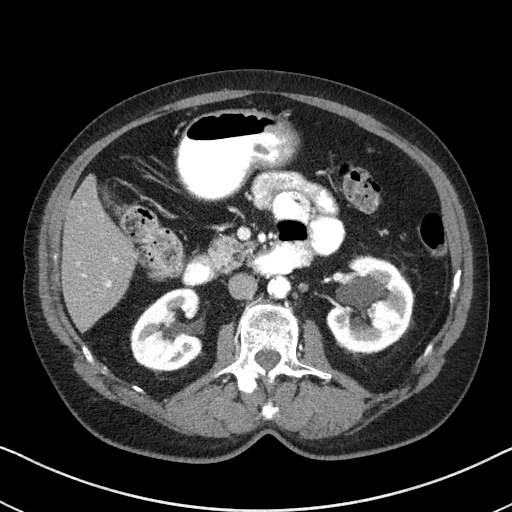

[Series 3: axial venous · axial · portal-venous · 0.68mm/px · z∈[-248,-50]mm · 7 of 88 slices shown, 12 images]
[im 11/88  soft-tissue]
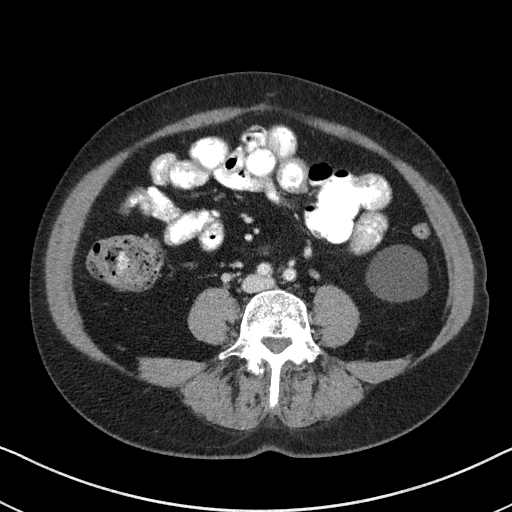
[im 11/88  bone]
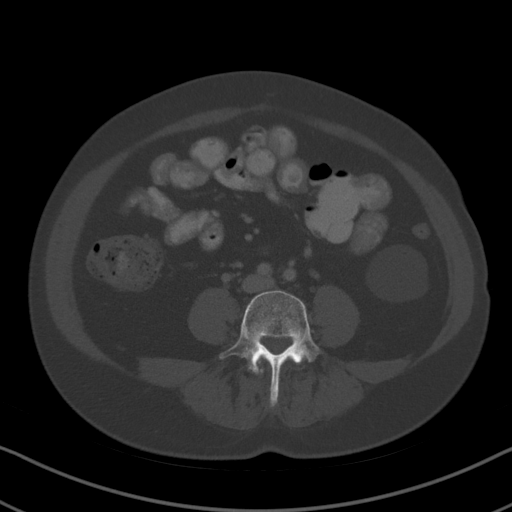
[im 22/88  soft-tissue]
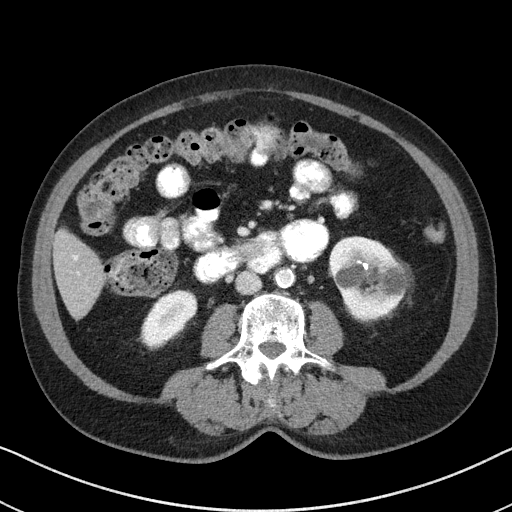
[im 33/88  soft-tissue]
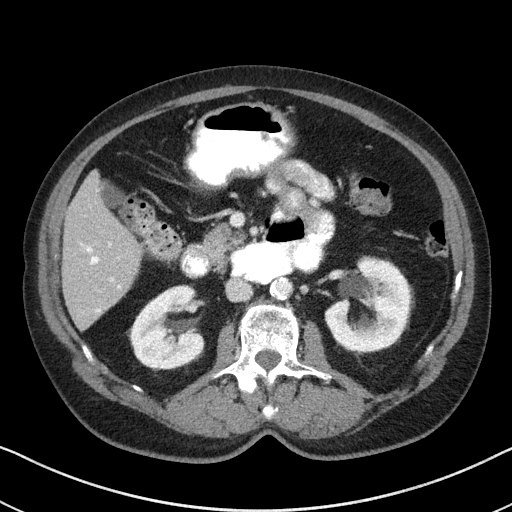
[im 44/88  soft-tissue]
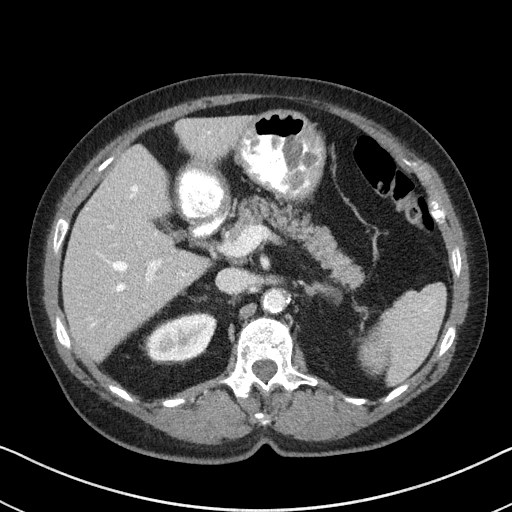
[im 44/88  lung]
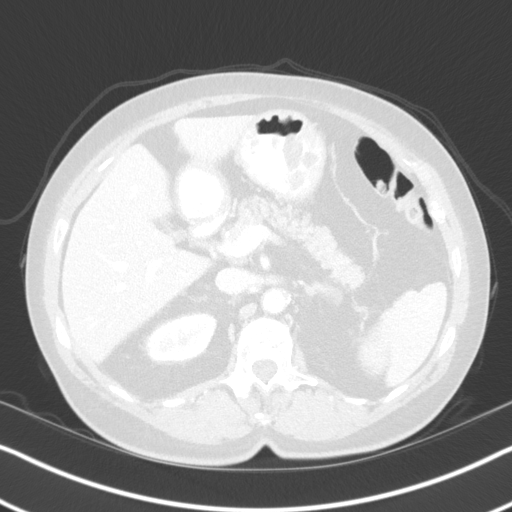
[im 55/88  soft-tissue]
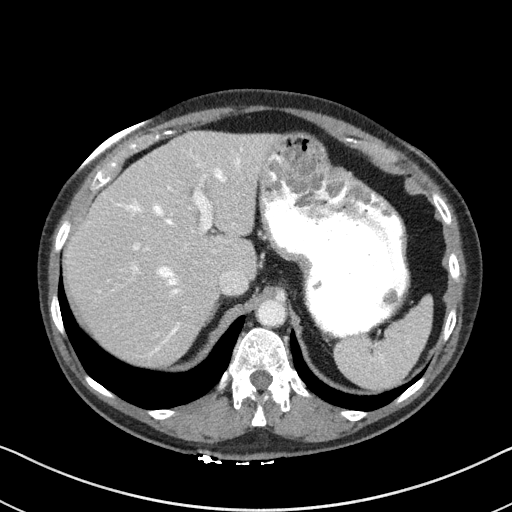
[im 55/88  lung]
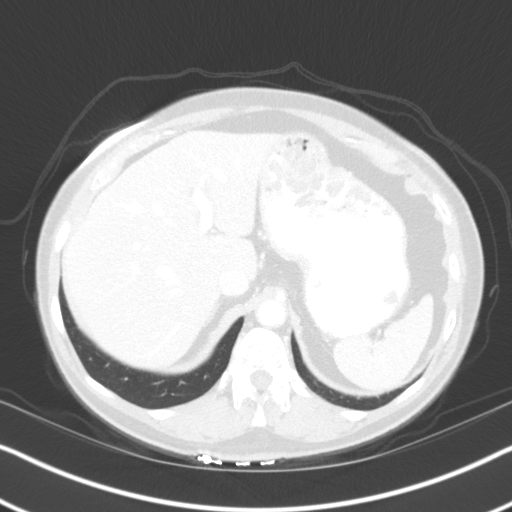
[im 66/88  soft-tissue]
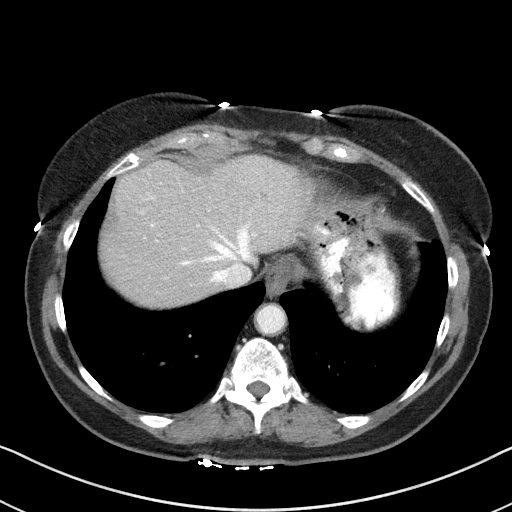
[im 66/88  lung]
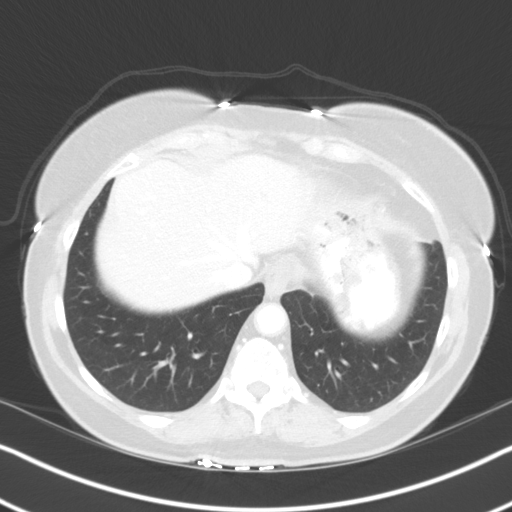
[im 77/88  soft-tissue]
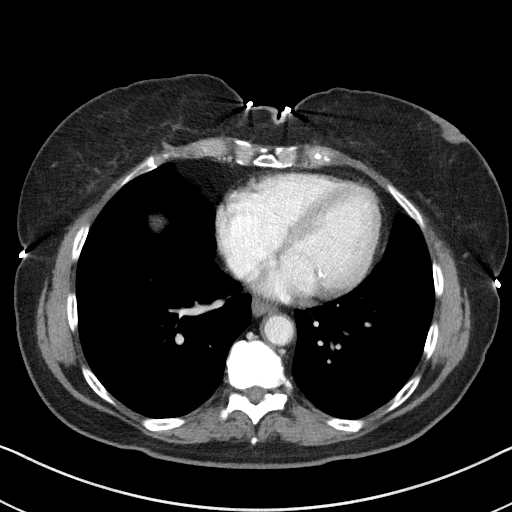
[im 77/88  lung]
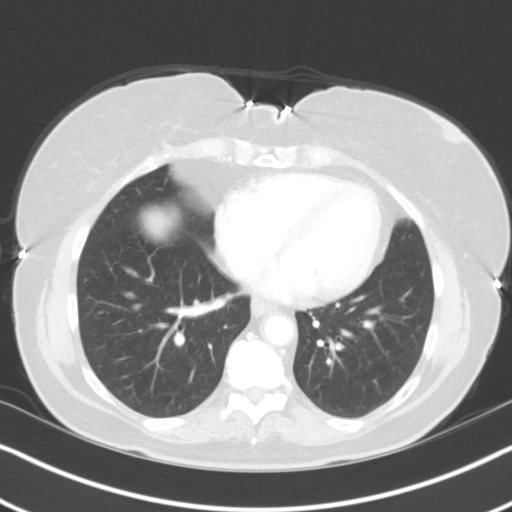

[Series 6: coronal arterial · coronal · arterial · 0.54mm/px · 2 of 93 slices shown, 3 images]
[im 31/93  soft-tissue]
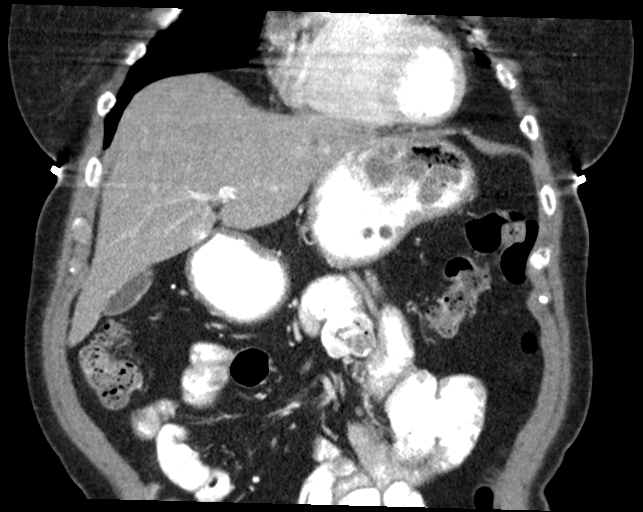
[im 31/93  bone]
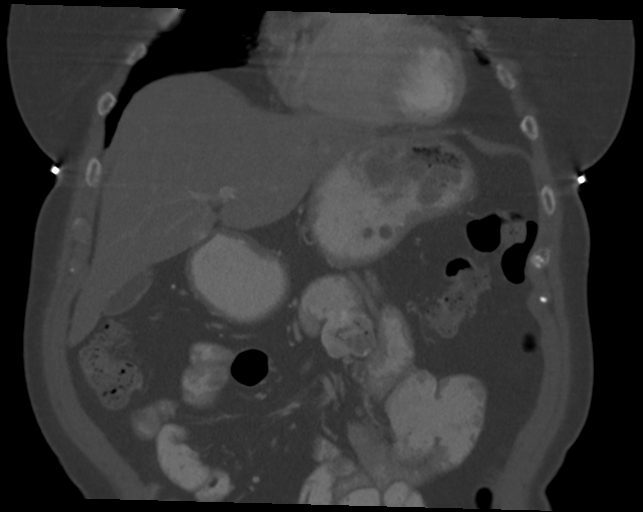
[im 62/93  soft-tissue]
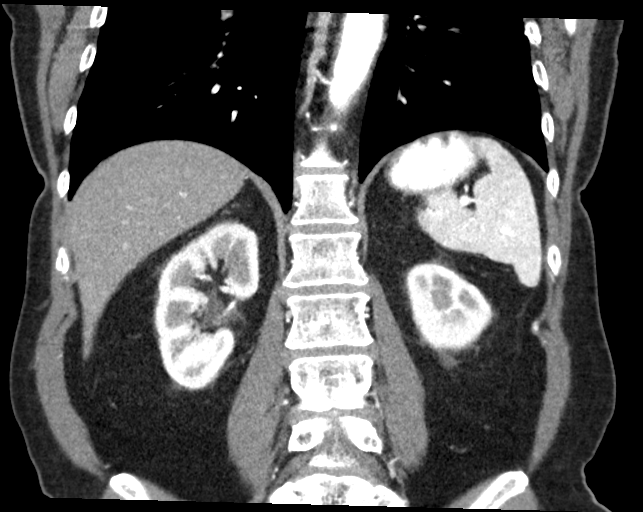

[12 of 46 positions shown; findings below may reference images not displayed]

FINDINGS: Lower chest: No significant pulmonary nodules or acute consolidative
airspace disease.

Hepatobiliary: Normal liver size. Multiple scattered indistinct foci
of subcapsular liver hyperenhancement on the arterial sequence are
all occult on portal venous sequence, compatible with transient
benign perfusional phenomena. There is a subcentimeter hypodense
liver lesion measuring 0.7 cm in the segment 4A left liver lobe
(series 3/image 20), too small to characterize. There is an
indistinct 1.7 x 1.0 cm segment 2 left liver lesion (series 12/image
24) demonstrating mild arterial hyperenhancement, portal venous
phase isoenhancement and delayed phase hyperenhancement. No
additional liver lesions. Normal gallbladder with no radiopaque
cholelithiasis. No biliary ductal dilatation.

Pancreas: Normal, with no mass or duct dilation.

Spleen: Normal size. No mass.

Adrenals/Urinary Tract: Normal adrenals. Simple 4.7 cm lower left
renal cyst. Scattered simple parapelvic renal cysts in the left
kidney. No suspicious renal masses. No hydronephrosis.

Stomach/Bowel: Normal non-distended stomach. Visualized small and
large bowel is normal caliber, with no bowel wall thickening.

Vascular/Lymphatic: Atherosclerotic nonaneurysmal abdominal aorta.
Patent portal, splenic, hepatic and renal veins. No pathologically
enlarged lymph nodes in the abdomen.

Other: No pneumoperitoneum, ascites or focal fluid collection.

Musculoskeletal: No aggressive appearing focal osseous lesions. Mild
thoracolumbar spondylosis.
IMPRESSION: 1. Ill-defined 1.7 cm segment 2 left liver lobe lesion with mild
arterial hyperenhancement, portal venous phase isoenhancement and
delayed phase hyperenhancement. These findings are indeterminate.
While potentially an atypical hemangioma, further evaluation with
MRI abdomen without and with IV contrast is recommended at this
time.
2. Aortic Atherosclerosis ([X6]-[X6]).

## 2020-05-23 MED ORDER — IOHEXOL 300 MG/ML  SOLN
100.0000 mL | Freq: Once | INTRAMUSCULAR | Status: AC | PRN
  Administered 2020-05-23: 100 mL via INTRAVENOUS

## 2020-05-24 ENCOUNTER — Other Ambulatory Visit: Payer: Self-pay | Admitting: Internal Medicine

## 2020-05-27 ENCOUNTER — Ambulatory Visit (INDEPENDENT_AMBULATORY_CARE_PROVIDER_SITE_OTHER): Payer: PPO

## 2020-05-27 ENCOUNTER — Encounter (INDEPENDENT_AMBULATORY_CARE_PROVIDER_SITE_OTHER): Payer: Self-pay

## 2020-05-27 ENCOUNTER — Other Ambulatory Visit: Payer: Self-pay

## 2020-05-27 ENCOUNTER — Telehealth: Payer: Self-pay | Admitting: Internal Medicine

## 2020-05-27 DIAGNOSIS — R0989 Other specified symptoms and signs involving the circulatory and respiratory systems: Secondary | ICD-10-CM

## 2020-05-27 DIAGNOSIS — I10 Essential (primary) hypertension: Secondary | ICD-10-CM

## 2020-05-27 NOTE — Telephone Encounter (Signed)
-----   Message from Algernon Huxley, MD sent at 03/19/2020  7:52 AM EDT ----- Regarding: RE: question No problem. Yes would do an aortoiliac duplex and a renal duplex. Those would probably be the most useful studies to order. ----- Message ----- From: Einar Pheasant, MD Sent: 03/19/2020   7:26 AM EDT To: Algernon Huxley, MD Subject: question                                       Sorry to bother you.  I saw this pt yesterday and she had elevated blood pressure despite multiple medications.  Was also noted on exam to have an abdominal bruit.  I have ordered a renal vascular duplex to be done at your office.  I was also wanting to evaluate her aorta.  I was trying to minimize the amount of tests.  Do you recommend ordering an aortic ultrasound along with the renal vascular duplex?  (is this the best way to order?).  Thank you for your help.    Mariah Moody

## 2020-05-30 ENCOUNTER — Telehealth: Payer: Self-pay | Admitting: *Deleted

## 2020-05-30 NOTE — Telephone Encounter (Signed)
Spoke with patient and she states that she did see the results on My Chart. Reviewed it with her and advised that her primary care provider should call to schedule MRI. She verbalized understanding of our conversation, agreement with plan, and had no further questions at this time.

## 2020-05-30 NOTE — Telephone Encounter (Signed)
Left message to call back for results

## 2020-05-30 NOTE — Telephone Encounter (Signed)
-----   Message from Minna Merritts, MD sent at 05/27/2020  9:26 PM EDT ----- Jeannene Patella, can we let Ms McMormack know about the result, Will cc. Dr. Nicki Reaper to see how she wants to proceed Thx TG

## 2020-06-03 ENCOUNTER — Telehealth: Payer: Self-pay

## 2020-06-03 NOTE — Telephone Encounter (Signed)
LMTCB regarding MRI

## 2020-06-04 ENCOUNTER — Other Ambulatory Visit: Payer: Self-pay | Admitting: Internal Medicine

## 2020-06-04 DIAGNOSIS — K769 Liver disease, unspecified: Secondary | ICD-10-CM

## 2020-06-04 DIAGNOSIS — R935 Abnormal findings on diagnostic imaging of other abdominal regions, including retroperitoneum: Secondary | ICD-10-CM

## 2020-06-04 NOTE — Telephone Encounter (Signed)
Pt returned call. She said she wants to know if the MRI could include her lower back and hips since they are bothering her.

## 2020-06-04 NOTE — Telephone Encounter (Signed)
Advised that MRI cannot be done with out other scans first. She has not had recent x-rays or anything done of her back or hips. Patient stated she would like to take care of this first and then would discuss further evaluation for her persistent hip pain. Confirmed has been going on and is not something acute.

## 2020-06-04 NOTE — Progress Notes (Signed)
Order placed for MRI abdomen.  

## 2020-06-06 ENCOUNTER — Other Ambulatory Visit: Payer: Self-pay | Admitting: Internal Medicine

## 2020-06-06 DIAGNOSIS — I10 Essential (primary) hypertension: Secondary | ICD-10-CM

## 2020-06-06 NOTE — Progress Notes (Signed)
Order placed for BUN/Cr

## 2020-06-07 ENCOUNTER — Telehealth: Payer: Self-pay | Admitting: Internal Medicine

## 2020-06-07 MED ORDER — PREDNISONE 50 MG PO TABS: ORAL_TABLET | ORAL | 0 refills | Status: DC

## 2020-06-07 NOTE — Telephone Encounter (Signed)
rx sent in to [facility] [place].  Please make sure this is correct protocol - with what they are sending to [place] (I assume same for MRI).  Also, make sure pt aware will need to take.

## 2020-06-07 NOTE — Telephone Encounter (Signed)
Patient aware and confirmed that directions are correct per radiology.

## 2020-06-07 NOTE — Telephone Encounter (Signed)
Called MRI dept. They stated that she had a severe reaction to the CT contrast so the radiology dept recommended doing the prep prednisone for the MRI because they are similar.

## 2020-06-07 NOTE — Telephone Encounter (Signed)
She just had CT with contrast 05/23/20.  Did she have a reaction to this scan?

## 2020-06-07 NOTE — Telephone Encounter (Signed)
Pt needs a prep of prednisone called into pharmacy for MRI on 06/09/2020  Pharmacy is  [id number] [place], [place]  Any questions please call MRI dept [phone].  Thank you!

## 2020-06-07 NOTE — Telephone Encounter (Signed)
Dr Rockey Situ sent in prednisone

## 2020-06-09 ENCOUNTER — Other Ambulatory Visit: Payer: Self-pay | Admitting: Internal Medicine

## 2020-06-09 ENCOUNTER — Ambulatory Visit
Admission: RE | Admit: 2020-06-09 | Discharge: 2020-06-09 | Disposition: A | Discharge status: Home / Self Care | Payer: PPO | Source: Ambulatory Visit | Attending: Internal Medicine | Admitting: Internal Medicine

## 2020-06-09 DIAGNOSIS — K769 Liver disease, unspecified: Secondary | ICD-10-CM | POA: Diagnosis not present

## 2020-06-09 DIAGNOSIS — K7689 Other specified diseases of liver: Secondary | ICD-10-CM | POA: Diagnosis not present

## 2020-06-09 DIAGNOSIS — N281 Cyst of kidney, acquired: Secondary | ICD-10-CM | POA: Diagnosis not present

## 2020-06-09 DIAGNOSIS — R935 Abnormal findings on diagnostic imaging of other abdominal regions, including retroperitoneum: Secondary | ICD-10-CM | POA: Diagnosis not present

## 2020-06-09 IMAGING — MR MR ABDOMEN WO/W CM
17 series · 46 of 48 positions shown · IV contrast (6ml Gadavist)
Comparison: CT abdomen pelvis, [DATE]

CLINICAL DATA: Characterize hyperenhancing liver lesion seen in the
left lobe on prior CT

EXAM:
MRI ABDOMEN WITHOUT AND WITH CONTRAST
TECHNIQUE: Multiplanar multisequence MR imaging of the abdomen was performed
both before and after the administration of intravenous contrast.
CONTRAST:  6mL GADAVIST GADOBUTROL 1 MMOL/ML IV SOLN

[Series 3: T2 · coronal · 6.0mm · 1.19mm/px · 2 of 30 slices shown (1 of 2)]
[im 1/30]
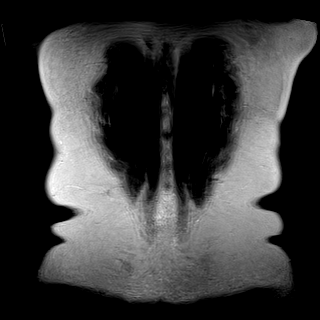
[im 30/30]
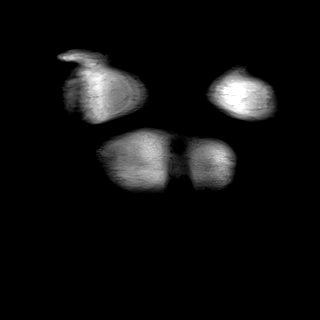

[Series 4: T2 · axial · 6.0mm · 1.19mm/px · z∈[-56,+168]mm · 2 of 32 slices shown (2 of 2)]
[im 1/32]
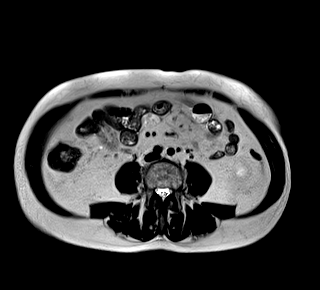
[im 32/32]
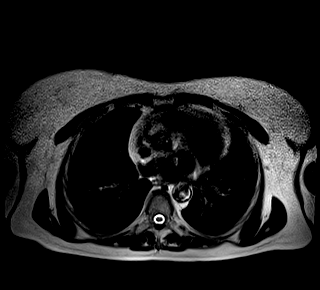

[Series 6: T2 fat-sat · axial · 6.0mm · 1.19mm/px · z∈[-72,+166]mm · 2 of 34 slices shown]
[im 1/34]
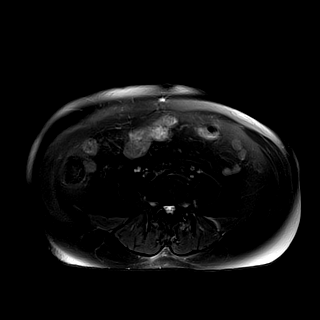
[im 34/34]
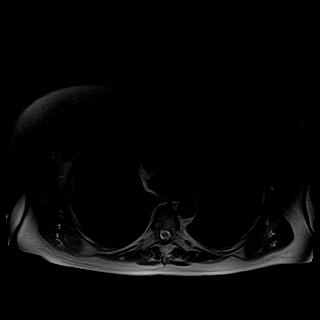

[Series 7: ax dwi_tracew · axial · 6.0mm · 1.42mm/px · z∈[-72,+166]mm · 5 of 102 slices shown]
[im 1/102]
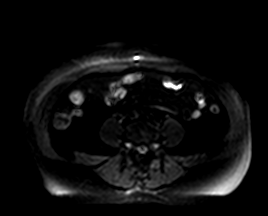
[im 26/102]
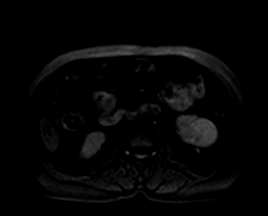
[im 51/102]
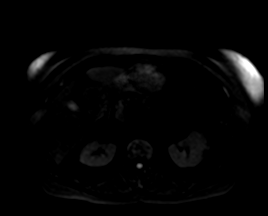
[im 76/102]
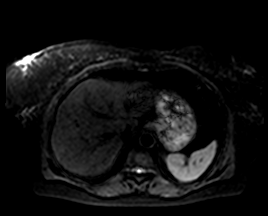
[im 102/102]
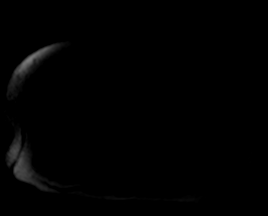

[Series 8: ax dwi_adc · axial · 6.0mm · 1.42mm/px · z∈[-72,+166]mm · 2 of 34 slices shown]
[im 1/34]
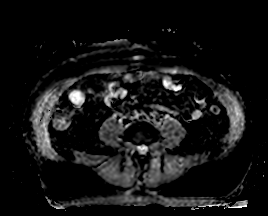
[im 34/34]
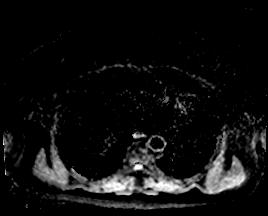

[Series 9: T1 · axial · 6.0mm · 0.74mm/px · z∈[-56,+168]mm · 4 of 64 slices shown]
[im 1/64]
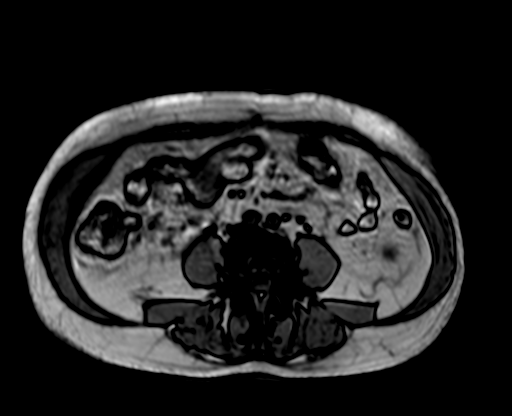
[im 22/64]
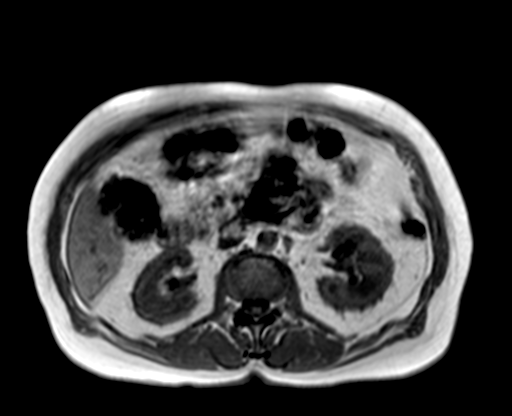
[im 43/64]
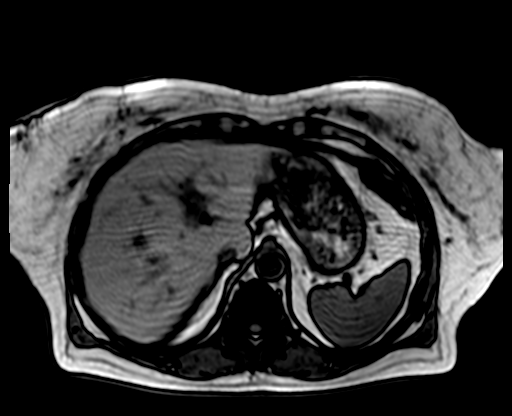
[im 64/64]
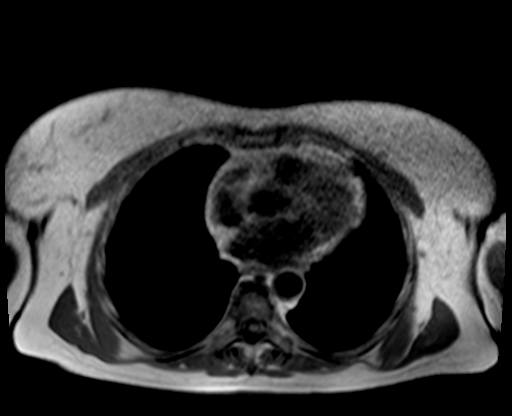

[Series 10: bSSFP · axial · 6.0mm · 0.74mm/px · 1 of 32 slices shown]
[im 1/32]
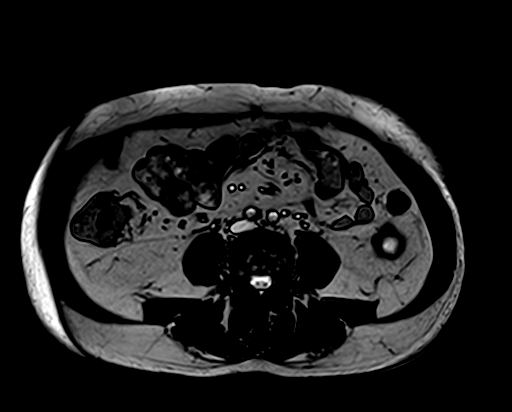

[Series 11: T1 dynamic fat-sat · axial · non-contrast · 3.0mm · 1.19mm/px · z∈[-64,+173]mm · 3 of 80 slices shown (1 of 5)]
[im 1/80]
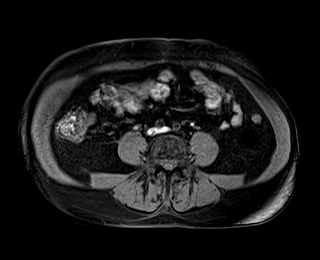
[im 40/80]
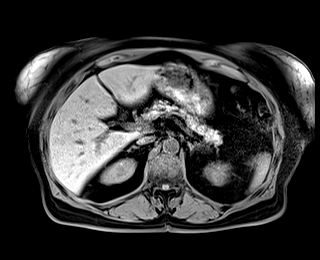
[im 80/80]
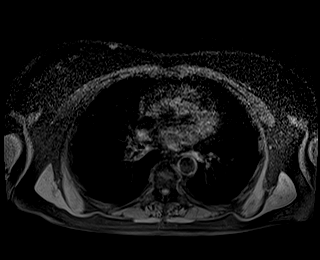

[Series 12: T1 dynamic fat-sat post-contrast · axial · 3.0mm · 1.19mm/px · z∈[-64,+173]mm · 3 of 80 slices shown (1 of 4)]
[im 1/80]
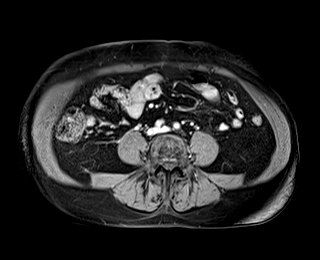
[im 40/80]
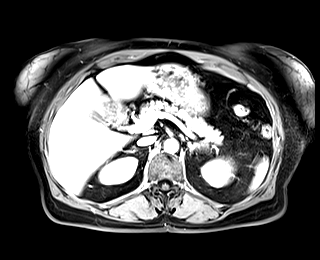
[im 80/80]
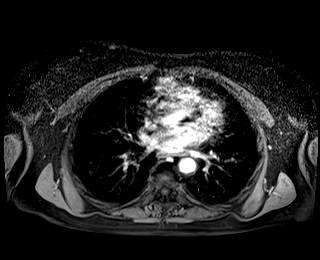

[Series 13: T1 dynamic fat-sat · axial · 3.0mm · 1.19mm/px · z∈[-64,+173]mm · 3 of 80 slices shown (2 of 5)]
[im 1/80]
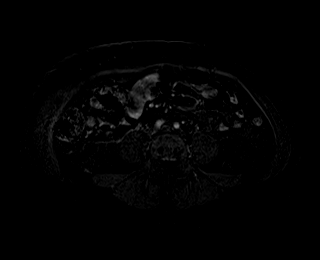
[im 40/80]
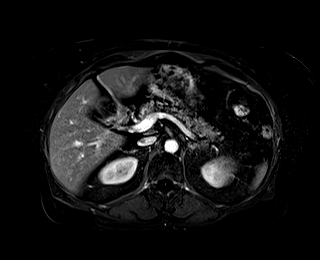
[im 80/80]
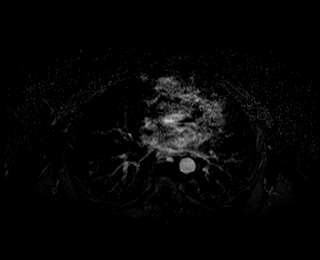

[Series 14: T1 dynamic fat-sat post-contrast · axial · 3.0mm · 1.19mm/px · z∈[-64,+173]mm · 3 of 80 slices shown (2 of 4)]
[im 1/80]
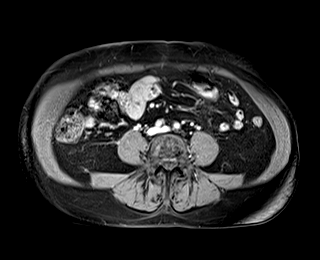
[im 40/80]
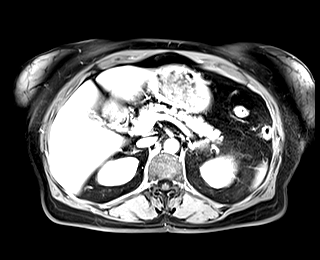
[im 80/80]
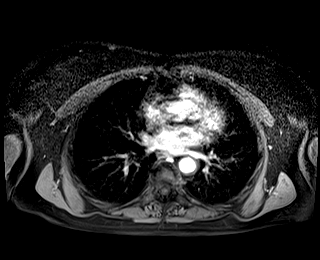

[Series 15: T1 dynamic fat-sat · axial · 3.0mm · 1.19mm/px · z∈[-64,+173]mm · 3 of 80 slices shown (3 of 5)]
[im 1/80]
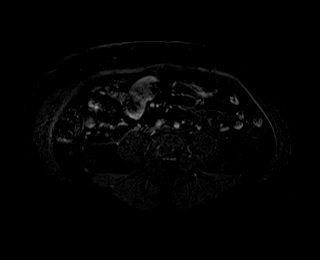
[im 40/80]
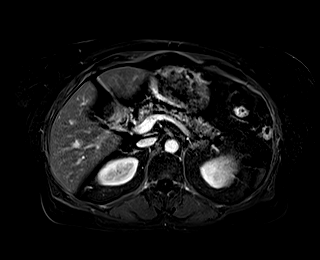
[im 80/80]
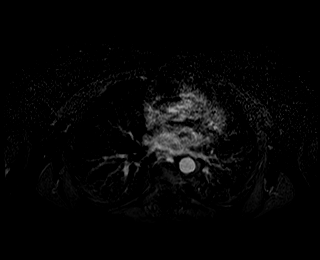

[Series 16: T1 dynamic fat-sat post-contrast · axial · 3.0mm · 1.19mm/px · z∈[-64,+173]mm · 3 of 80 slices shown (3 of 4)]
[im 1/80]
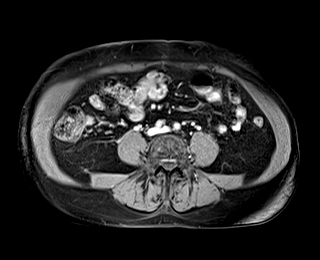
[im 40/80]
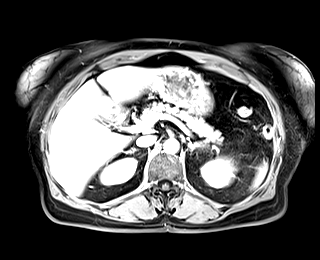
[im 80/80]
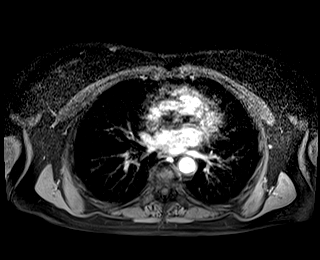

[Series 17: T1 dynamic fat-sat · axial · 3.0mm · 1.19mm/px · z∈[-64,+173]mm · 3 of 80 slices shown (4 of 5)]
[im 1/80]
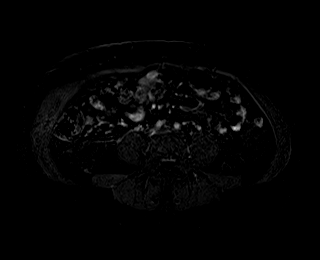
[im 40/80]
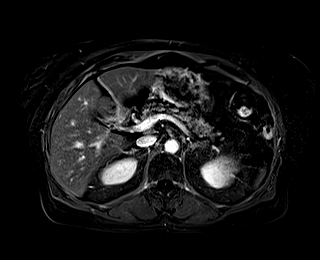
[im 80/80]
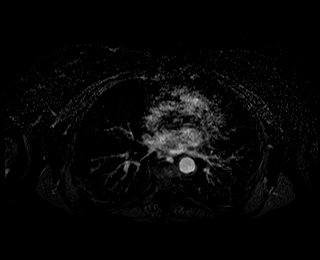

[Series 18: T1 dynamic post-contrast · coronal · 3.0mm · 1.31mm/px · 3 of 72 slices shown]
[im 1/72]
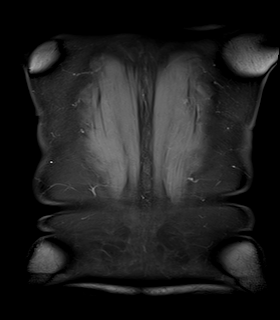
[im 36/72]
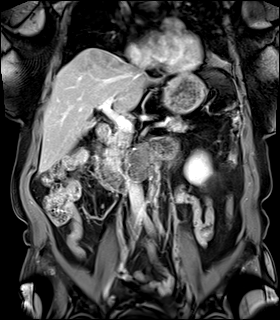
[im 72/72]
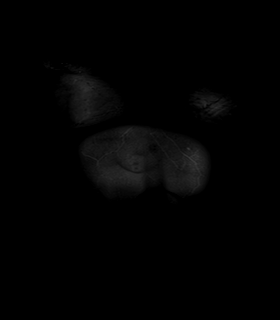

[Series 19: T1 dynamic fat-sat post-contrast · axial · 3.0mm · 1.19mm/px · z∈[-64,+173]mm · 3 of 80 slices shown (4 of 4)]
[im 1/80]
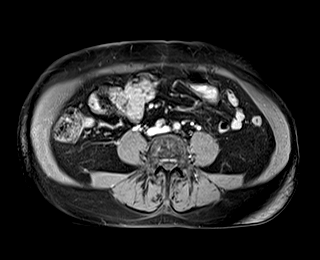
[im 40/80]
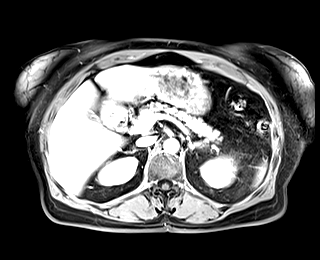
[im 80/80]
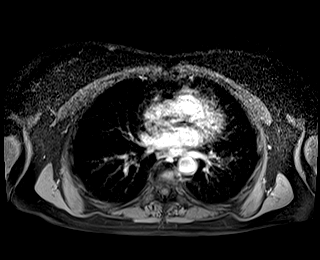

[Series 20: T1 dynamic fat-sat · axial · 3.0mm · 1.19mm/px · 1 of 80 slices shown (5 of 5)]
[im 1/80]
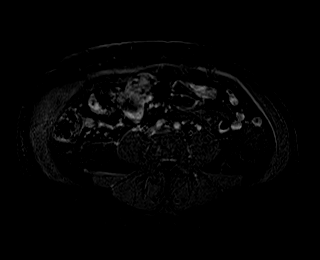

[46 of 48 positions shown; findings below may reference images not displayed]

FINDINGS: Lower chest: No acute findings.

Hepatobiliary: There is a redemonstrated, ill-defined lesion of the
superior left lobe of the liver, hepatic segment II, demonstrating
heterogeneous, although slightly T2 hyperintense intrinsic signal
with fluid signal on diffusion-weighted sequences. There is very
subtle associated contrast enhancement (series 14, image 24),
generally with gradual enhancement over time on multiphasic imaging.
This lesion is difficult to accurately measure although the main
component is approximately 1.8 x 1.4 cm (series 19, image 23). There
is an additional subcentimeter, nonenhancing cyst of the anterior
liver dome (series 12, image 17). No solid mass or other parenchymal
abnormality identified.

Pancreas: No mass, inflammatory changes, or other parenchymal
abnormality identified.

Spleen:  Within normal limits in size and appearance.

Adrenals/Urinary Tract: No masses identified. Multiple nonenhancing
cortical and parapelvic cysts of the left kidney. No evidence of
hydronephrosis.

Stomach/Bowel: Visualized portions within the abdomen are
unremarkable.

Vascular/Lymphatic: No pathologically enlarged lymph nodes
identified. No abdominal aortic aneurysm demonstrated.

Other:  None.

Musculoskeletal: No suspicious bone lesions identified.
IMPRESSION: There is a redemonstrated, ill-defined lesion of the superior left
lobe of the liver, hepatic segment II, with heterogeneous signal
generally suggesting fluid and with gradual over time on multiphasic
imaging. This lesion is difficult to accurately measure although the
main component is approximately 1.8 x 1.4 cm. This lesion is of
uncertain etiology although is most likely an atypical hemangioma.
There are no specific features concerning for hepatocellular
carcinoma or other malignancy. Consider follow-up MRI in 6-12 months
to establish ongoing stability.

## 2020-06-09 MED ORDER — GADOBUTROL 1 MMOL/ML IV SOLN
6.0000 mL | Freq: Once | INTRAVENOUS | Status: AC | PRN
  Administered 2020-06-09: 6 mL via INTRAVENOUS

## 2020-06-17 DIAGNOSIS — Z859 Personal history of malignant neoplasm, unspecified: Secondary | ICD-10-CM | POA: Diagnosis not present

## 2020-06-17 DIAGNOSIS — L578 Other skin changes due to chronic exposure to nonionizing radiation: Secondary | ICD-10-CM | POA: Diagnosis not present

## 2020-06-17 DIAGNOSIS — L570 Actinic keratosis: Secondary | ICD-10-CM | POA: Diagnosis not present

## 2020-06-17 DIAGNOSIS — Z872 Personal history of diseases of the skin and subcutaneous tissue: Secondary | ICD-10-CM | POA: Diagnosis not present

## 2020-07-01 ENCOUNTER — Other Ambulatory Visit: Payer: Self-pay | Admitting: Internal Medicine

## 2020-07-15 ENCOUNTER — Ambulatory Visit: Payer: PPO | Admitting: Internal Medicine

## 2020-07-29 ENCOUNTER — Encounter: Payer: Self-pay | Admitting: Internal Medicine

## 2020-07-29 ENCOUNTER — Other Ambulatory Visit: Payer: Self-pay

## 2020-07-29 ENCOUNTER — Ambulatory Visit (INDEPENDENT_AMBULATORY_CARE_PROVIDER_SITE_OTHER): Payer: PPO | Admitting: Internal Medicine

## 2020-07-29 VITALS — BP 134/78 | HR 70 | Temp 97.5°F | Resp 16 | Ht 62.0 in | Wt 143.0 lb

## 2020-07-29 DIAGNOSIS — R0989 Other specified symptoms and signs involving the circulatory and respiratory systems: Secondary | ICD-10-CM | POA: Diagnosis not present

## 2020-07-29 DIAGNOSIS — K219 Gastro-esophageal reflux disease without esophagitis: Secondary | ICD-10-CM | POA: Diagnosis not present

## 2020-07-29 DIAGNOSIS — Z23 Encounter for immunization: Secondary | ICD-10-CM

## 2020-07-29 DIAGNOSIS — D1803 Hemangioma of intra-abdominal structures: Secondary | ICD-10-CM

## 2020-07-29 DIAGNOSIS — E039 Hypothyroidism, unspecified: Secondary | ICD-10-CM | POA: Diagnosis not present

## 2020-07-29 DIAGNOSIS — E119 Type 2 diabetes mellitus without complications: Secondary | ICD-10-CM | POA: Diagnosis not present

## 2020-07-29 DIAGNOSIS — E7800 Pure hypercholesterolemia, unspecified: Secondary | ICD-10-CM | POA: Diagnosis not present

## 2020-07-29 DIAGNOSIS — I10 Essential (primary) hypertension: Secondary | ICD-10-CM

## 2020-07-29 LAB — BASIC METABOLIC PANEL
BUN: 25 mg/dL — ABNORMAL HIGH (ref 6–23)
CO2: 26 mEq/L (ref 19–32)
Calcium: 10.2 mg/dL (ref 8.4–10.5)
Chloride: 104 mEq/L (ref 96–112)
Creatinine, Ser: 0.9 mg/dL (ref 0.40–1.20)
GFR: 60.66 mL/min (ref >60.00)
Glucose, Bld: 143 mg/dL — ABNORMAL HIGH (ref 70–99)
Potassium: 4.3 mEq/L (ref 3.5–5.1)
Sodium: 140 mEq/L (ref 135–145)

## 2020-07-29 LAB — LIPID PANEL
Cholesterol: 244 mg/dL — ABNORMAL HIGH (ref 0–200)
HDL: 37.3 mg/dL — ABNORMAL LOW (ref >39.00)
NonHDL: 207.18
Total CHOL/HDL Ratio: 7
Triglycerides: 269 mg/dL — ABNORMAL HIGH (ref 0.0–149.0)
VLDL: 53.8 mg/dL — ABNORMAL HIGH (ref 0.0–40.0)

## 2020-07-29 LAB — HEPATIC FUNCTION PANEL
ALT: 10 U/L (ref 0–35)
AST: 12 U/L (ref 0–37)
Albumin: 4.4 g/dL (ref 3.5–5.2)
Alkaline Phosphatase: 66 U/L (ref 39–117)
Bilirubin, Direct: 0.1 mg/dL (ref 0.0–0.3)
Total Bilirubin: 0.7 mg/dL (ref 0.2–1.2)
Total Protein: 7 g/dL (ref 6.0–8.3)

## 2020-07-29 LAB — HEMOGLOBIN A1C: Hgb A1c MFr Bld: 6.7 % — ABNORMAL HIGH (ref 4.6–6.5)

## 2020-07-29 LAB — LDL CHOLESTEROL, DIRECT: Direct LDL: 139 mg/dL

## 2020-07-29 LAB — TSH: TSH: 2.07 u[IU]/mL (ref 0.35–4.50)

## 2020-07-29 NOTE — Progress Notes (Signed)
Patient ID: Mariah Moody, female   DOB: [birth year], 79 y.o.   MRN: [id number]   Subjective:    Patient ID: Mariah Moody, female    DOB: 02-25-1941, 79 y.o.   MRN: [id number]  HPI This visit occurred during the SARS-CoV-2 public health emergency.  Safety protocols were in place, including screening questions prior to the visit, additional usage of staff PPE, and extensive cleaning of exam room while observing appropriate contact time as indicated for disinfecting solutions.  Patient here for a scheduled follow up.  She reports she is doing relatively well.  Working.  Handling stress.  Does report some low back discomfort.  Notices when walking.  Stops and rest - resolves.  No chest pain or sob reported.  No abdominal pain or bowel change reported.  Blood pressure averaging 134/78 at home. Taking benicar, hydralazine and hctz.  Tolerating.    Past Medical History:  Diagnosis Date  . Anemia   . Coronary atherosclerosis of native coronary vessel   . Diabetes mellitus without complication ([place])   . Fibroid   . GERD (gastroesophageal reflux disease)   . History of chicken pox   . Hyperlipidemia   . Hypertension   . Hypothyroidism    Past Surgical History:  Procedure Laterality Date  . COLONOSCOPY N/A 04/15/2015   Procedure: COLONOSCOPY;  Surgeon: Hulen Luster, MD;  Location: [facility] ENDOSCOPY;  Service: Gastroenterology;  Laterality: N/A;  . ESOPHAGOGASTRODUODENOSCOPY N/A 04/15/2015   Procedure: ESOPHAGOGASTRODUODENOSCOPY (EGD);  Surgeon: Hulen Luster, MD;  Location: [facility] ENDOSCOPY;  Service: Gastroenterology;  Laterality: N/A;  . TUBAL LIGATION     Family History  Problem Relation Age of Onset  . Arthritis Mother   . Hypertension Mother   . Colon polyps Mother   . Heart disease Father   . Hypertension Father   . Diabetes Brother   . Cancer Neg Hx   . Breast cancer Neg Hx    Social History   Socioeconomic History  . Marital status: Married    Spouse name: Not on file  .  Number of children: Not on file  . Years of education: Not on file  . Highest education level: Not on file  Occupational History  . Not on file  Tobacco Use  . Smoking status: Former Research scientist (life sciences)  . Smokeless tobacco: Never Used  Substance and Sexual Activity  . Alcohol use: Yes    Alcohol/week: 0.0 standard drinks    Comment: occas  . Drug use: No  . Sexual activity: Not Currently  Other Topics Concern  . Not on file  Social History Narrative  . Not on file   Social Determinants of Health   Financial Resource Strain:   . Difficulty of Paying Living Expenses: Not on file  Food Insecurity:   . Worried About Charity fundraiser in the Last Year: Not on file  . Ran Out of Food in the Last Year: Not on file  Transportation Needs:   . Lack of Transportation (Medical): Not on file  . Lack of Transportation (Non-Medical): Not on file  Physical Activity:   . Days of Exercise per Week: Not on file  . Minutes of Exercise per Session: Not on file  Stress:   . Feeling of Stress : Not on file  Social Connections:   . Frequency of Communication with Friends and Family: Not on file  . Frequency of Social Gatherings with Friends and Family: Not on file  . Attends Religious Services: Not on  file  . Active Member of Clubs or Organizations: Not on file  . Attends Archivist Meetings: Not on file  . Marital Status: Not on file    Outpatient Encounter Medications as of 07/29/2020  Medication Sig  . hydrALAZINE (APRESOLINE) 25 MG tablet Take 25 mg by mouth 3 (three) times daily.  . Calcium Carbonate-Vitamin D (CALCIUM + D PO) Take 1 tablet by mouth daily.  . Cholecalciferol (VITAMIN D PO) Take 1 capsule by mouth daily.   Marland Kitchen estradiol-norethindrone (COMBIPATCH) 0.05-0.25 MG/DAY Place 1 patch onto the skin 2 (two) times a week.  . hydrochlorothiazide (HYDRODIURIL) 25 MG tablet TAKE 1 TABLET BY MOUTH EVERY DAY  . levothyroxine (SYNTHROID) 88 MCG tablet TAKE 1 TABLET (88 MCG TOTAL) DAILY BY  MOUTH.  . meloxicam (MOBIC) 7.5 MG tablet TAKE 1 TABLET BY MOUTH EVERY DAY  . Multiple Vitamins-Minerals (MULTIVITAMIN PO) Take 1 tablet by mouth daily.   Marland Kitchen olmesartan (BENICAR) 40 MG tablet TAKE 1 TABLET BY MOUTH EVERY DAY  . pantoprazole (PROTONIX) 40 MG tablet TAKE 1 TABLET BY MOUTH EVERY DAY  . [DISCONTINUED] predniSONE (DELTASONE) 50 MG tablet Take 50 mg 13 hours, 7 hours, and 1 hour prior to procedure. Take Benadryl 1 hour prior to procedure as well.   No facility-administered encounter medications on file as of 07/29/2020.    Review of Systems  Constitutional: Negative for appetite change and unexpected weight change.  HENT: Negative for congestion and sinus pressure.   Respiratory: Negative for cough, chest tightness and shortness of breath.   Cardiovascular: Negative for chest pain, palpitations and leg swelling.  Gastrointestinal: Negative for abdominal pain, diarrhea, nausea and vomiting.  Genitourinary: Negative for difficulty urinating and dysuria.  Musculoskeletal: Positive for back pain. Negative for joint swelling and myalgias.  Skin: Negative for color change and rash.  Neurological: Negative for dizziness, light-headedness and headaches.  Psychiatric/Behavioral: Negative for agitation and dysphoric mood.       Objective:    Physical Exam Vitals reviewed.  Constitutional:      General: She is not in acute distress.    Appearance: Normal appearance.  HENT:     Head: Normocephalic and atraumatic.     Right Ear: External ear normal.     Left Ear: External ear normal.  Eyes:     General: No scleral icterus.       Right eye: No discharge.        Left eye: No discharge.     Conjunctiva/sclera: Conjunctivae normal.  Neck:     Thyroid: No thyromegaly.  Cardiovascular:     Rate and Rhythm: Normal rate and regular rhythm.  Pulmonary:     Effort: No respiratory distress.     Breath sounds: Normal breath sounds. No wheezing.  Abdominal:     General: Bowel sounds  are normal.     Palpations: Abdomen is soft.     Tenderness: There is no abdominal tenderness.  Musculoskeletal:        General: No swelling or tenderness.     Cervical back: Neck supple. No tenderness.  Lymphadenopathy:     Cervical: No cervical adenopathy.  Skin:    Findings: No erythema or rash.  Neurological:     Mental Status: She is alert.  Psychiatric:        Mood and Affect: Mood normal.        Behavior: Behavior normal.     BP 134/78   Pulse 70   Temp (!) 97.5 F (36.4  C) (Oral)   Resp 16   Ht '5\' 2"'  (1.575 m)   Wt 143 lb (64.9 kg)   SpO2 99%   BMI 26.16 kg/m  Wt Readings from Last 3 Encounters:  07/29/20 143 lb (64.9 kg)  04/29/20 143 lb (64.9 kg)  04/10/20 142 lb (64.4 kg)     Lab Results  Component Value Date   WBC 5.4 02/05/2020   HGB 12.6 02/05/2020   HCT 37.6 02/05/2020   PLT 251.0 02/05/2020   GLUCOSE 143 (H) 07/29/2020   CHOL 244 (H) 07/29/2020   TRIG 269.0 (H) 07/29/2020   HDL 37.30 (L) 07/29/2020   LDLDIRECT 139.0 07/29/2020   LDLCALC 135 (H) 02/25/2016   ALT 10 07/29/2020   AST 12 07/29/2020   NA 140 07/29/2020   K 4.3 07/29/2020   CL 104 07/29/2020   CREATININE 0.90 07/29/2020   BUN 25 (H) 07/29/2020   CO2 26 07/29/2020   TSH 2.07 07/29/2020   HGBA1C 6.7 (H) 07/29/2020   MICROALBUR 0.8 10/02/2019    MR Abdomen W Wo Contrast  Result Date: 06/09/2020 CLINICAL DATA:  Characterize hyperenhancing liver lesion seen in the left lobe on prior CT EXAM: MRI ABDOMEN WITHOUT AND WITH CONTRAST TECHNIQUE: Multiplanar multisequence MR imaging of the abdomen was performed both before and after the administration of intravenous contrast. CONTRAST:  35m GADAVIST GADOBUTROL 1 MMOL/ML IV SOLN COMPARISON:  CT abdomen pelvis, 05/23/2020 FINDINGS: Lower chest: No acute findings. Hepatobiliary: There is a redemonstrated, ill-defined lesion of the superior left lobe of the liver, hepatic segment II, demonstrating heterogeneous, although slightly T2 hyperintense  intrinsic signal with fluid signal on diffusion-weighted sequences. There is very subtle associated contrast enhancement (series 14, image 24), generally with gradual enhancement over time on multiphasic imaging. This lesion is difficult to accurately measure although the main component is approximately 1.8 x 1.4 cm (series 19, image 23). There is an additional subcentimeter, nonenhancing cyst of the anterior liver dome (series 12, image 17). No solid mass or other parenchymal abnormality identified. Pancreas: No mass, inflammatory changes, or other parenchymal abnormality identified. Spleen:  Within normal limits in size and appearance. Adrenals/Urinary Tract: No masses identified. Multiple nonenhancing cortical and parapelvic cysts of the left kidney. No evidence of hydronephrosis. Stomach/Bowel: Visualized portions within the abdomen are unremarkable. Vascular/Lymphatic: No pathologically enlarged lymph nodes identified. No abdominal aortic aneurysm demonstrated. Other:  None. Musculoskeletal: No suspicious bone lesions identified. IMPRESSION: There is a redemonstrated, ill-defined lesion of the superior left lobe of the liver, hepatic segment II, with heterogeneous signal generally suggesting fluid and with gradual over time on multiphasic imaging. This lesion is difficult to accurately measure although the main component is approximately 1.8 x 1.4 cm. This lesion is of uncertain etiology although is most likely an atypical hemangioma. There are no specific features concerning for hepatocellular carcinoma or other malignancy. Consider follow-up MRI in 6-12 months to establish ongoing stability. Electronically Signed   By: AEddie CandleM.D.   On: 06/09/2020 18:20       Assessment & Plan:   Problem List Items Addressed This Visit    Hypothyroidism - Primary    On thyroid replacement.  Follow tsh.        Relevant Orders   TSH (Completed)   Hypercholesterolemia    Has declined cholesterol medication.   Low cholesterol diet and exercise.  Follow lipid panel.       Relevant Medications   hydrALAZINE (APRESOLINE) 25 MG tablet   Other Relevant Orders  Hepatic function panel (Completed)   Lipid panel (Completed)   Hemangioma    Found on recent MRI.  Recommended f/u MRI 6 months to confirm stable.  Discussed with pt.        GERD (gastroesophageal reflux disease)    No upper symptoms reported. On protonix.       Essential hypertension, benign    Blood pressure on recheck improved.  Has been intolerant of multiple medications.  Tolerating hydralazine and hctz.  Follow pressures.  Check metabolic panel.       Relevant Medications   hydrALAZINE (APRESOLINE) 25 MG tablet   Other Relevant Orders   Basic metabolic panel (Completed)   Diabetes mellitus type II, controlled, with no complications (HCC)    Low carb diet and exercise.  Follow met b and a1c.        Relevant Orders   Hemoglobin A1c (Completed)   Abdominal bruit    Had recent vascular study.  No evidence of aortic aneurysm or renal artery stenosis.         Other Visit Diagnoses    Need for immunization against influenza       Relevant Orders   Flu Vaccine QUAD High Dose(Fluad) (Completed)       Einar Pheasant, MD

## 2020-07-30 DIAGNOSIS — E119 Type 2 diabetes mellitus without complications: Secondary | ICD-10-CM | POA: Diagnosis not present

## 2020-07-30 DIAGNOSIS — E039 Hypothyroidism, unspecified: Secondary | ICD-10-CM | POA: Diagnosis not present

## 2020-07-30 DIAGNOSIS — R0989 Other specified symptoms and signs involving the circulatory and respiratory systems: Secondary | ICD-10-CM | POA: Diagnosis not present

## 2020-07-30 DIAGNOSIS — I10 Essential (primary) hypertension: Secondary | ICD-10-CM | POA: Diagnosis not present

## 2020-07-30 DIAGNOSIS — D1803 Hemangioma of intra-abdominal structures: Secondary | ICD-10-CM | POA: Diagnosis not present

## 2020-07-30 DIAGNOSIS — E7800 Pure hypercholesterolemia, unspecified: Secondary | ICD-10-CM | POA: Diagnosis not present

## 2020-07-30 DIAGNOSIS — Z23 Encounter for immunization: Secondary | ICD-10-CM

## 2020-07-30 DIAGNOSIS — K219 Gastro-esophageal reflux disease without esophagitis: Secondary | ICD-10-CM | POA: Diagnosis not present

## 2020-08-11 ENCOUNTER — Encounter: Payer: Self-pay | Admitting: Internal Medicine

## 2020-08-11 DIAGNOSIS — D1800 Hemangioma unspecified site: Secondary | ICD-10-CM | POA: Insufficient documentation

## 2020-08-11 NOTE — Assessment & Plan Note (Signed)
Blood pressure on recheck improved.  Has been intolerant of multiple medications.  Tolerating hydralazine and hctz.  Follow pressures.  Check metabolic panel.

## 2020-08-11 NOTE — Assessment & Plan Note (Signed)
On thyroid replacement.  Follow tsh.  

## 2020-08-11 NOTE — Assessment & Plan Note (Signed)
Has declined cholesterol medication.  Low cholesterol diet and exercise.  Follow lipid panel.   

## 2020-08-11 NOTE — Assessment & Plan Note (Signed)
No upper symptoms reported.  On protonix.   

## 2020-08-11 NOTE — Assessment & Plan Note (Signed)
Had recent vascular study.  No evidence of aortic aneurysm or renal artery stenosis.

## 2020-08-11 NOTE — Assessment & Plan Note (Signed)
Found on recent MRI.  Recommended f/u MRI 6 months to confirm stable.  Discussed with pt.

## 2020-08-11 NOTE — Assessment & Plan Note (Signed)
Low carb diet and exercise.  Follow met b and a1c.   

## 2020-09-25 ENCOUNTER — Other Ambulatory Visit: Payer: Self-pay | Admitting: Internal Medicine

## 2020-11-05 ENCOUNTER — Telehealth: Payer: Self-pay | Admitting: Internal Medicine

## 2020-11-05 ENCOUNTER — Other Ambulatory Visit: Payer: Self-pay

## 2020-11-05 MED ORDER — COMBIPATCH 0.05-0.25 MG/DAY TD PTTW: 1.0000 | MEDICATED_PATCH | TRANSDERMAL | 0 refills | Status: DC

## 2020-11-05 NOTE — Telephone Encounter (Signed)
Late entry.  See recommendation.  No further evaluation warranted.

## 2020-11-05 NOTE — Telephone Encounter (Signed)
-----   Message from [name], MD sent at 08/14/2020 11:22 AM EDT ----- Regarding: RE: question - renal cyst No, simple renal cyst (Bosniak I) does not need further eval or to be followed.    [name] ----- Message ----- From: [name], MD Sent: 08/11/2020  11:34 AM EDT To: [name], MD Subject: question - renal cyst                          Sorry to bother you.  I would like to get your opinion about f/u for this pt.  She was found to have a renal cyst (4.7cm) - incidentally found on recent CT.  (GFR 60).   Is this something that needs to be followed?  If you need to see her just let me know.    Thank you for your help - always! [name]

## 2020-11-05 NOTE — Telephone Encounter (Signed)
Rx sent in

## 2020-11-05 NOTE — Telephone Encounter (Signed)
Patient is requesting a refill on her estradiol-norethindrone [facility]) 0.05-0.25 MG/DAY, Pharmacy is [facility]

## 2020-11-16 ENCOUNTER — Other Ambulatory Visit: Payer: Self-pay | Admitting: Internal Medicine

## 2020-11-27 ENCOUNTER — Telehealth: Payer: Self-pay

## 2020-11-27 DIAGNOSIS — I10 Essential (primary) hypertension: Secondary | ICD-10-CM

## 2020-11-27 DIAGNOSIS — E7800 Pure hypercholesterolemia, unspecified: Secondary | ICD-10-CM

## 2020-11-27 DIAGNOSIS — Z23 Encounter for immunization: Secondary | ICD-10-CM

## 2020-11-27 DIAGNOSIS — E119 Type 2 diabetes mellitus without complications: Secondary | ICD-10-CM

## 2020-11-27 NOTE — Telephone Encounter (Signed)
Pt will just wait to have labs done at her appt

## 2020-11-27 NOTE — Telephone Encounter (Signed)
I ordered her labs. Can you schedule her for a fasting lab appt prior to her appt?

## 2020-11-27 NOTE — Telephone Encounter (Signed)
Pt would like to get labs done prior to her appt on 12/02/20. Please advise

## 2020-11-28 ENCOUNTER — Other Ambulatory Visit: Payer: Self-pay | Admitting: Internal Medicine

## 2020-11-28 ENCOUNTER — Telehealth: Payer: Self-pay | Admitting: Internal Medicine

## 2020-11-28 DIAGNOSIS — K769 Liver disease, unspecified: Secondary | ICD-10-CM

## 2020-11-28 NOTE — Telephone Encounter (Signed)
Pt wanted to wait til lab appt

## 2020-11-28 NOTE — Progress Notes (Signed)
AFP tumor marker added to labs.

## 2020-11-28 NOTE — Telephone Encounter (Signed)
Pt had called in for lab appt.  If possible, I would like for her to get her labs prior to her appt.  Lab orders are in.  Fasting lab.   Thank you.

## 2020-11-29 ENCOUNTER — Other Ambulatory Visit: Payer: Self-pay | Admitting: Internal Medicine

## 2020-12-02 ENCOUNTER — Encounter: Payer: Self-pay | Admitting: Internal Medicine

## 2020-12-02 ENCOUNTER — Other Ambulatory Visit: Payer: Self-pay

## 2020-12-02 ENCOUNTER — Ambulatory Visit (INDEPENDENT_AMBULATORY_CARE_PROVIDER_SITE_OTHER): Payer: PPO | Admitting: Internal Medicine

## 2020-12-02 DIAGNOSIS — Z8616 Personal history of COVID-19: Secondary | ICD-10-CM | POA: Diagnosis not present

## 2020-12-02 DIAGNOSIS — I10 Essential (primary) hypertension: Secondary | ICD-10-CM

## 2020-12-02 DIAGNOSIS — K769 Liver disease, unspecified: Secondary | ICD-10-CM | POA: Diagnosis not present

## 2020-12-02 DIAGNOSIS — E1165 Type 2 diabetes mellitus with hyperglycemia: Secondary | ICD-10-CM | POA: Diagnosis not present

## 2020-12-02 DIAGNOSIS — D1803 Hemangioma of intra-abdominal structures: Secondary | ICD-10-CM | POA: Diagnosis not present

## 2020-12-02 DIAGNOSIS — Z0000 Encounter for general adult medical examination without abnormal findings: Secondary | ICD-10-CM

## 2020-12-02 DIAGNOSIS — E039 Hypothyroidism, unspecified: Secondary | ICD-10-CM

## 2020-12-02 DIAGNOSIS — E119 Type 2 diabetes mellitus without complications: Secondary | ICD-10-CM | POA: Diagnosis not present

## 2020-12-02 DIAGNOSIS — K219 Gastro-esophageal reflux disease without esophagitis: Secondary | ICD-10-CM

## 2020-12-02 DIAGNOSIS — E7800 Pure hypercholesterolemia, unspecified: Secondary | ICD-10-CM | POA: Diagnosis not present

## 2020-12-02 LAB — BASIC METABOLIC PANEL
BUN: 22 mg/dL (ref 6–23)
CO2: 28 mEq/L (ref 19–32)
Calcium: 9.6 mg/dL (ref 8.4–10.5)
Chloride: 105 mEq/L (ref 96–112)
Creatinine, Ser: 0.9 mg/dL (ref 0.40–1.20)
GFR: 61.58 mL/min (ref >60.00)
Glucose, Bld: 127 mg/dL — ABNORMAL HIGH (ref 70–99)
Potassium: 3.9 mEq/L (ref 3.5–5.1)
Sodium: 141 mEq/L (ref 135–145)

## 2020-12-02 LAB — LIPID PANEL
Cholesterol: 214 mg/dL — ABNORMAL HIGH (ref 0–200)
HDL: 40.1 mg/dL (ref >39.00)
LDL Cholesterol: 139 mg/dL — ABNORMAL HIGH (ref 0–99)
NonHDL: 173.51
Total CHOL/HDL Ratio: 5
Triglycerides: 174 mg/dL — ABNORMAL HIGH (ref 0.0–149.0)
VLDL: 34.8 mg/dL (ref 0.0–40.0)

## 2020-12-02 LAB — CBC WITH DIFFERENTIAL/PLATELET
Basophils Absolute: 0 10*3/uL (ref 0.0–0.1)
Basophils Relative: 0.4 % (ref 0.0–3.0)
Eosinophils Absolute: 0.1 10*3/uL (ref 0.0–0.7)
Eosinophils Relative: 1.6 % (ref 0.0–5.0)
HCT: 37.6 % (ref 36.0–46.0)
Hemoglobin: 12.5 g/dL (ref 12.0–15.0)
Lymphocytes Relative: 20.7 % (ref 12.0–46.0)
Lymphs Abs: 1.5 10*3/uL (ref 0.7–4.0)
MCHC: 33.1 g/dL (ref 30.0–36.0)
MCV: 91.8 fl (ref 78.0–100.0)
Monocytes Absolute: 0.5 10*3/uL (ref 0.1–1.0)
Monocytes Relative: 6.3 % (ref 3.0–12.0)
Neutro Abs: 5.2 10*3/uL (ref 1.4–7.7)
Neutrophils Relative %: 71 % (ref 43.0–77.0)
Platelets: 323 10*3/uL (ref 150.0–400.0)
RBC: 4.09 Mil/uL (ref 3.87–5.11)
RDW: 12.6 % (ref 11.5–15.5)
WBC: 7.4 10*3/uL (ref 4.0–10.5)

## 2020-12-02 LAB — TSH: TSH: 2.6 u[IU]/mL (ref 0.35–4.50)

## 2020-12-02 LAB — HEPATIC FUNCTION PANEL
ALT: 9 U/L (ref 0–35)
AST: 13 U/L (ref 0–37)
Albumin: 4.3 g/dL (ref 3.5–5.2)
Alkaline Phosphatase: 81 U/L (ref 39–117)
Bilirubin, Direct: 0.1 mg/dL (ref 0.0–0.3)
Total Bilirubin: 0.7 mg/dL (ref 0.2–1.2)
Total Protein: 6.9 g/dL (ref 6.0–8.3)

## 2020-12-02 LAB — HM DIABETES FOOT EXAM

## 2020-12-02 LAB — HEMOGLOBIN A1C: Hgb A1c MFr Bld: 6.4 % (ref 4.6–6.5)

## 2020-12-02 NOTE — Assessment & Plan Note (Signed)
On thyroid replacement.  Follow tsh.  

## 2020-12-02 NOTE — Assessment & Plan Note (Signed)
Physical today 12/02/20.  Mammogram 02/26/20 - Birads I.  Colonoscopy 2016.  Per report, no follow up necessary.

## 2020-12-02 NOTE — Progress Notes (Signed)
Patient ID: Diera Wirkkala, female   DOB: Oct 11, 1941, 80 y.o.   MRN: [id number]   Subjective:    Patient ID: [name], female    DOB: 1941-03-15, 80 y.o.   MRN: [id number]  HPI This visit occurred during the SARS-CoV-2 public health emergency.  Safety protocols were in place, including screening questions prior to the visit, additional usage of staff PPE, and extensive cleaning of exam room while observing appropriate contact time as indicated for disinfecting solutions.  Patient here for her physical exam.  Had covid week after Christmas. Reports still has some brain fog.  Energy is improving.  Taste not back 100%, but is improving.  Occasional nausea, but overall improved.  Eating.  No vomiting.  No abdominal pain.  Bowels moving.  Blood pressures averaging low 130s/70-80.  No chest pain or sob reported.  No cough or congestion.      Past Medical History:  Diagnosis Date  . Anemia   . Coronary atherosclerosis of native coronary vessel   . Diabetes mellitus without complication ([place])   . Fibroid   . GERD (gastroesophageal reflux disease)   . History of chicken pox   . Hyperlipidemia   . Hypertension   . Hypothyroidism    Past Surgical History:  Procedure Laterality Date  . COLONOSCOPY N/A 04/15/2015   Procedure: COLONOSCOPY;  Surgeon: Hulen Luster, MD;  Location: [facility] ENDOSCOPY;  Service: Gastroenterology;  Laterality: N/A;  . ESOPHAGOGASTRODUODENOSCOPY N/A 04/15/2015   Procedure: ESOPHAGOGASTRODUODENOSCOPY (EGD);  Surgeon: Hulen Luster, MD;  Location: [facility] ENDOSCOPY;  Service: Gastroenterology;  Laterality: N/A;  . TUBAL LIGATION     Family History  Problem Relation Age of Onset  . Arthritis Mother   . Hypertension Mother   . Colon polyps Mother   . Heart disease Father   . Hypertension Father   . Diabetes Brother   . Cancer Neg Hx   . Breast cancer Neg Hx    Social History   Socioeconomic History  . Marital status: Married    Spouse name: Not on file  . Number  of children: Not on file  . Years of education: Not on file  . Highest education level: Not on file  Occupational History  . Not on file  Tobacco Use  . Smoking status: Former Research scientist (life sciences)  . Smokeless tobacco: Never Used  Substance and Sexual Activity  . Alcohol use: Yes    Alcohol/week: 0.0 standard drinks    Comment: occas  . Drug use: No  . Sexual activity: Not Currently  Other Topics Concern  . Not on file  Social History Narrative  . Not on file   Social Determinants of Health   Financial Resource Strain: Not on file  Food Insecurity: Not on file  Transportation Needs: Not on file  Physical Activity: Not on file  Stress: Not on file  Social Connections: Not on file    Outpatient Encounter Medications as of 12/02/2020  Medication Sig  . Calcium Carbonate-Vitamin D (CALCIUM + D PO) Take 1 tablet by mouth daily.  . Cholecalciferol (VITAMIN D PO) Take 1 capsule by mouth daily.   Marland Kitchen estradiol-norethindrone (COMBIPATCH) 0.05-0.25 MG/DAY Place 1 patch onto the skin 2 (two) times a week.  . hydrALAZINE (APRESOLINE) 25 MG tablet Take 25 mg by mouth 3 (three) times daily.  . hydrochlorothiazide (HYDRODIURIL) 25 MG tablet TAKE 1 TABLET BY MOUTH EVERY DAY  . levothyroxine (SYNTHROID) 88 MCG tablet TAKE 1 TABLET (88 MCG TOTAL) DAILY BY MOUTH.  Marland Kitchen  meloxicam (MOBIC) 7.5 MG tablet TAKE 1 TABLET BY MOUTH EVERY DAY  . Multiple Vitamins-Minerals (MULTIVITAMIN PO) Take 1 tablet by mouth daily.   Marland Kitchen olmesartan (BENICAR) 40 MG tablet TAKE 1 TABLET BY MOUTH EVERY DAY  . pantoprazole (PROTONIX) 40 MG tablet TAKE 1 TABLET BY MOUTH EVERY DAY   No facility-administered encounter medications on file as of 12/02/2020.    Review of Systems  Constitutional: Negative for appetite change and unexpected weight change.  HENT: Negative for congestion and sinus pressure.   Respiratory: Negative for cough, chest tightness and shortness of breath.   Cardiovascular: Negative for chest pain, palpitations and leg  swelling.  Gastrointestinal: Negative for abdominal pain, diarrhea, nausea and vomiting.  Genitourinary: Negative for difficulty urinating and dysuria.  Musculoskeletal: Negative for joint swelling and myalgias.  Skin: Negative for color change and rash.  Neurological: Negative for dizziness, light-headedness and headaches.  Psychiatric/Behavioral: Negative for agitation and dysphoric mood.       Objective:    Physical Exam Vitals reviewed.  Constitutional:      General: She is not in acute distress.    Appearance: Normal appearance.  HENT:     Head: Normocephalic and atraumatic.     Right Ear: External ear normal.     Left Ear: External ear normal.     Mouth/Throat:     Mouth: Oropharynx is clear and moist.  Eyes:     General: No scleral icterus.       Right eye: No discharge.        Left eye: No discharge.     Conjunctiva/sclera: Conjunctivae normal.  Neck:     Thyroid: No thyromegaly.  Cardiovascular:     Rate and Rhythm: Normal rate and regular rhythm.  Pulmonary:     Effort: No respiratory distress.     Breath sounds: Normal breath sounds. No wheezing.     Comments: Breasts:  No nipple discharge or nipple retraction present.  Superficial palpable nodule (moves easily) 2:00 lateral right breast.  (pt reports has been there for years - unchanged.  Has been checked).  No other palpable nodules and no axillary adenopathy.  Abdominal:     General: Bowel sounds are normal.     Palpations: Abdomen is soft.     Tenderness: There is no abdominal tenderness.  Musculoskeletal:        General: No swelling, tenderness or edema.     Cervical back: Neck supple. No tenderness.  Lymphadenopathy:     Cervical: No cervical adenopathy.  Skin:    Findings: No erythema or rash.  Neurological:     Mental Status: She is alert.  Psychiatric:        Mood and Affect: Mood normal.        Behavior: Behavior normal.     BP 132/70   Pulse 83   Temp 97.9 F (36.6 C) (Oral)   Resp 16    Ht '5\' 2"'  (1.575 m)   Wt 131 lb (59.4 kg)   SpO2 98%   BMI 23.96 kg/m  Wt Readings from Last 3 Encounters:  12/02/20 131 lb (59.4 kg)  07/29/20 143 lb (64.9 kg)  04/29/20 143 lb (64.9 kg)     Lab Results  Component Value Date   WBC 7.4 12/02/2020   HGB 12.5 12/02/2020   HCT 37.6 12/02/2020   PLT 323.0 12/02/2020   GLUCOSE 127 (H) 12/02/2020   CHOL 214 (H) 12/02/2020   TRIG 174.0 (H) 12/02/2020   HDL 40.10  12/02/2020   LDLDIRECT 139.0 07/29/2020   LDLCALC 139 (H) 12/02/2020   ALT 9 12/02/2020   AST 13 12/02/2020   NA 141 12/02/2020   K 3.9 12/02/2020   CL 105 12/02/2020   CREATININE 0.90 12/02/2020   BUN 22 12/02/2020   CO2 28 12/02/2020   TSH 2.60 12/02/2020   HGBA1C 6.4 12/02/2020   MICROALBUR 0.8 10/02/2019    MR Abdomen W Wo Contrast  Result Date: 06/09/2020 CLINICAL DATA:  Characterize hyperenhancing liver lesion seen in the left lobe on prior CT EXAM: MRI ABDOMEN WITHOUT AND WITH CONTRAST TECHNIQUE: Multiplanar multisequence MR imaging of the abdomen was performed both before and after the administration of intravenous contrast. CONTRAST:  46m GADAVIST GADOBUTROL 1 MMOL/ML IV SOLN COMPARISON:  CT abdomen pelvis, 05/23/2020 FINDINGS: Lower chest: No acute findings. Hepatobiliary: There is a redemonstrated, ill-defined lesion of the superior left lobe of the liver, hepatic segment II, demonstrating heterogeneous, although slightly T2 hyperintense intrinsic signal with fluid signal on diffusion-weighted sequences. There is very subtle associated contrast enhancement (series 14, image 24), generally with gradual enhancement over time on multiphasic imaging. This lesion is difficult to accurately measure although the main component is approximately 1.8 x 1.4 cm (series 19, image 23). There is an additional subcentimeter, nonenhancing cyst of the anterior liver dome (series 12, image 17). No solid mass or other parenchymal abnormality identified. Pancreas: No mass, inflammatory  changes, or other parenchymal abnormality identified. Spleen:  Within normal limits in size and appearance. Adrenals/Urinary Tract: No masses identified. Multiple nonenhancing cortical and parapelvic cysts of the left kidney. No evidence of hydronephrosis. Stomach/Bowel: Visualized portions within the abdomen are unremarkable. Vascular/Lymphatic: No pathologically enlarged lymph nodes identified. No abdominal aortic aneurysm demonstrated. Other:  None. Musculoskeletal: No suspicious bone lesions identified. IMPRESSION: There is a redemonstrated, ill-defined lesion of the superior left lobe of the liver, hepatic segment II, with heterogeneous signal generally suggesting fluid and with gradual over time on multiphasic imaging. This lesion is difficult to accurately measure although the main component is approximately 1.8 x 1.4 cm. This lesion is of uncertain etiology although is most likely an atypical hemangioma. There are no specific features concerning for hepatocellular carcinoma or other malignancy. Consider follow-up MRI in 6-12 months to establish ongoing stability. Electronically Signed   By: AEddie CandleM.D.   On: 06/09/2020 18:20       Assessment & Plan:   Problem List Items Addressed This Visit    Essential hypertension, benign    Blood pressures as outlined.  Doing well on hydralazine and hctz.  Follow pressures.  Follow metabolic panel.       GERD (gastroesophageal reflux disease)    No upper symptoms reported.  On protonix.        Health care maintenance    Physical today 12/02/20.  Mammogram 02/26/20 - Birads I.  Colonoscopy 2016.  Per report, no follow up necessary.       Hemangioma    Found on recent MRI.  Schedule f/u MRI to confirm stable.  Check AFP and liver panel.       Relevant Orders   MR Abdomen W Wo Contrast   History of COVID-19    Residual brain fog.  Improving.  No sob.  No chest pain.  Follow.       Hypercholesterolemia    Has declined cholesterol medication.   Low cholesterol diet and exercise.  Follow lipid panel.       Hypothyroidism    On  thyroid replacement.  Follow tsh.       Type 2 diabetes mellitus with hyperglycemia (HCC)    Low carb diet and exercise.  Follow met b and a1c.        Other Visit Diagnoses    Liver lesion           Einar Pheasant, MD

## 2020-12-03 LAB — AFP TUMOR MARKER: AFP-Tumor Marker: 1.6 ng/mL

## 2020-12-08 ENCOUNTER — Encounter: Payer: Self-pay | Admitting: Internal Medicine

## 2020-12-08 DIAGNOSIS — Z8616 Personal history of COVID-19: Secondary | ICD-10-CM | POA: Insufficient documentation

## 2020-12-08 NOTE — Assessment & Plan Note (Signed)
Residual brain fog.  Improving.  No sob.  No chest pain.  Follow.

## 2020-12-08 NOTE — Assessment & Plan Note (Signed)
Blood pressures as outlined.  Doing well on hydralazine and hctz.  Follow pressures.  Follow metabolic panel.

## 2020-12-08 NOTE — Assessment & Plan Note (Signed)
Low carb diet and exercise.  Follow met b and a1c.  

## 2020-12-08 NOTE — Assessment & Plan Note (Signed)
No upper symptoms reported.  On protonix.   

## 2020-12-08 NOTE — Assessment & Plan Note (Signed)
Has declined cholesterol medication.  Low cholesterol diet and exercise.  Follow lipid panel.   

## 2020-12-08 NOTE — Assessment & Plan Note (Signed)
Found on recent MRI.  Schedule f/u MRI to confirm stable.  Check AFP and liver panel.

## 2020-12-15 ENCOUNTER — Ambulatory Visit: Payer: PPO

## 2020-12-22 ENCOUNTER — Other Ambulatory Visit: Payer: Self-pay

## 2020-12-22 ENCOUNTER — Ambulatory Visit
Admission: RE | Admit: 2020-12-22 | Discharge: 2020-12-22 | Disposition: A | Discharge status: Home / Self Care | Payer: PPO | Source: Ambulatory Visit | Attending: Internal Medicine | Admitting: Internal Medicine

## 2020-12-22 DIAGNOSIS — N281 Cyst of kidney, acquired: Secondary | ICD-10-CM | POA: Diagnosis not present

## 2020-12-22 DIAGNOSIS — I700 Atherosclerosis of aorta: Secondary | ICD-10-CM | POA: Diagnosis not present

## 2020-12-22 DIAGNOSIS — D1803 Hemangioma of intra-abdominal structures: Secondary | ICD-10-CM

## 2020-12-22 DIAGNOSIS — K7689 Other specified diseases of liver: Secondary | ICD-10-CM | POA: Diagnosis not present

## 2020-12-22 DIAGNOSIS — D1809 Hemangioma of other sites: Secondary | ICD-10-CM | POA: Diagnosis not present

## 2020-12-22 IMAGING — MR MR ABDOMEN WO/W CM
17 series · 48 of 48 positions shown · IV contrast (gadavist)
Comparison: MRI [DATE].  CT scan [DATE].

CLINICAL DATA: Liver lesion on prior imaging.

EXAM:
MRI ABDOMEN WITHOUT AND WITH CONTRAST
TECHNIQUE: Multiplanar multisequence MR imaging of the abdomen was performed
both before and after the administration of intravenous contrast.
CONTRAST:  6mL GADAVIST GADOBUTROL 1 MMOL/ML IV SOLN

[Series 3: T2 · coronal · 6.0mm · 1.19mm/px · 2 of 30 slices shown (1 of 2)]
[im 1/30]
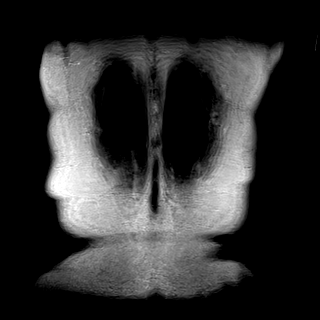
[im 30/30]
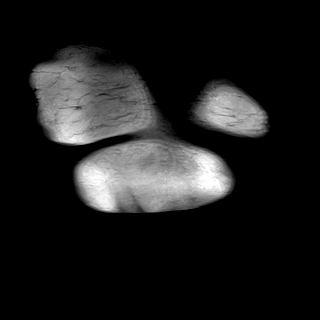

[Series 4: T2 · axial · 6.0mm · 1.19mm/px · z∈[-52,+171]mm · 2 of 32 slices shown (2 of 2)]
[im 1/32]
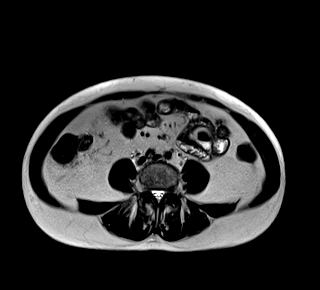
[im 32/32]
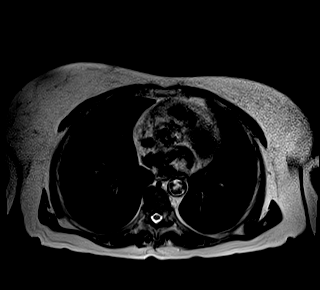

[Series 5: T1 · axial · 3.0mm · 1.19mm/px · z∈[-59,+178]mm · 3 of 80 slices shown (1 of 2)]
[im 1/80]
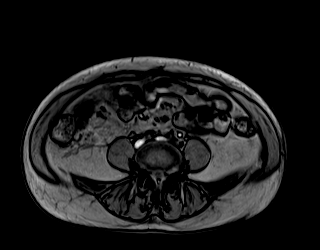
[im 40/80]
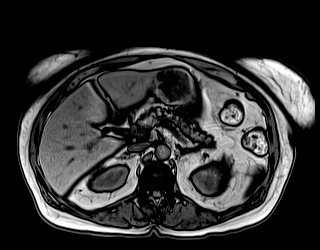
[im 80/80]
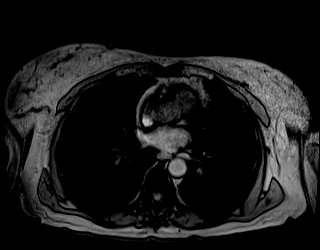

[Series 5: T1 · axial · 3.0mm · 1.19mm/px · z∈[-59,+178]mm · 3 of 80 slices shown (2 of 2)]
[im 1/80]
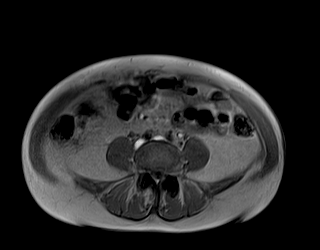
[im 40/80]
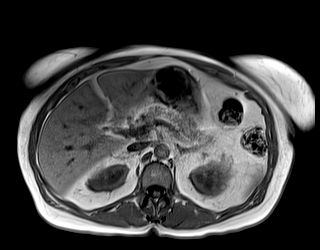
[im 80/80]
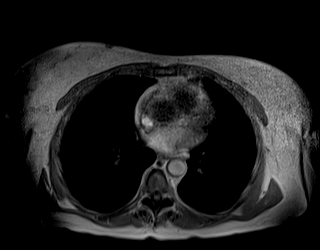

[Series 8: T2 fat-sat · axial · 6.0mm · 1.19mm/px · 1 of 34 slices shown]
[im 1/34]
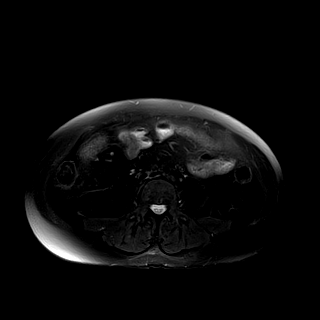

[Series 9: ax dwi_tracew · axial · 6.0mm · 1.42mm/px · z∈[-44,+194]mm · 4 of 102 slices shown]
[im 1/102]
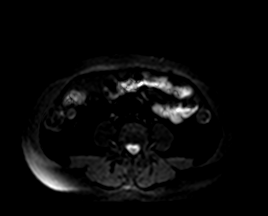
[im 34/102]
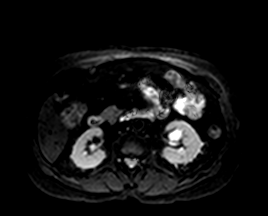
[im 68/102]
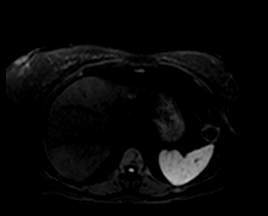
[im 102/102]
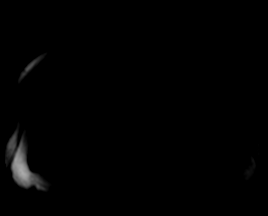

[Series 10: ax dwi_adc · axial · 6.0mm · 1.42mm/px · 1 of 34 slices shown]
[im 1/34]
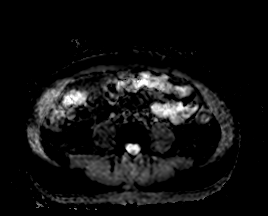

[Series 11: T1 dynamic fat-sat · axial · non-contrast · 3.0mm · 1.19mm/px · z∈[-59,+178]mm · 3 of 80 slices shown (1 of 5)]
[im 1/80]
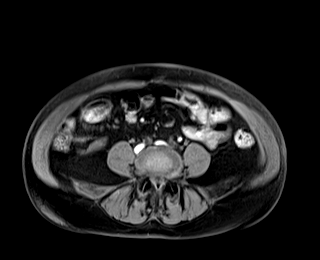
[im 40/80]
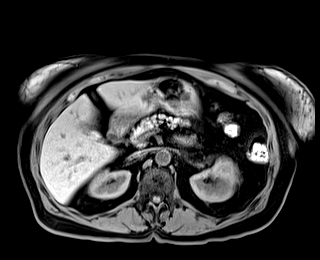
[im 80/80]
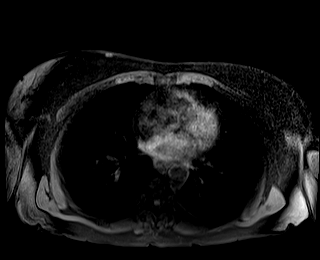

[Series 12: T1 dynamic fat-sat post-contrast · axial · 3.0mm · 1.19mm/px · z∈[-59,+178]mm · 3 of 80 slices shown (1 of 4)]
[im 1/80]
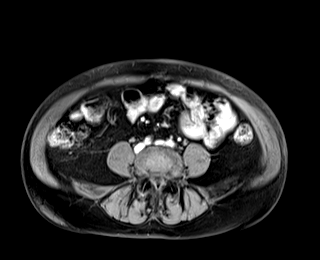
[im 40/80]
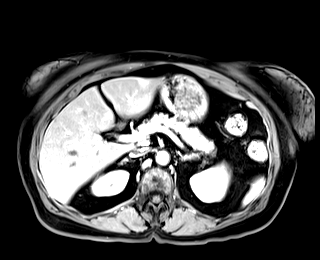
[im 80/80]
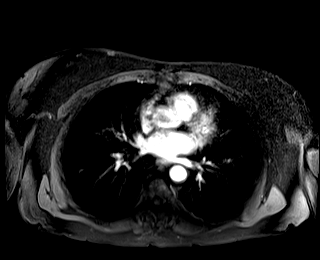

[Series 13: T1 dynamic fat-sat · axial · 3.0mm · 1.19mm/px · z∈[-59,+178]mm · 3 of 80 slices shown (2 of 5)]
[im 1/80]
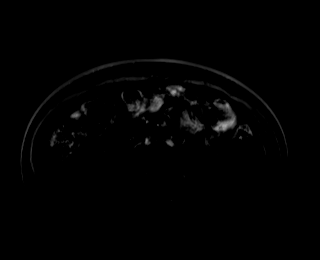
[im 40/80]
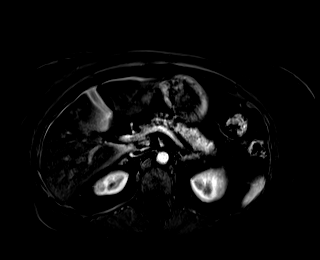
[im 80/80]
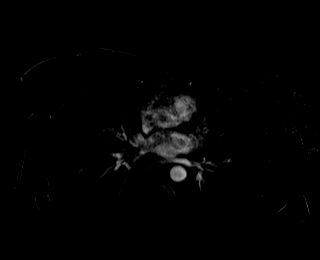

[Series 14: T1 dynamic fat-sat post-contrast · axial · 3.0mm · 1.19mm/px · z∈[-59,+178]mm · 3 of 80 slices shown (2 of 4)]
[im 1/80]
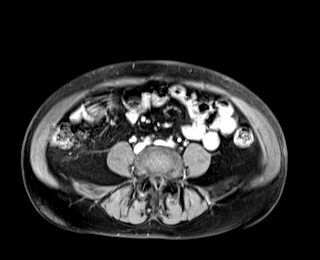
[im 40/80]
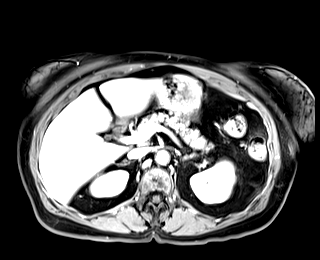
[im 80/80]
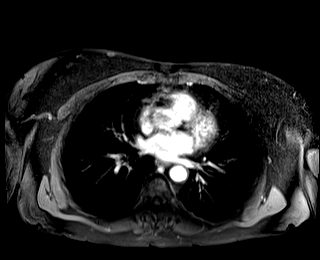

[Series 15: T1 dynamic fat-sat · axial · 3.0mm · 1.19mm/px · z∈[-59,+178]mm · 3 of 80 slices shown (3 of 5)]
[im 1/80]
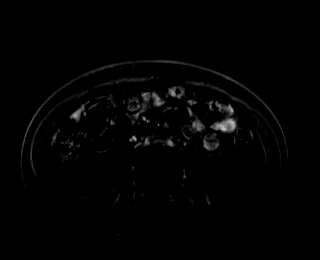
[im 40/80]
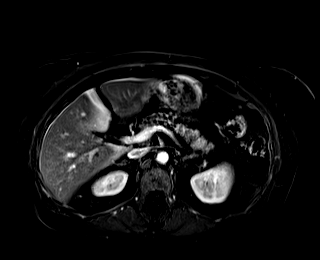
[im 80/80]
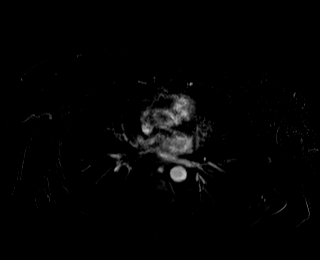

[Series 16: T1 dynamic fat-sat post-contrast · axial · 3.0mm · 1.19mm/px · z∈[-59,+178]mm · 3 of 80 slices shown (3 of 4)]
[im 1/80]
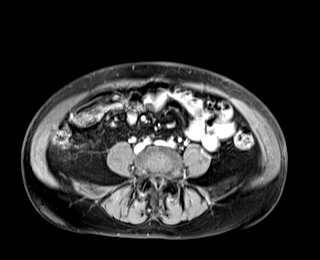
[im 40/80]
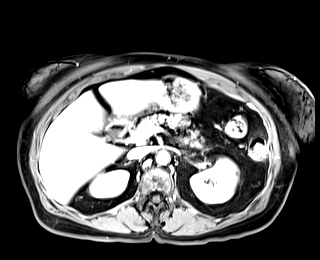
[im 80/80]
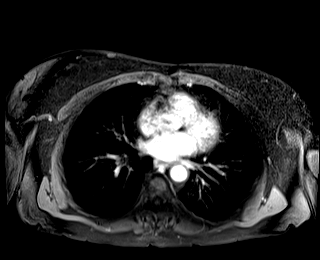

[Series 17: T1 dynamic fat-sat · axial · 3.0mm · 1.19mm/px · z∈[-59,+178]mm · 3 of 80 slices shown (4 of 5)]
[im 1/80]
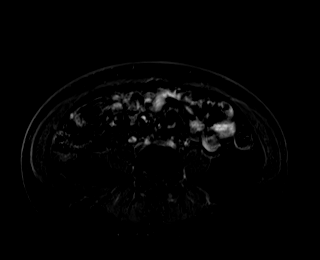
[im 40/80]
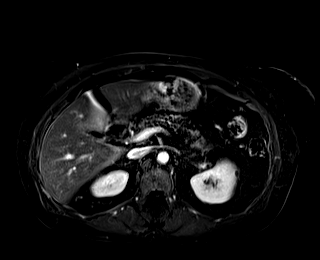
[im 80/80]
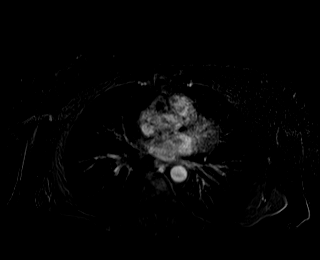

[Series 19: T1 dynamic · coronal · 1.6mm · 1.56mm/px · 5 of 128 slices shown]
[im 1/128]
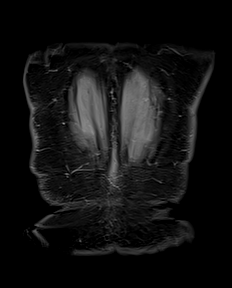
[im 32/128]
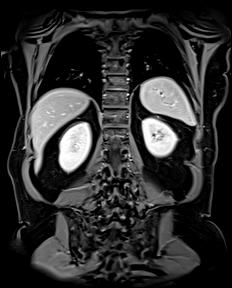
[im 64/128]
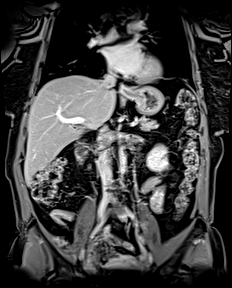
[im 96/128]
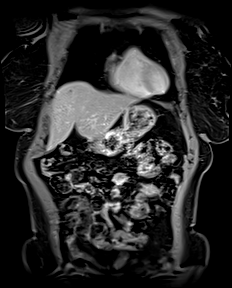
[im 128/128]
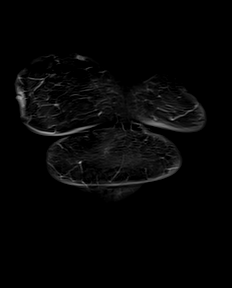

[Series 20: T1 dynamic fat-sat post-contrast · axial · 3.0mm · 1.19mm/px · z∈[-59,+178]mm · 3 of 80 slices shown (4 of 4)]
[im 1/80]
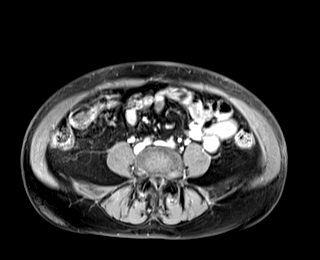
[im 40/80]
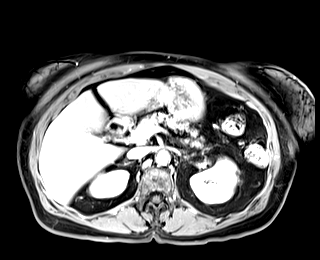
[im 80/80]
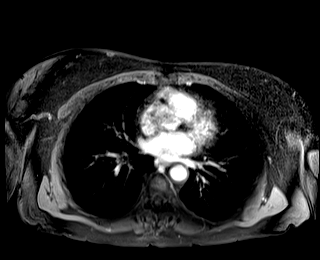

[Series 21: T1 dynamic fat-sat · axial · 3.0mm · 1.19mm/px · z∈[-59,+178]mm · 3 of 80 slices shown (5 of 5)]
[im 1/80]
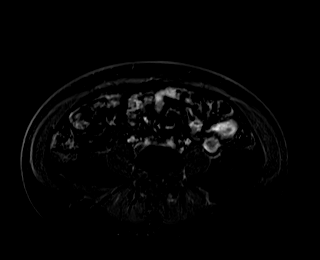
[im 40/80]
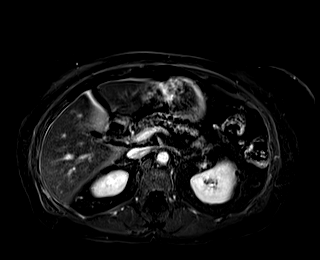
[im 80/80]
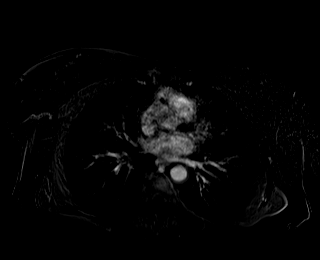

[48 of 48 positions shown; findings below may reference images not displayed]

FINDINGS: Lower chest: Unremarkable.

Hepatobiliary: Subcapsular T2 hyperintensity in the anterior hepatic
dome (segment IV) is stable in the interval, compatible with a tiny
cyst

As mentioned on the prior studies, there is a subtle ill-defined
lesion in segment II of the left liver. There is variable signal
intensity within this lesion including a tiny apparent cystic
component medially. Although difficult to reproducibly measure given
the ill-defined margins, the lesion measures approximately 1.6 x
cm on precontrast T1 imaging. No substantial restricted diffusion. 7
mm lateral component does sequentially fill in with contrast on
delayed imaging. Overall imaging features are stable compared to
prior MRI [DATE].

There is no evidence for gallstones, gallbladder wall thickening, or
pericholecystic fluid. No intrahepatic or extrahepatic biliary
dilation.

Pancreas: No focal mass lesion. No dilatation of the main duct. No
intraparenchymal cyst. No peripancreatic edema.

Spleen:  No splenomegaly. No focal mass lesion.

Adrenals/Urinary Tract: No adrenal nodule or mass. 7 mm subcapsular
cyst is noted interpolar right kidney. Unremarkable. 4.8 cm simple
cyst noted lower pole left kidney with central sinus cysts also
noted in the left kidney.

Stomach/Bowel: Stomach is unremarkable. No gastric wall thickening.
No evidence of outlet obstruction. Duodenum is normally positioned
as is the ligament of Treitz. Large duodenal diverticulum noted.No
small bowel or colonic dilatation within the visualized abdomen.

Vascular/Lymphatic: No abdominal aortic aneurysm. Atherosclerotic
change noted in the wall of the abdominal aorta. There is no
gastrohepatic or hepatoduodenal ligament lymphadenopathy. No
retroperitoneal or mesenteric lymphadenopathy.

Other:  No intraperitoneal free fluid.

Musculoskeletal: No focal suspicious marrow enhancement within the
visualized bony anatomy.
IMPRESSION: 1. Stable appearance of the subtle ill-defined heterogeneous lesion
in segment II of the left liver. Imaging features remain
indeterminate but stability since CT of [DATE] is reassuring and
no overtly suspicious or aggressive features on today's exam. As
mentioned on prior studies, atypical hemangioma remains a
consideration. Follow-up MRI in 6-12 months recommended to ensure
continued stability.
2. Bilateral renal cysts.

## 2020-12-22 MED ORDER — GADOBUTROL 1 MMOL/ML IV SOLN
6.0000 mL | Freq: Once | INTRAVENOUS | Status: AC | PRN
  Administered 2020-12-22: 6 mL via INTRAVENOUS

## 2020-12-24 ENCOUNTER — Other Ambulatory Visit: Payer: Self-pay | Admitting: Internal Medicine

## 2020-12-31 ENCOUNTER — Telehealth: Payer: Self-pay | Admitting: Internal Medicine

## 2020-12-31 MED ORDER — HYDRALAZINE HCL 25 MG PO TABS: 25.0000 mg | ORAL_TABLET | Freq: Three times a day (TID) | ORAL | 1 refills | Status: DC

## 2020-12-31 NOTE — Addendum Note (Signed)
Addended by: Elpidio Galea T on: 12/31/2020 01:01 PM   Modules accepted: Orders

## 2020-12-31 NOTE — Telephone Encounter (Signed)
pt needs a refill on hydrALAZINE (APRESOLINE) 25 MG tablet sent to [facility] [place]

## 2021-01-23 ENCOUNTER — Other Ambulatory Visit: Payer: Self-pay | Admitting: Internal Medicine

## 2021-02-17 ENCOUNTER — Other Ambulatory Visit: Payer: Self-pay | Admitting: Internal Medicine

## 2021-03-24 ENCOUNTER — Other Ambulatory Visit: Payer: Self-pay | Admitting: Internal Medicine

## 2021-04-14 ENCOUNTER — Ambulatory Visit (INDEPENDENT_AMBULATORY_CARE_PROVIDER_SITE_OTHER): Payer: PPO | Admitting: Internal Medicine

## 2021-04-14 ENCOUNTER — Other Ambulatory Visit: Payer: Self-pay

## 2021-04-14 DIAGNOSIS — Z1231 Encounter for screening mammogram for malignant neoplasm of breast: Secondary | ICD-10-CM | POA: Diagnosis not present

## 2021-04-14 DIAGNOSIS — E039 Hypothyroidism, unspecified: Secondary | ICD-10-CM | POA: Diagnosis not present

## 2021-04-14 DIAGNOSIS — D1803 Hemangioma of intra-abdominal structures: Secondary | ICD-10-CM | POA: Diagnosis not present

## 2021-04-14 DIAGNOSIS — E119 Type 2 diabetes mellitus without complications: Secondary | ICD-10-CM

## 2021-04-14 DIAGNOSIS — R0989 Other specified symptoms and signs involving the circulatory and respiratory systems: Secondary | ICD-10-CM

## 2021-04-14 DIAGNOSIS — E1165 Type 2 diabetes mellitus with hyperglycemia: Secondary | ICD-10-CM

## 2021-04-14 DIAGNOSIS — K219 Gastro-esophageal reflux disease without esophagitis: Secondary | ICD-10-CM | POA: Diagnosis not present

## 2021-04-14 DIAGNOSIS — E7800 Pure hypercholesterolemia, unspecified: Secondary | ICD-10-CM

## 2021-04-14 DIAGNOSIS — I10 Essential (primary) hypertension: Secondary | ICD-10-CM

## 2021-04-14 LAB — HEPATIC FUNCTION PANEL
ALT: 10 U/L (ref 0–35)
AST: 13 U/L (ref 0–37)
Albumin: 4.4 g/dL (ref 3.5–5.2)
Alkaline Phosphatase: 74 U/L (ref 39–117)
Bilirubin, Direct: 0.1 mg/dL (ref 0.0–0.3)
Total Bilirubin: 1.1 mg/dL (ref 0.2–1.2)
Total Protein: 6.5 g/dL (ref 6.0–8.3)

## 2021-04-14 LAB — LIPID PANEL
Cholesterol: 220 mg/dL — ABNORMAL HIGH (ref 0–200)
HDL: 36.6 mg/dL — ABNORMAL LOW (ref >39.00)
NonHDL: 183.82
Total CHOL/HDL Ratio: 6
Triglycerides: 204 mg/dL — ABNORMAL HIGH (ref 0.0–149.0)
VLDL: 40.8 mg/dL — ABNORMAL HIGH (ref 0.0–40.0)

## 2021-04-14 LAB — LDL CHOLESTEROL, DIRECT: Direct LDL: 139 mg/dL

## 2021-04-14 LAB — BASIC METABOLIC PANEL
BUN: 20 mg/dL (ref 6–23)
CO2: 29 mEq/L (ref 19–32)
Calcium: 9.7 mg/dL (ref 8.4–10.5)
Chloride: 104 mEq/L (ref 96–112)
Creatinine, Ser: 0.97 mg/dL (ref 0.40–1.20)
GFR: 56.14 mL/min — ABNORMAL LOW (ref >60.00)
Glucose, Bld: 133 mg/dL — ABNORMAL HIGH (ref 70–99)
Potassium: 4.4 mEq/L (ref 3.5–5.1)
Sodium: 141 mEq/L (ref 135–145)

## 2021-04-14 LAB — HEMOGLOBIN A1C: Hgb A1c MFr Bld: 6.3 % (ref 4.6–6.5)

## 2021-04-14 MED ORDER — COMBIPATCH 0.05-0.25 MG/DAY TD PTTW: 1.0000 | MEDICATED_PATCH | TRANSDERMAL | 1 refills | Status: DC

## 2021-04-14 NOTE — Progress Notes (Signed)
Patient ID: Mariah Moody, female   DOB: June 05, 1941, 80 y.o.   MRN: [id number]   Subjective:    Patient ID: Mariah Moody, female    DOB: 1941/09/11, 80 y.o.   MRN: [id number]  HPI This visit occurred during the SARS-CoV-2 public health emergency.  Safety protocols were in place, including screening questions prior to the visit, additional usage of staff PPE, and extensive cleaning of exam room while observing appropriate contact time as indicated for disinfecting solutions.   Patient here for a scheduled follow up.  Here to follow up regarding her blood pressure, blood sugar and cholesterol.  She reports she is doing relatively well.  Increased stress recently.  Husband has been in the hospital - recent stroke.  Overall she feels she is handling things relatively well.  Has good support.  No chest pain or sob reported.  No abdominal pain reported.  Bowels moving.  No leg pain with ambulation.  Discussed due f/u MRI - f/u liver lesion.  She wants to schedule for August.  Has not been checking her blood pressures regularly.  Plans to get a new cuff.    Past Medical History:  Diagnosis Date   Anemia    Coronary atherosclerosis of native coronary vessel    Diabetes mellitus without complication (HCC)    Fibroid    GERD (gastroesophageal reflux disease)    History of chicken pox    Hyperlipidemia    Hypertension    Hypothyroidism    Past Surgical History:  Procedure Laterality Date   COLONOSCOPY N/A 04/15/2015   Procedure: COLONOSCOPY;  Surgeon: Hulen Luster, MD;  Location: [facility] ENDOSCOPY;  Service: Gastroenterology;  Laterality: N/A;   ESOPHAGOGASTRODUODENOSCOPY N/A 04/15/2015   Procedure: ESOPHAGOGASTRODUODENOSCOPY (EGD);  Surgeon: Hulen Luster, MD;  Location: [facility] ENDOSCOPY;  Service: Gastroenterology;  Laterality: N/A;   TUBAL LIGATION     Family History  Problem Relation Age of Onset   Arthritis Mother    Hypertension Mother    Colon polyps Mother    Heart disease Father     Hypertension Father    Diabetes Brother    Cancer Neg Hx    Breast cancer Neg Hx    Social History   Socioeconomic History   Marital status: Married    Spouse name: Not on file   Number of children: Not on file   Years of education: Not on file   Highest education level: Not on file  Occupational History   Not on file  Tobacco Use   Smoking status: Former    Pack years: 0.00   Smokeless tobacco: Never  Substance and Sexual Activity   Alcohol use: Yes    Alcohol/week: 0.0 standard drinks    Comment: occas   Drug use: No   Sexual activity: Not Currently  Other Topics Concern   Not on file  Social History Narrative   Not on file   Social Determinants of Health   Financial Resource Strain: Not on file  Food Insecurity: Not on file  Transportation Needs: Not on file  Physical Activity: Not on file  Stress: Not on file  Social Connections: Not on file    Outpatient Encounter Medications as of 04/14/2021  Medication Sig   Calcium Carbonate-Vitamin D (CALCIUM + D PO) Take 1 tablet by mouth daily.   Cholecalciferol (VITAMIN D PO) Take 1 capsule by mouth daily.    estradiol-norethindrone (COMBIPATCH) 0.05-0.25 MG/DAY Place 1 patch onto the skin 2 (two) times a week.  hydrALAZINE (APRESOLINE) 25 MG tablet TAKE 1 TABLET BY MOUTH THREE TIMES A DAY   hydrochlorothiazide (HYDRODIURIL) 25 MG tablet TAKE 1 TABLET BY MOUTH EVERY DAY   levothyroxine (SYNTHROID) 88 MCG tablet TAKE 1 TABLET (88 MCG TOTAL) DAILY BY MOUTH.   meloxicam (MOBIC) 7.5 MG tablet TAKE 1 TABLET BY MOUTH EVERY DAY   Multiple Vitamins-Minerals (MULTIVITAMIN PO) Take 1 tablet by mouth daily.    olmesartan (BENICAR) 40 MG tablet TAKE 1 TABLET BY MOUTH EVERY DAY   pantoprazole (PROTONIX) 40 MG tablet TAKE 1 TABLET BY MOUTH EVERY DAY   [DISCONTINUED] estradiol-norethindrone (COMBIPATCH) 0.05-0.25 MG/DAY Place 1 patch onto the skin 2 (two) times a week.   [DISCONTINUED] estradiol-norethindrone (COMBIPATCH) 0.05-0.25  MG/DAY Place 1 patch onto the skin 2 (two) times a week.   No facility-administered encounter medications on file as of 04/14/2021.     Review of Systems  Constitutional:  Negative for appetite change and unexpected weight change.  HENT:  Negative for congestion and sinus pressure.   Respiratory:  Negative for cough, chest tightness and shortness of breath.   Cardiovascular:  Negative for chest pain and palpitations.  Gastrointestinal:  Negative for abdominal pain, diarrhea, nausea and vomiting.  Genitourinary:  Negative for difficulty urinating and dysuria.  Musculoskeletal:  Negative for joint swelling and myalgias.  Skin:  Negative for color change and rash.  Neurological:  Negative for dizziness, light-headedness and headaches.  Psychiatric/Behavioral:  Negative for agitation and dysphoric mood.        Increased stress as outlined.       Objective:    Physical Exam Vitals reviewed.  Constitutional:      General: She is not in acute distress.    Appearance: Normal appearance.  HENT:     Head: Normocephalic and atraumatic.     Right Ear: External ear normal.     Left Ear: External ear normal.  Eyes:     General: No scleral icterus.       Right eye: No discharge.        Left eye: No discharge.     Conjunctiva/sclera: Conjunctivae normal.  Neck:     Thyroid: No thyromegaly.  Cardiovascular:     Rate and Rhythm: Normal rate and regular rhythm.  Pulmonary:     Effort: No respiratory distress.     Breath sounds: Normal breath sounds. No wheezing.  Abdominal:     General: Bowel sounds are normal.     Palpations: Abdomen is soft.     Tenderness: There is no abdominal tenderness.     Comments: Audible abdominal bruit.   Musculoskeletal:        General: No tenderness.     Cervical back: Neck supple. No tenderness.     Comments: Unable to palpate DP pulse.  Foot warm.  No ulcerations.    Lymphadenopathy:     Cervical: No cervical adenopathy.  Skin:    Findings: No  erythema or rash.  Neurological:     Mental Status: She is alert.  Psychiatric:        Mood and Affect: Mood normal.        Behavior: Behavior normal.    BP 126/70   Pulse 75   Temp 97.8 F (36.6 C)   Resp 16   Ht 5\' 2"  (1.575 m)   Wt 132 lb (59.9 kg)   SpO2 98%   BMI 24.14 kg/m  Wt Readings from Last 3 Encounters:  04/14/21 132 lb (59.9 kg)  12/02/20  131 lb (59.4 kg)  07/29/20 143 lb (64.9 kg)     Lab Results  Component Value Date   WBC 7.4 12/02/2020   HGB 12.5 12/02/2020   HCT 37.6 12/02/2020   PLT 323.0 12/02/2020   GLUCOSE 133 (H) 04/14/2021   CHOL 220 (H) 04/14/2021   TRIG 204.0 (H) 04/14/2021   HDL 36.60 (L) 04/14/2021   LDLDIRECT 139.0 04/14/2021   LDLCALC 139 (H) 12/02/2020   ALT 10 04/14/2021   AST 13 04/14/2021   NA 141 04/14/2021   K 4.4 04/14/2021   CL 104 04/14/2021   CREATININE 0.97 04/14/2021   BUN 20 04/14/2021   CO2 29 04/14/2021   TSH 2.60 12/02/2020   HGBA1C 6.3 04/14/2021   MICROALBUR 0.8 10/02/2019    MR Abdomen W Wo Contrast  Result Date: 12/23/2020 CLINICAL DATA:  Liver lesion on prior imaging. EXAM: MRI ABDOMEN WITHOUT AND WITH CONTRAST TECHNIQUE: Multiplanar multisequence MR imaging of the abdomen was performed both before and after the administration of intravenous contrast. CONTRAST:  22mL GADAVIST GADOBUTROL 1 MMOL/ML IV SOLN COMPARISON:  MRI 06/09/2020.  CT scan 04/23/2020. FINDINGS: Lower chest: Unremarkable. Hepatobiliary: Subcapsular T2 hyperintensity in the anterior hepatic dome (segment IV) is stable in the interval, compatible with a tiny cyst As mentioned on the prior studies, there is a subtle ill-defined lesion in segment II of the left liver. There is variable signal intensity within this lesion including a tiny apparent cystic component medially. Although difficult to reproducibly measure given the ill-defined margins, the lesion measures approximately 1.6 x 1.3 cm on precontrast T1 imaging. No substantial restricted  diffusion. 7 mm lateral component does sequentially fill in with contrast on delayed imaging. Overall imaging features are stable compared to prior MRI 06/09/2020. There is no evidence for gallstones, gallbladder wall thickening, or pericholecystic fluid. No intrahepatic or extrahepatic biliary dilation. Pancreas: No focal mass lesion. No dilatation of the main duct. No intraparenchymal cyst. No peripancreatic edema. Spleen:  No splenomegaly. No focal mass lesion. Adrenals/Urinary Tract: No adrenal nodule or mass. 7 mm subcapsular cyst is noted interpolar right kidney. Unremarkable. 4.8 cm simple cyst noted lower pole left kidney with central sinus cysts also noted in the left kidney. Stomach/Bowel: Stomach is unremarkable. No gastric wall thickening. No evidence of outlet obstruction. Duodenum is normally positioned as is the ligament of Treitz. Large duodenal diverticulum noted.No small bowel or colonic dilatation within the visualized abdomen. Vascular/Lymphatic: No abdominal aortic aneurysm. Atherosclerotic change noted in the wall of the abdominal aorta. There is no gastrohepatic or hepatoduodenal ligament lymphadenopathy. No retroperitoneal or mesenteric lymphadenopathy. Other:  No intraperitoneal free fluid. Musculoskeletal: No focal suspicious marrow enhancement within the visualized bony anatomy. IMPRESSION: 1. Stable appearance of the subtle ill-defined heterogeneous lesion in segment II of the left liver. Imaging features remain indeterminate but stability since CT of 04/23/2020 is reassuring and no overtly suspicious or aggressive features on today's exam. As mentioned on prior studies, atypical hemangioma remains a consideration. Follow-up MRI in 6-12 months recommended to ensure continued stability. 2. Bilateral renal cysts. Electronically Signed   By: Misty Stanley M.D.   On: 12/23/2020 07:47       Assessment & Plan:   Problem List Items Addressed This Visit     Abdominal bruit    Had  vascular ultrasound that revealed no evidence of renal artery stenosis. Previous CT - no aneurysm identified.  Given bruit and diminished distal pulses, will have vascular surgery evaluate with question of need for further  w/up.         Relevant Orders   Ambulatory referral to Vascular Surgery   Diminished pulse    Given - not able to palpate distal pulses and given abdominal bruit, discussed f/u with vascular surgery - for further evaluation and question of need for further w/up.  She is agreeable.  No leg pain or cramping with increased activity.         Relevant Orders   Ambulatory referral to Vascular Surgery   Essential hypertension, benign    Blood pressure as outlined.  Continue hydralazine and hctz.  Follow pressures.  Follow metabolic panel.        GERD (gastroesophageal reflux disease)    No upper symptoms reported.  On protonix.         Hemangioma    Liver lesion noted on MRI.  Last MRI 12/23/20 - recommended f/u MRI in 6-12 months to ensure stability.  Check liver panel and AFP.  Schedule f/u MRI.         Relevant Orders   AFP tumor marker   MR Abdomen W Wo Contrast   Hypercholesterolemia    Has declined cholesterol medication.  Continue low cholesterol diet and exercise.  Follow lipid panel.        Relevant Orders   Basic metabolic panel (Completed)   Hepatic function panel (Completed)   Lipid panel (Completed)   Hypothyroidism    On thyroid replacement.  Follow tsh.        Type 2 diabetes mellitus with hyperglycemia ([place])    She watches what she eats.  Stays active.  Check metabolic panel and P2Z.        Other Visit Diagnoses     Visit for screening mammogram       Relevant Orders   MM 3D SCREEN BREAST BILATERAL   Controlled type 2 diabetes mellitus without complication, without long-term current use of insulin (HCC)       Relevant Orders   Hemoglobin A1c (Completed)        Einar Pheasant, MD

## 2021-04-15 ENCOUNTER — Encounter: Payer: Self-pay | Admitting: Internal Medicine

## 2021-04-15 ENCOUNTER — Telehealth: Payer: Self-pay

## 2021-04-15 LAB — AFP TUMOR MARKER: AFP-Tumor Marker: 1.3 ng/mL

## 2021-04-15 NOTE — Assessment & Plan Note (Signed)
Blood pressure as outlined.  Continue hydralazine and hctz.  Follow pressures.  Follow metabolic panel.

## 2021-04-15 NOTE — Assessment & Plan Note (Signed)
Had vascular ultrasound that revealed no evidence of renal artery stenosis. Previous CT - no aneurysm identified.  Given bruit and diminished distal pulses, will have vascular surgery evaluate with question of need for further w/up.

## 2021-04-15 NOTE — Assessment & Plan Note (Signed)
Has declined cholesterol medication.  Continue low cholesterol diet and exercise.  Follow lipid panel.  

## 2021-04-15 NOTE — Assessment & Plan Note (Signed)
She watches what she eats.  Stays active.  Check metabolic panel and D6U.

## 2021-04-15 NOTE — Assessment & Plan Note (Signed)
Given - not able to palpate distal pulses and given abdominal bruit, discussed f/u with vascular surgery - for further evaluation and question of need for further w/up.  She is agreeable.  No leg pain or cramping with increased activity.

## 2021-04-15 NOTE — Telephone Encounter (Signed)
Attempted to contact patient. Call could not be completed and went directly to dial tone.. Could not leave a message to call back.

## 2021-04-15 NOTE — Assessment & Plan Note (Signed)
Liver lesion noted on MRI.  Last MRI 12/23/20 - recommended f/u MRI in 6-12 months to ensure stability.  Check liver panel and AFP.  Schedule f/u MRI.

## 2021-04-15 NOTE — Assessment & Plan Note (Signed)
On thyroid replacement.  Follow tsh.  

## 2021-04-15 NOTE — Assessment & Plan Note (Signed)
No upper symptoms reported.  On protonix.   

## 2021-04-16 ENCOUNTER — Other Ambulatory Visit: Payer: Self-pay | Admitting: Internal Medicine

## 2021-04-17 ENCOUNTER — Telehealth: Payer: Self-pay

## 2021-04-17 NOTE — Telephone Encounter (Signed)
Left message to call back  

## 2021-04-28 ENCOUNTER — Ambulatory Visit: Payer: PPO

## 2021-04-30 ENCOUNTER — Telehealth: Payer: Self-pay

## 2021-04-30 DIAGNOSIS — E7800 Pure hypercholesterolemia, unspecified: Secondary | ICD-10-CM

## 2021-04-30 NOTE — Telephone Encounter (Signed)
Labs ordered for basic metabolic panel for future labs.

## 2021-05-13 ENCOUNTER — Other Ambulatory Visit: Payer: Self-pay | Admitting: Internal Medicine

## 2021-05-18 ENCOUNTER — Other Ambulatory Visit: Payer: Self-pay | Admitting: Internal Medicine

## 2021-05-20 ENCOUNTER — Other Ambulatory Visit: Payer: Self-pay | Admitting: Internal Medicine

## 2021-05-29 ENCOUNTER — Other Ambulatory Visit (INDEPENDENT_AMBULATORY_CARE_PROVIDER_SITE_OTHER): Payer: Self-pay | Admitting: Nurse Practitioner

## 2021-05-29 DIAGNOSIS — R0989 Other specified symptoms and signs involving the circulatory and respiratory systems: Secondary | ICD-10-CM

## 2021-06-02 ENCOUNTER — Other Ambulatory Visit: Payer: Self-pay

## 2021-06-02 ENCOUNTER — Encounter (INDEPENDENT_AMBULATORY_CARE_PROVIDER_SITE_OTHER): Payer: Self-pay | Admitting: Vascular Surgery

## 2021-06-02 ENCOUNTER — Ambulatory Visit (INDEPENDENT_AMBULATORY_CARE_PROVIDER_SITE_OTHER): Payer: PPO

## 2021-06-02 ENCOUNTER — Ambulatory Visit (INDEPENDENT_AMBULATORY_CARE_PROVIDER_SITE_OTHER): Payer: PPO | Admitting: Vascular Surgery

## 2021-06-02 VITALS — BP 176/64 | HR 66 | Resp 18 | Ht 62.0 in | Wt 134.0 lb

## 2021-06-02 DIAGNOSIS — E7800 Pure hypercholesterolemia, unspecified: Secondary | ICD-10-CM

## 2021-06-02 DIAGNOSIS — R0989 Other specified symptoms and signs involving the circulatory and respiratory systems: Secondary | ICD-10-CM

## 2021-06-02 DIAGNOSIS — I739 Peripheral vascular disease, unspecified: Secondary | ICD-10-CM

## 2021-06-02 DIAGNOSIS — I10 Essential (primary) hypertension: Secondary | ICD-10-CM

## 2021-06-02 DIAGNOSIS — E1165 Type 2 diabetes mellitus with hyperglycemia: Secondary | ICD-10-CM | POA: Diagnosis not present

## 2021-06-03 ENCOUNTER — Encounter (INDEPENDENT_AMBULATORY_CARE_PROVIDER_SITE_OTHER): Payer: Self-pay | Admitting: Vascular Surgery

## 2021-06-03 ENCOUNTER — Telehealth: Payer: Self-pay | Admitting: Internal Medicine

## 2021-06-03 DIAGNOSIS — I739 Peripheral vascular disease, unspecified: Secondary | ICD-10-CM | POA: Insufficient documentation

## 2021-06-03 DIAGNOSIS — R0989 Other specified symptoms and signs involving the circulatory and respiratory systems: Secondary | ICD-10-CM | POA: Insufficient documentation

## 2021-06-03 NOTE — Progress Notes (Signed)
MRN : [id number]  Mariah Moody is a 80 y.o. (1941/07/31) female who presents with chief complaint of  Chief Complaint  Patient presents with   New Patient (Initial Visit)    Abdominal bruit  .  History of Present Illness:    The patient is seen for evaluation of painful lower extremities and diminished pulses. Patient notes the pain is always associated with activity and is fairly consistent day today.  She describes it occurring mostly in the thighs but also in the buttocks area.  Typically, the pain occurs at relatively short distances, progress is as activity continues to the point that the patient must stop walking. Resting including standing still for several minutes allowed resumption of the activity and the ability to walk a similar distance before stopping again. Uneven terrain and inclined shorten the distance. The pain has been progressive over the past several years. The patient states the inability to walk is now having a profound negative impact on quality of life and daily activities.  The patient denies rest pain or dangling of an extremity off the side of the bed during the night for relief. No open wounds or sores at this time. No prior interventions or surgeries.  No history of back problems or DJD of the lumbar sacral spine.   The patient denies changes in claudication symptoms or new rest pain symptoms.  No new ulcers or wounds of the foot.  The patient's blood pressure has been stable and relatively well controlled. The patient denies amaurosis fugax or recent TIA symptoms. There are no recent neurological changes noted. The patient denies history of DVT, PE or superficial thrombophlebitis. The patient denies recent episodes of angina or shortness of breath.  ABIs obtained today show right equals 0.74 and left equals 0.74 (however, there are triphasic Doppler signals bilaterally)  Current Meds  Medication Sig   Calcium Carbonate-Vitamin D (CALCIUM + D PO)  Take 1 tablet by mouth daily.   estradiol-norethindrone (COMBIPATCH) 0.05-0.25 MG/DAY Place 1 patch onto the skin 2 (two) times a week.   hydrALAZINE (APRESOLINE) 25 MG tablet TAKE 1 TABLET BY MOUTH THREE TIMES A DAY   hydrochlorothiazide (HYDRODIURIL) 25 MG tablet TAKE 1 TABLET BY MOUTH EVERY DAY   levothyroxine (SYNTHROID) 88 MCG tablet TAKE 1 TABLET (88 MCG TOTAL) DAILY BY MOUTH.   meloxicam (MOBIC) 7.5 MG tablet TAKE 1 TABLET BY MOUTH EVERY DAY   Multiple Vitamins-Minerals (MULTIVITAMIN PO) Take 1 tablet by mouth daily.    olmesartan (BENICAR) 40 MG tablet TAKE 1 TABLET BY MOUTH EVERY DAY   pantoprazole (PROTONIX) 40 MG tablet TAKE 1 TABLET BY MOUTH EVERY DAY    Past Medical History:  Diagnosis Date   Anemia    Coronary atherosclerosis of native coronary vessel    Diabetes mellitus without complication (HCC)    Fibroid    GERD (gastroesophageal reflux disease)    History of chicken pox    Hyperlipidemia    Hypertension    Hypothyroidism     Past Surgical History:  Procedure Laterality Date   COLONOSCOPY N/A 04/15/2015   Procedure: COLONOSCOPY;  Surgeon: Hulen Luster, MD;  Location: [facility] ENDOSCOPY;  Service: Gastroenterology;  Laterality: N/A;   ESOPHAGOGASTRODUODENOSCOPY N/A 04/15/2015   Procedure: ESOPHAGOGASTRODUODENOSCOPY (EGD);  Surgeon: Hulen Luster, MD;  Location: [facility] ENDOSCOPY;  Service: Gastroenterology;  Laterality: N/A;   TUBAL LIGATION      Social History Social History   Tobacco Use   Smoking status: Former  Smokeless tobacco: Never  Substance Use Topics   Alcohol use: Yes    Alcohol/week: 0.0 standard drinks    Comment: occas   Drug use: No    Family History Family History  Problem Relation Age of Onset   Arthritis Mother    Hypertension Mother    Colon polyps Mother    Heart disease Father    Hypertension Father    Diabetes Brother    Cancer Neg Hx    Breast cancer Neg Hx   No family history of bleeding/clotting disorders, porphyria or autoimmune  disease   Allergies  Allergen Reactions   Statins     Other reaction(s): Muscle Pain   Sulfa Antibiotics     headaches   Iodine Rash     REVIEW OF SYSTEMS (Negative unless checked)  Constitutional: '[]'$ Weight loss  '[]'$ Fever  '[]'$ Chills Cardiac: '[]'$ Chest pain   '[]'$ Chest pressure   '[]'$ Palpitations   '[]'$ Shortness of breath when laying flat   '[]'$ Shortness of breath with exertion. Vascular:  '[]'$ Pain in legs with walking   '[x]'$ Pain in legs at rest  '[]'$ History of DVT   '[]'$ Phlebitis   '[]'$ Swelling in legs   '[]'$ Varicose veins   '[]'$ Non-healing ulcers Pulmonary:   '[]'$ Uses home oxygen   '[]'$ Productive cough   '[]'$ Hemoptysis   '[]'$ Wheeze  '[]'$ COPD   '[]'$ Asthma Neurologic:  '[]'$ Dizziness   '[]'$ Seizures   '[]'$ History of stroke   '[]'$ History of TIA  '[]'$ Aphasia   '[]'$ Vissual changes   '[]'$ Weakness or numbness in arm   '[]'$ Weakness or numbness in leg Musculoskeletal:   '[]'$ Joint swelling   '[]'$ Joint pain   '[]'$ Low back pain Hematologic:  '[]'$ Easy bruising  '[]'$ Easy bleeding   '[]'$ Hypercoagulable state   '[]'$ Anemic Gastrointestinal:  '[]'$ Diarrhea   '[]'$ Vomiting  '[x]'$ Gastroesophageal reflux/heartburn   '[]'$ Difficulty swallowing. Genitourinary:  '[]'$ Chronic kidney disease   '[]'$ Difficult urination  '[]'$ Frequent urination   '[]'$ Blood in urine Skin:  '[]'$ Rashes   '[]'$ Ulcers  Psychological:  '[]'$ History of anxiety   '[]'$  History of major depression.  Physical Examination  Vitals:   06/02/21 0904  BP: (!) 176/64  Pulse: 66  Resp: 18  Weight: 134 lb (60.8 kg)  Height: '5\' 2"'$  (1.575 m)   Body mass index is 24.51 kg/m. Gen: WD/WN, NAD Head: Mariah Moody/AT, No temporalis wasting.  Ear/Nose/Throat: Hearing grossly intact, nares w/o erythema or drainage, poor dentition Eyes: PER, EOMI, sclera nonicteric.  Neck: Supple, no masses.  No bruit or JVD.  Pulmonary:  Good air movement, clear to auscultation bilaterally, no use of accessory muscles.  Cardiac: RRR, normal S1, S2, no Murmurs. Vascular: Right carotid bruit noted Vessel Right Left  Radial Palpable Palpable  Carotid Palpable Palpable   PT Not Palpable Not Palpable  DP Trace Palpable Trace Palpable  Gastrointestinal: soft, non-distended. No guarding/no peritoneal signs.  Musculoskeletal: M/S 5/5 throughout.  No deformity or atrophy.  Neurologic: CN 2-12 intact. Pain and light touch intact in extremities.  Symmetrical.  Speech is fluent. Motor exam as listed above. Psychiatric: Judgment intact, Mood & affect appropriate for pt's clinical situation. Dermatologic: No rashes or ulcers noted.  No changes consistent with cellulitis.  CBC Lab Results  Component Value Date   WBC 7.4 12/02/2020   HGB 12.5 12/02/2020   HCT 37.6 12/02/2020   MCV 91.8 12/02/2020   PLT 323.0 12/02/2020    BMET    Component Value Date/Time   NA 141 04/14/2021 0846   K 4.4 04/14/2021 0846   CL 104 04/14/2021 0846   CO2 29 04/14/2021 0846   GLUCOSE  133 (H) 04/14/2021 0846   BUN 20 04/14/2021 0846   CREATININE 0.97 04/14/2021 0846   CALCIUM 9.7 04/14/2021 0846   CrCl cannot be calculated (Patient's most recent lab result is older than the maximum 21 days allowed.).  COAG No results found for: INR, PROTIME  Radiology VAS [place] ABI WITH/WO TBI  Result Date: 06/02/2021  LOWER EXTREMITY DOPPLER STUDY Patient Name:  Mariah Moody  Date of Exam:   06/02/2021 Medical Rec #: [id number]            Accession #:    [id number] Date of Birth: 10/17/1941            Patient Gender: F Patient Age:   50Y Exam Location:  [facility] Procedure:      VAS [place] ABI WITH/WO TBI Referring Phys: [id number] [place] --------------------------------------------------------------------------------  Indications: Claudication.  Performing Technologist: Charlane Ferretti RT (R)(VS)  Examination Guidelines: A complete evaluation includes at minimum, Doppler waveform signals and systolic blood pressure reading at the level of bilateral brachial, anterior tibial, and posterior tibial arteries, when vessel segments are accessible. Bilateral testing is considered an  integral part of a complete examination. Photoelectric Plethysmograph (PPG) waveforms and toe systolic pressure readings are included as required and additional duplex testing as needed. Limited examinations for reoccurring indications may be performed as noted.  ABI Findings: +---------+------------------+-----+---------+--------+ Right    Rt Pressure (mmHg)IndexWaveform Comment  +---------+------------------+-----+---------+--------+ Brachial 192                                      +---------+------------------+-----+---------+--------+ ATA      135               0.70 triphasic         +---------+------------------+-----+---------+--------+ PTA      142               0.74 biphasic          +---------+------------------+-----+---------+--------+ Great Toe95                0.49 Abnormal          +---------+------------------+-----+---------+--------+ +---------+------------------+-----+--------+-------+ Left     Lt Pressure (mmHg)IndexWaveformComment +---------+------------------+-----+--------+-------+ Brachial 180                                    +---------+------------------+-----+--------+-------+ ATA      142               0.74 biphasic        +---------+------------------+-----+--------+-------+ PTA      125               0.65 biphasic        +---------+------------------+-----+--------+-------+ Great Toe106               0.55 Abnormal        +---------+------------------+-----+--------+-------+ Summary: Right: Resting right ankle-brachial index indicates moderate right lower extremity arterial disease. The right toe-brachial index is abnormal. Left: Resting left ankle-brachial index indicates moderate left lower extremity arterial disease. The left toe-brachial index is abnormal. *See table(s) above for measurements and observations.  Electronically signed by Hortencia Pilar MD on 06/02/2021 at 5:08:37 PM.    Final      Assessment/Plan 1. PAD  (peripheral artery disease) (HCC) Recommend:  Patient should undergo arterial duplex of the aorta iliac  system because she has increasing symptoms in the patient's lower extremity symptoms.  The patient states they are having increased pain and a marked decrease in the distance that they can walk and it is very limiting to her lifestyle.  The risks and benefits as well as the alternatives were discussed in detail with the patient.  All questions were answered.  Patient agrees to proceed and understands this could be a prelude to angiography and intervention.  The patient will follow up with me in the office to review the studies.   - VAS US AORTA/IVC/ILIACS; Future  2. Bruit of right carotid artery Recommend:  Given the patient's asymptomatic carotid bruit no invasive testing or surgery is anticipated at this time.  Duplex ultrasound will be ordered to more completely evaluate the carotid bruit.  Continue antiplatelet therapy as prescribed Continue management of CAD, HTN and Hyperlipidemia Healthy heart diet,  encouraged exercise at least 4 times per week Follow up in 1-2 months with duplex ultrasound and physical exam   - VAS US CAROTID; Future  3. Essential hypertension, benign Continue antihypertensive medications as already ordered, these medications have been reviewed and there are no changes at this time.   4. Type 2 diabetes mellitus with hyperglycemia, without long-term current use of insulin (HCC) Continue hypoglycemic medications as already ordered, these medications have been reviewed and there are no changes at this time.  Hgb A1C to be monitored as already arranged by primary service   5. Hypercholesterolemia Continue statin as ordered and reviewed, no changes at this time     Hortencia Pilar, MD  06/03/2021 8:31 AM

## 2021-06-03 NOTE — Telephone Encounter (Signed)
Left message for patient to call back and schedule Medicare Annual Wellness Visit (AWV) in office.   If not able to come in office, please offer to do virtually or by telephone.   Due for AWVI  Please schedule at anytime with Nurse Health Advisor.   

## 2021-06-03 NOTE — Telephone Encounter (Signed)
Patient called and declined to schedule an AWVI

## 2021-06-08 ENCOUNTER — Ambulatory Visit: Payer: PPO

## 2021-06-09 ENCOUNTER — Other Ambulatory Visit (INDEPENDENT_AMBULATORY_CARE_PROVIDER_SITE_OTHER): Payer: PPO

## 2021-06-09 ENCOUNTER — Ambulatory Visit
Admission: RE | Admit: 2021-06-09 | Discharge: 2021-06-09 | Disposition: A | Discharge status: Home / Self Care | Payer: PPO | Source: Ambulatory Visit | Attending: Internal Medicine | Admitting: Internal Medicine

## 2021-06-09 ENCOUNTER — Other Ambulatory Visit: Payer: Self-pay

## 2021-06-09 DIAGNOSIS — Z1231 Encounter for screening mammogram for malignant neoplasm of breast: Secondary | ICD-10-CM

## 2021-06-09 DIAGNOSIS — E7800 Pure hypercholesterolemia, unspecified: Secondary | ICD-10-CM

## 2021-06-09 LAB — BASIC METABOLIC PANEL
BUN: 27 mg/dL — ABNORMAL HIGH (ref 6–23)
CO2: 27 mEq/L (ref 19–32)
Calcium: 10.1 mg/dL (ref 8.4–10.5)
Chloride: 104 mEq/L (ref 96–112)
Creatinine, Ser: 1.05 mg/dL (ref 0.40–1.20)
GFR: 50.99 mL/min — ABNORMAL LOW (ref >60.00)
Glucose, Bld: 133 mg/dL — ABNORMAL HIGH (ref 70–99)
Potassium: 4.3 mEq/L (ref 3.5–5.1)
Sodium: 140 mEq/L (ref 135–145)

## 2021-06-09 IMAGING — MG MM DIGITAL SCREENING BILAT W/ TOMO AND CAD
8 series · 8 of 24 positions shown · non-contrast
Comparison: Previous exam(s).

CLINICAL DATA: Screening.

EXAM:
DIGITAL SCREENING BILATERAL MAMMOGRAM WITH TOMOSYNTHESIS AND CAD
TECHNIQUE: Bilateral screening digital craniocaudal and mediolateral oblique
mammograms were obtained. Bilateral screening digital breast
tomosynthesis was performed. The images were evaluated with
computer-aided detection.

[R MLO synth-2D]
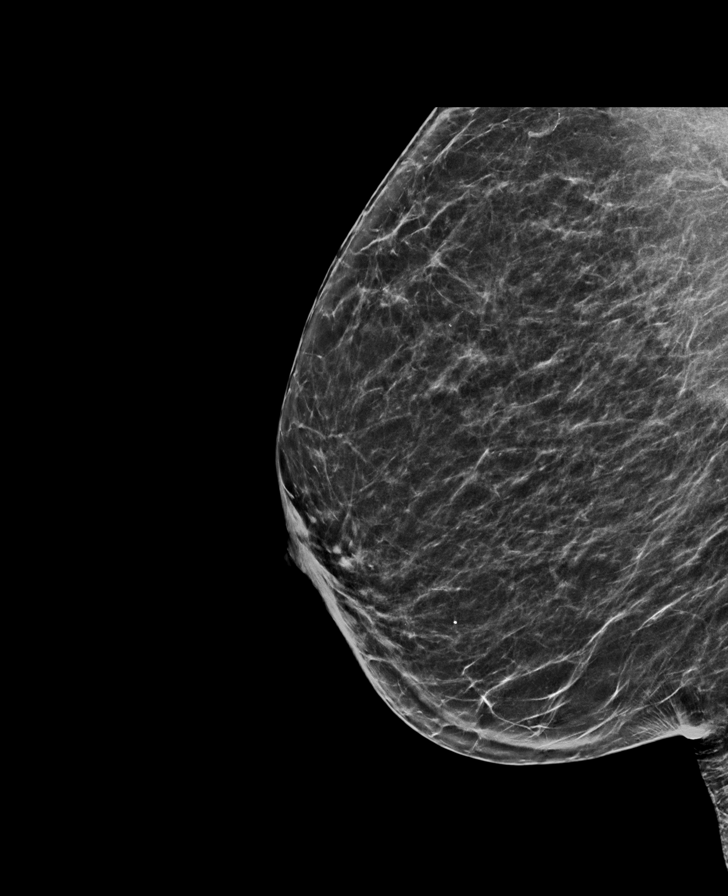

[L MLO synth-2D]
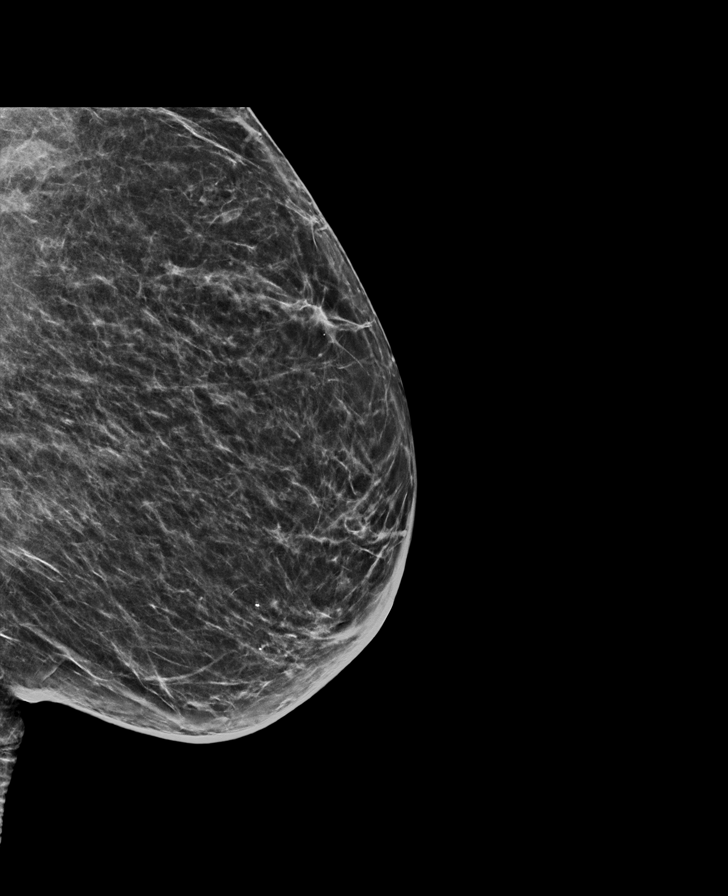

[R CC synth-2D]
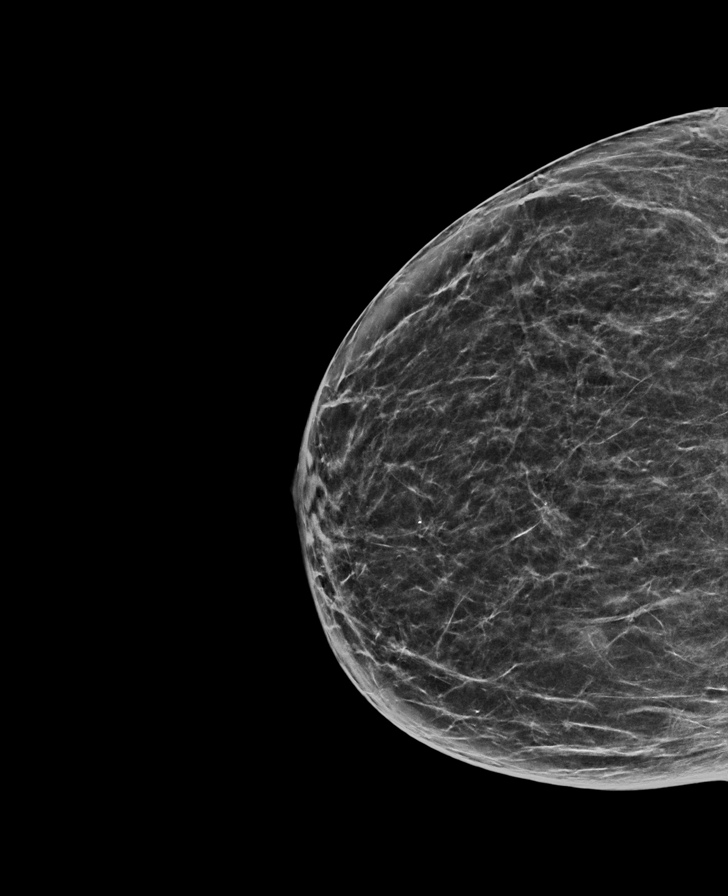

[L CC synth-2D]
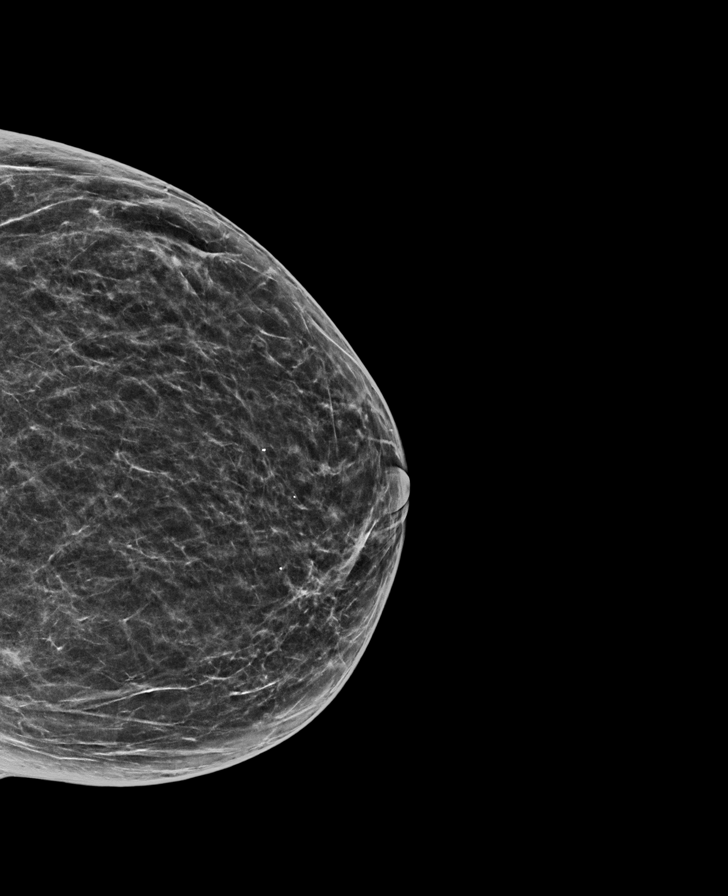

[L MLO tomo · tomo slice 35/69.0]
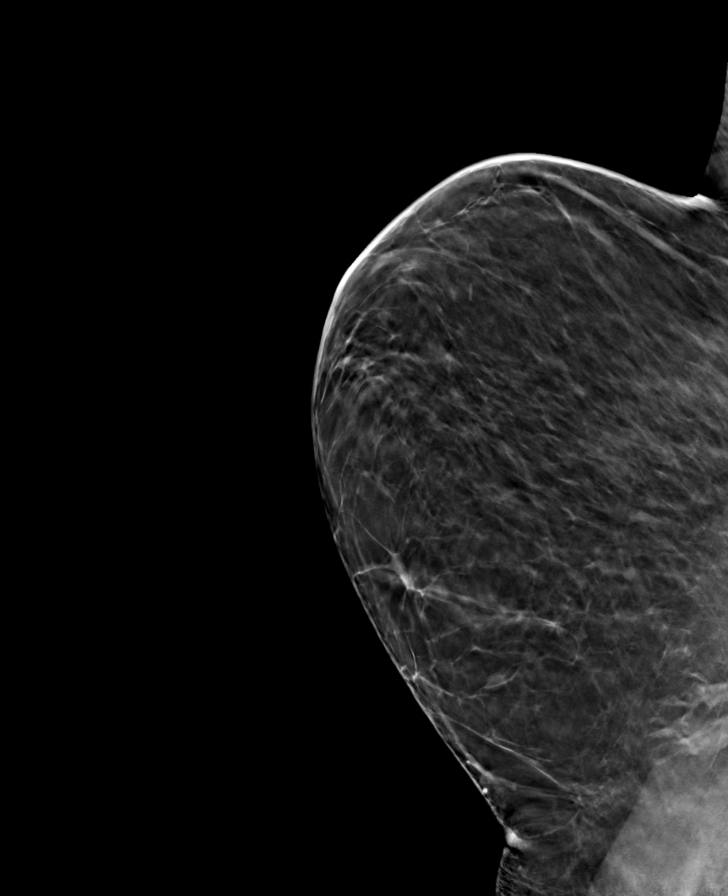

[R CC tomo · tomo slice 33/66.0]
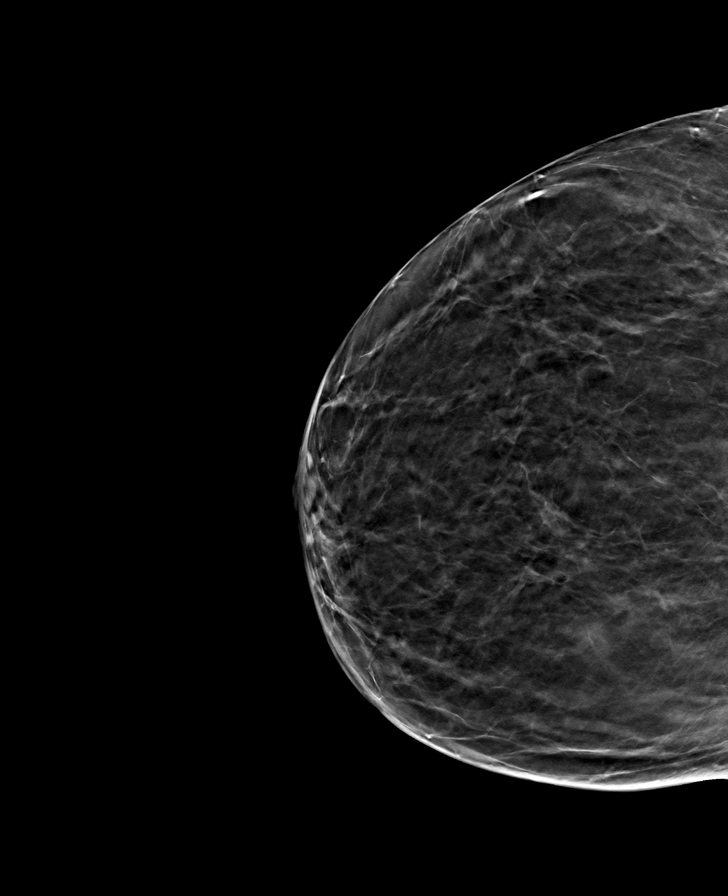

[L CC tomo · tomo slice 33/65.0]
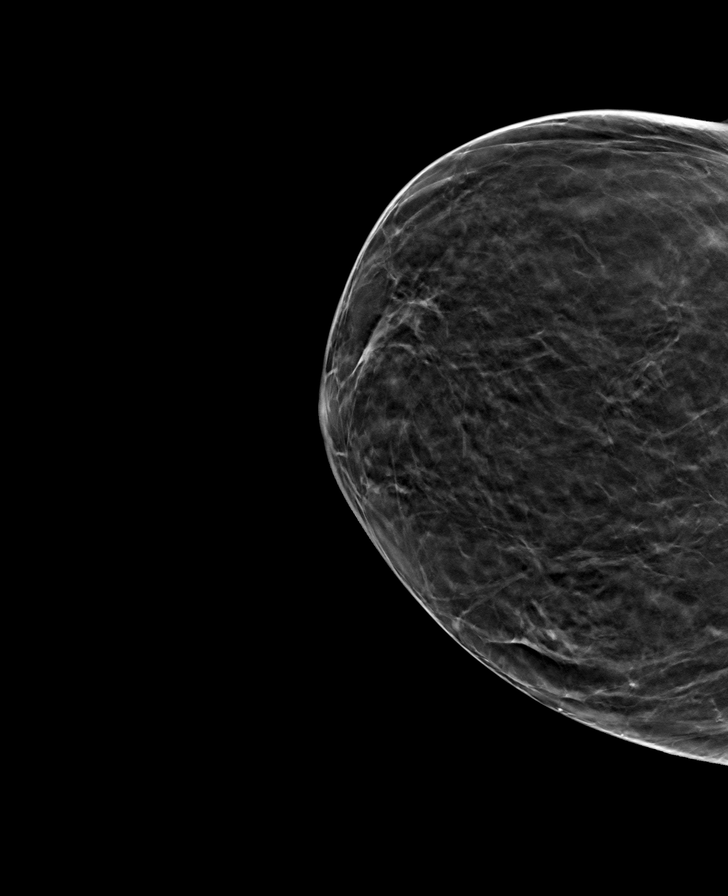

[R MLO tomo · tomo slice 33/66.0]
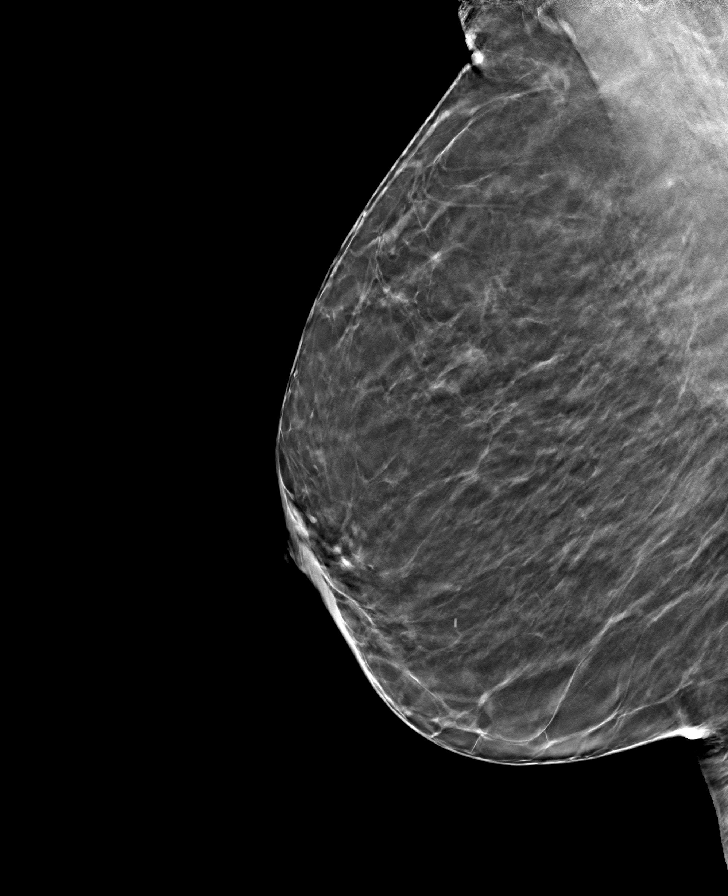

[8 of 24 positions shown; findings below may reference images not displayed]

ACR Breast Density Category b: There are scattered areas of
fibroglandular density.
FINDINGS: There are no findings suspicious for malignancy.
IMPRESSION: No mammographic evidence of malignancy. A result letter of this
screening mammogram will be mailed directly to the patient.

RECOMMENDATION:
Screening mammogram in one year. (Code:[BY])

BI-RADS CATEGORY  1: Negative.

## 2021-06-10 ENCOUNTER — Other Ambulatory Visit: Payer: Self-pay | Admitting: Internal Medicine

## 2021-06-10 DIAGNOSIS — R944 Abnormal results of kidney function studies: Secondary | ICD-10-CM

## 2021-06-10 NOTE — Progress Notes (Signed)
Order placed for f/u labs.  

## 2021-06-15 ENCOUNTER — Other Ambulatory Visit: Payer: Self-pay

## 2021-06-15 ENCOUNTER — Ambulatory Visit
Admission: RE | Admit: 2021-06-15 | Discharge: 2021-06-15 | Disposition: A | Discharge status: Home / Self Care | Payer: PPO | Source: Ambulatory Visit | Attending: Internal Medicine | Admitting: Internal Medicine

## 2021-06-15 DIAGNOSIS — N281 Cyst of kidney, acquired: Secondary | ICD-10-CM | POA: Diagnosis not present

## 2021-06-15 DIAGNOSIS — D1803 Hemangioma of intra-abdominal structures: Secondary | ICD-10-CM | POA: Insufficient documentation

## 2021-06-15 DIAGNOSIS — K7689 Other specified diseases of liver: Secondary | ICD-10-CM | POA: Diagnosis not present

## 2021-06-15 DIAGNOSIS — I700 Atherosclerosis of aorta: Secondary | ICD-10-CM | POA: Diagnosis not present

## 2021-06-15 IMAGING — MR MR ABDOMEN WO/W CM
17 series · 48 of 48 positions shown · IV contrast (6ml Gadavist)
Comparison: MRI dated [DATE]

CLINICAL DATA: Follow-up segment 2 lesion

EXAM:
MRI ABDOMEN WITHOUT AND WITH CONTRAST
TECHNIQUE: Multiplanar multisequence MR imaging of the abdomen was performed
both before and after the administration of intravenous contrast.
CONTRAST:  6mL GADAVIST GADOBUTROL 1 MMOL/ML IV SOLN

[Series 3: T2 · coronal · 6.0mm · 1.19mm/px · 2 of 30 slices shown (1 of 2)]
[im 1/30]
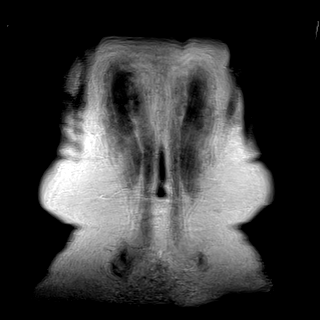
[im 30/30]
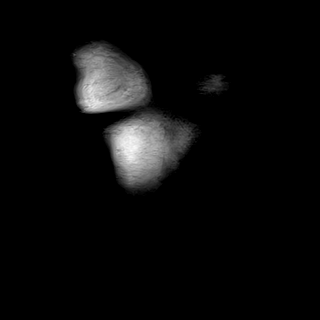

[Series 4: T2 · axial · 6.0mm · 1.19mm/px · z∈[-69,+155]mm · 2 of 32 slices shown (2 of 2)]
[im 1/32]
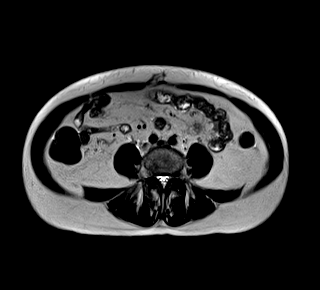
[im 32/32]
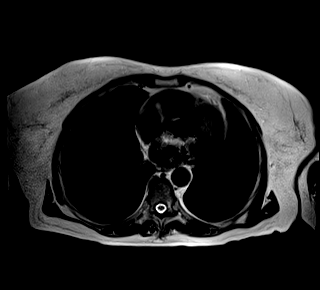

[Series 5: T1 · axial · 3.0mm · 1.19mm/px · z∈[-76,+161]mm · 4 of 80 slices shown (1 of 2)]
[im 1/80]
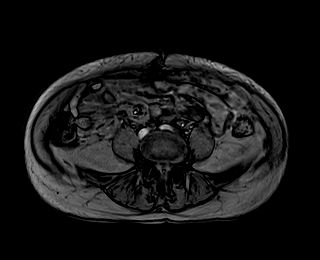
[im 27/80]
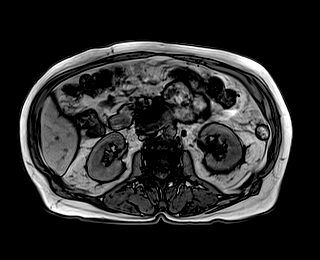
[im 53/80]
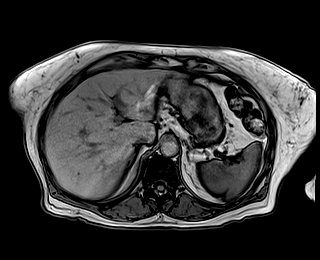
[im 80/80]
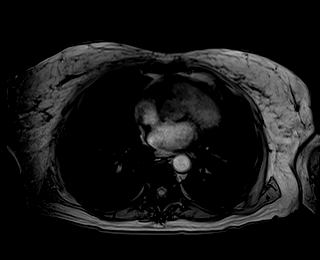

[Series 6: T1 · axial · 3.0mm · 1.19mm/px · z∈[-76,+161]mm · 4 of 80 slices shown (2 of 2)]
[im 1/80]
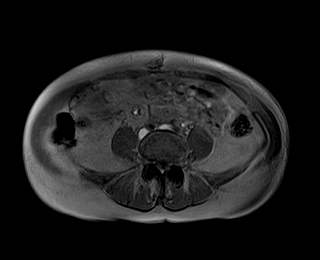
[im 27/80]
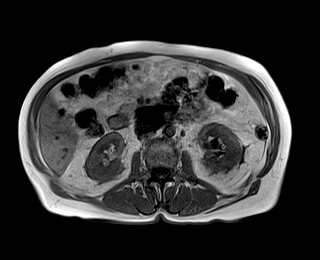
[im 53/80]
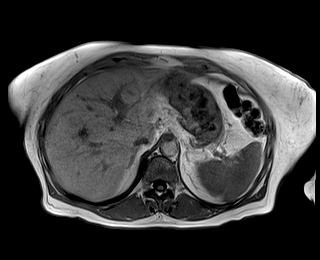
[im 80/80]
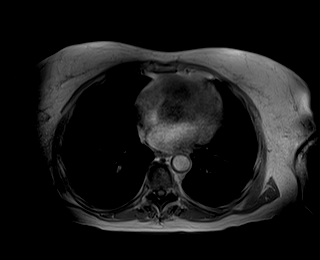

[Series 9: T2 fat-sat · axial · 6.0mm · 1.19mm/px · 1 of 34 slices shown]
[im 1/34]
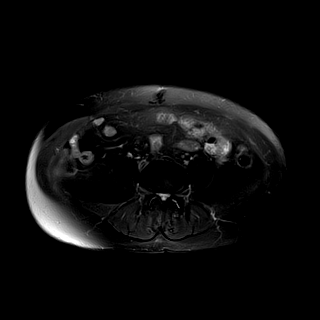

[Series 10: ax dwi_tracew · axial · 6.0mm · 1.42mm/px · z∈[-76,+162]mm · 4 of 102 slices shown]
[im 1/102]
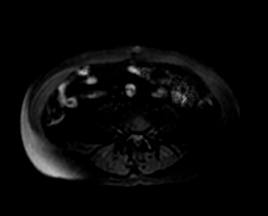
[im 34/102]
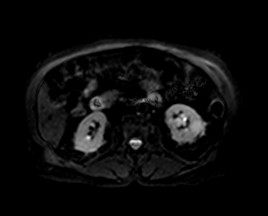
[im 68/102]
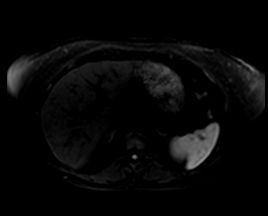
[im 102/102]
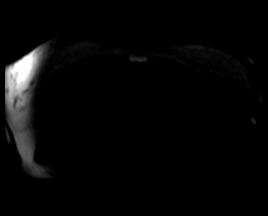

[Series 11: ax dwi_adc · axial · 6.0mm · 1.42mm/px · 1 of 34 slices shown]
[im 1/34]
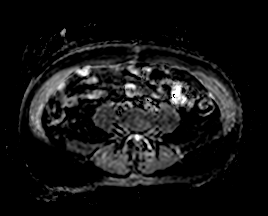

[Series 12: T1 dynamic fat-sat · axial · non-contrast · 3.3mm · 1.19mm/px · z∈[-74,+160]mm · 3 of 72 slices shown (1 of 5)]
[im 1/72]
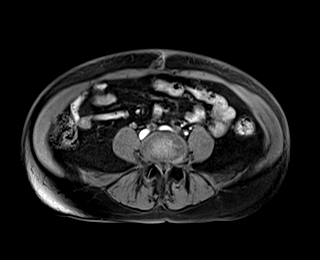
[im 36/72]
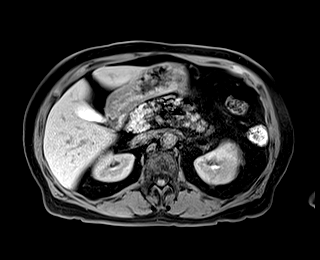
[im 72/72]
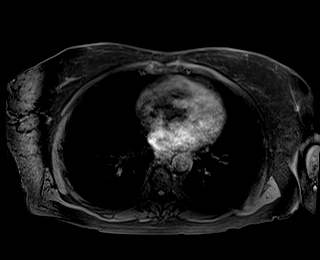

[Series 13: T1 dynamic fat-sat post-contrast · axial · 3.3mm · 1.19mm/px · z∈[-74,+160]mm · 3 of 72 slices shown (1 of 4)]
[im 1/72]
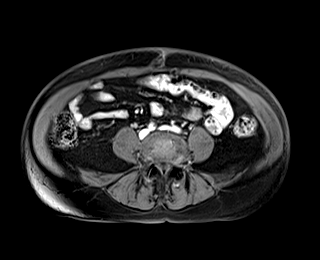
[im 36/72]
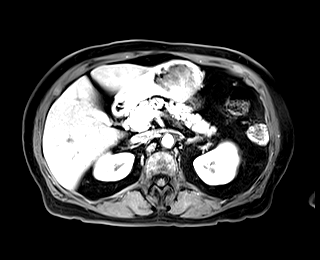
[im 72/72]
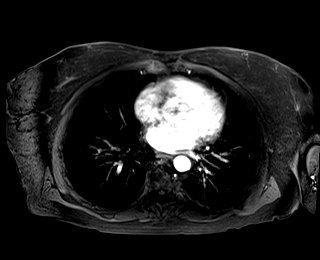

[Series 14: T1 dynamic fat-sat · axial · 3.3mm · 1.19mm/px · z∈[-74,+160]mm · 3 of 72 slices shown (2 of 5)]
[im 1/72]
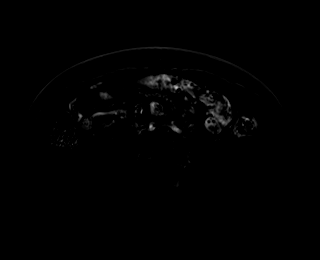
[im 36/72]
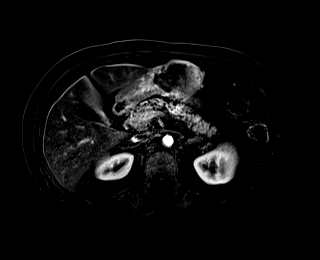
[im 72/72]
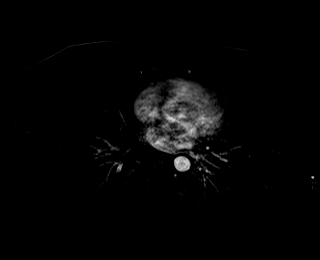

[Series 15: T1 dynamic fat-sat post-contrast · axial · 3.3mm · 1.19mm/px · z∈[-74,+160]mm · 3 of 72 slices shown (2 of 4)]
[im 1/72]
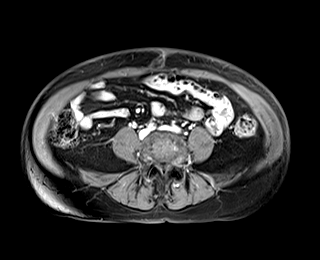
[im 36/72]
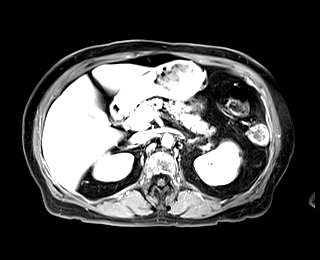
[im 72/72]
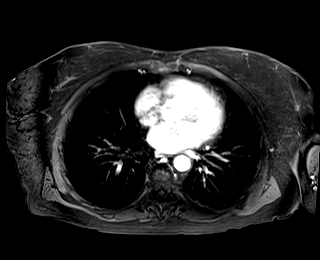

[Series 16: T1 dynamic fat-sat · axial · 3.3mm · 1.19mm/px · z∈[-74,+160]mm · 3 of 72 slices shown (3 of 5)]
[im 1/72]
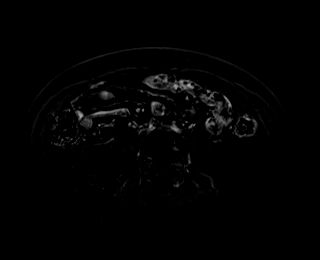
[im 36/72]
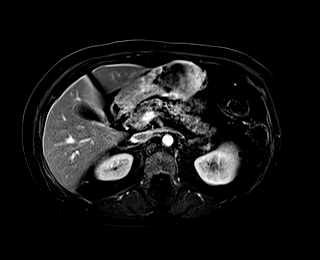
[im 72/72]
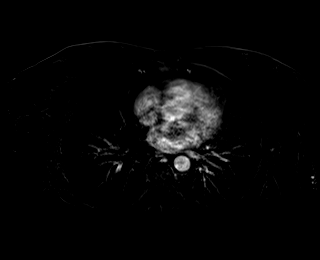

[Series 17: T1 dynamic fat-sat post-contrast · axial · 3.3mm · 1.19mm/px · z∈[-74,+160]mm · 3 of 72 slices shown (3 of 4)]
[im 1/72]
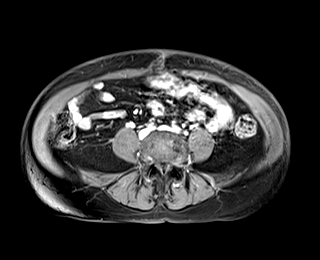
[im 36/72]
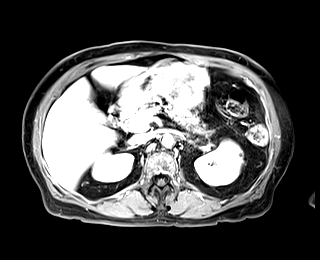
[im 72/72]
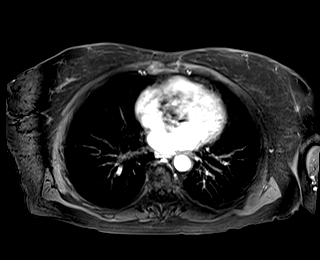

[Series 18: T1 dynamic fat-sat · axial · 3.3mm · 1.19mm/px · z∈[-74,+160]mm · 3 of 72 slices shown (4 of 5)]
[im 1/72]
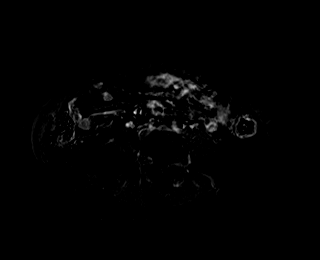
[im 36/72]
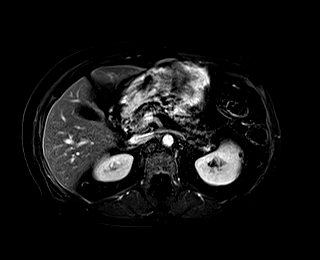
[im 72/72]
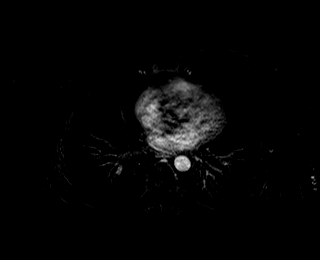

[Series 19: T1 dynamic post-contrast · coronal · 3.0mm · 1.31mm/px · 3 of 72 slices shown]
[im 1/72]
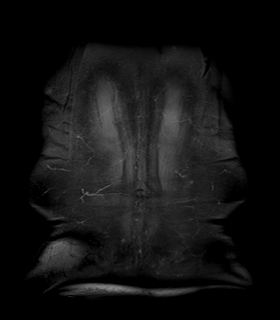
[im 36/72]
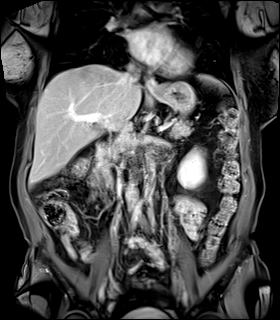
[im 72/72]
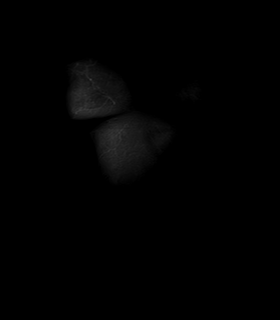

[Series 20: T1 dynamic fat-sat post-contrast · axial · 3.3mm · 1.19mm/px · z∈[-74,+160]mm · 3 of 72 slices shown (4 of 4)]
[im 1/72]
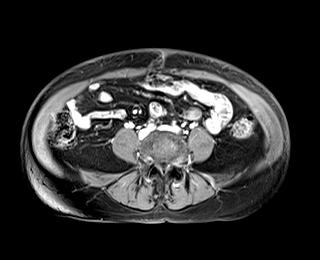
[im 36/72]
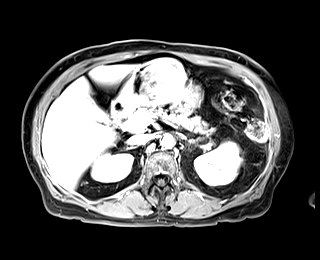
[im 72/72]
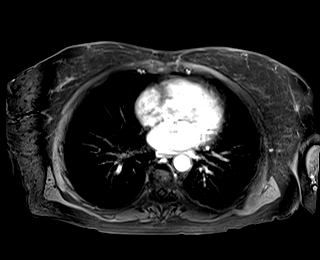

[Series 21: T1 dynamic fat-sat · axial · 3.3mm · 1.19mm/px · z∈[-74,+160]mm · 3 of 72 slices shown (5 of 5)]
[im 1/72]
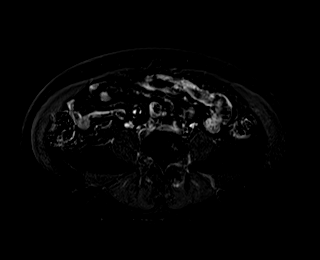
[im 36/72]
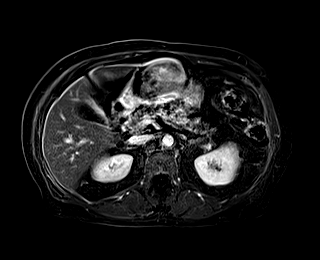
[im 72/72]
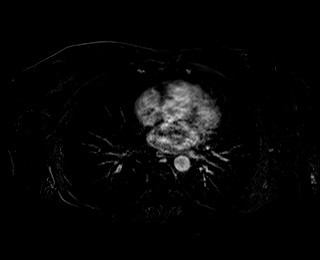

[48 of 48 positions shown; findings below may reference images not displayed]

FINDINGS: Lower chest: No acute findings.

Hepatobiliary: Heterogeneous lesion of segment II of the left liver
with restricted diffusion and enhancement on delayed imaging.
Difficult to measure on today's precontrast image. Component with
contrast enhancement is unchanged compared to prior exam, measuring
7 mm on series 20 image 21. Normal gallbladder. No biliary ductal
dilation.

Pancreas: No mass, inflammatory changes, or other parenchymal
abnormality identified.

Spleen:  Within normal limits in size and appearance.

Adrenals/Urinary Tract: Normal bilateral adrenal glands. Renal sinus
cysts of the left kidney simple cyst of the lower pole of the left
kidney. Bilateral T2 hyperintense renal lesions no evidence of
enhancement, compatible with simple cyst. Additional small bilateral
renal lesions are seen which are too small to completely
characterize.

Stomach/Bowel: Visualized portions within the abdomen are
unremarkable.

Vascular/Lymphatic: Atherosclerotic disease of the abdominal aorta.
No pathologically enlarged lymph nodes in the chest.

Other:  None.

Musculoskeletal: No suspicious bone lesions identified.
IMPRESSION: Stable appearance of heterogeneous lesion in segment II of the liver
with indeterminate imaging features, but unchanged dating back to
[DATE], possibly an atypical hemangioma. Follow-up MRI in 12
months is recommended to ensure continued stability.

## 2021-06-15 MED ORDER — GADOBUTROL 1 MMOL/ML IV SOLN
6.0000 mL | Freq: Once | INTRAVENOUS | Status: AC | PRN
  Administered 2021-06-15: 6 mL via INTRAVENOUS

## 2021-06-20 ENCOUNTER — Other Ambulatory Visit: Payer: Self-pay | Admitting: Internal Medicine

## 2021-06-23 ENCOUNTER — Telehealth: Payer: Self-pay | Admitting: *Deleted

## 2021-06-23 NOTE — Telephone Encounter (Signed)
-----   Message from Einar Pheasant, MD sent at 06/21/2021  3:45 PM EDT ----- Notify Ms Beames - MRI - stable.  No change from previous.  Radiology recommending f/u MRI in 12 months to ensure stability.

## 2021-06-23 NOTE — Telephone Encounter (Signed)
Left message to call office

## 2021-06-24 NOTE — Telephone Encounter (Signed)
Patient is returning your call,she would like for you to call her at her work number [phone].

## 2021-06-24 NOTE — Telephone Encounter (Signed)
Second attempt to contact patient

## 2021-06-24 NOTE — Telephone Encounter (Signed)
Patient aware of results.

## 2021-07-09 ENCOUNTER — Other Ambulatory Visit: Payer: Self-pay

## 2021-07-09 ENCOUNTER — Other Ambulatory Visit (INDEPENDENT_AMBULATORY_CARE_PROVIDER_SITE_OTHER): Payer: PPO

## 2021-07-09 DIAGNOSIS — R944 Abnormal results of kidney function studies: Secondary | ICD-10-CM

## 2021-07-09 LAB — BASIC METABOLIC PANEL
BUN: 35 mg/dL — ABNORMAL HIGH (ref 6–23)
CO2: 28 mEq/L (ref 19–32)
Calcium: 9.5 mg/dL (ref 8.4–10.5)
Chloride: 103 mEq/L (ref 96–112)
Creatinine, Ser: 1 mg/dL (ref 0.40–1.20)
GFR: 54.04 mL/min — ABNORMAL LOW (ref >60.00)
Glucose, Bld: 128 mg/dL — ABNORMAL HIGH (ref 70–99)
Potassium: 4 mEq/L (ref 3.5–5.1)
Sodium: 140 mEq/L (ref 135–145)

## 2021-07-09 LAB — URINALYSIS, ROUTINE W REFLEX MICROSCOPIC
Bilirubin Urine: NEGATIVE
Hgb urine dipstick: NEGATIVE
Ketones, ur: NEGATIVE
Leukocytes,Ua: NEGATIVE
Nitrite: NEGATIVE
Specific Gravity, Urine: 1.01 (ref 1.000–1.030)
Total Protein, Urine: NEGATIVE
Urine Glucose: NEGATIVE
Urobilinogen, UA: 0.2 (ref 0.0–1.0)
pH: 6 (ref 5.0–8.0)

## 2021-07-21 ENCOUNTER — Ambulatory Visit (INDEPENDENT_AMBULATORY_CARE_PROVIDER_SITE_OTHER): Payer: PPO | Admitting: Vascular Surgery

## 2021-07-21 ENCOUNTER — Encounter (INDEPENDENT_AMBULATORY_CARE_PROVIDER_SITE_OTHER): Payer: PPO

## 2021-07-28 ENCOUNTER — Other Ambulatory Visit: Payer: Self-pay

## 2021-07-28 ENCOUNTER — Encounter: Payer: Self-pay | Admitting: Internal Medicine

## 2021-07-28 ENCOUNTER — Ambulatory Visit (INDEPENDENT_AMBULATORY_CARE_PROVIDER_SITE_OTHER): Payer: PPO | Admitting: Internal Medicine

## 2021-07-28 VITALS — BP 134/78 | HR 76 | Temp 97.8°F | Resp 16 | Ht 62.0 in | Wt 132.6 lb

## 2021-07-28 DIAGNOSIS — E7800 Pure hypercholesterolemia, unspecified: Secondary | ICD-10-CM | POA: Diagnosis not present

## 2021-07-28 DIAGNOSIS — Z8371 Family history of colonic polyps: Secondary | ICD-10-CM

## 2021-07-28 DIAGNOSIS — I10 Essential (primary) hypertension: Secondary | ICD-10-CM | POA: Diagnosis not present

## 2021-07-28 DIAGNOSIS — Z23 Encounter for immunization: Secondary | ICD-10-CM | POA: Diagnosis not present

## 2021-07-28 DIAGNOSIS — E039 Hypothyroidism, unspecified: Secondary | ICD-10-CM | POA: Diagnosis not present

## 2021-07-28 DIAGNOSIS — I739 Peripheral vascular disease, unspecified: Secondary | ICD-10-CM

## 2021-07-28 DIAGNOSIS — D1803 Hemangioma of intra-abdominal structures: Secondary | ICD-10-CM

## 2021-07-28 DIAGNOSIS — E1165 Type 2 diabetes mellitus with hyperglycemia: Secondary | ICD-10-CM | POA: Diagnosis not present

## 2021-07-28 DIAGNOSIS — K219 Gastro-esophageal reflux disease without esophagitis: Secondary | ICD-10-CM | POA: Diagnosis not present

## 2021-07-28 MED ORDER — HYDRALAZINE HCL 50 MG PO TABS: 50.0000 mg | ORAL_TABLET | Freq: Two times a day (BID) | ORAL | 1 refills | Status: DC

## 2021-07-28 NOTE — Progress Notes (Signed)
Patient ID: Mariah Moody, female   DOB: 1941-10-30, 80 y.o.   MRN: [id number]   Subjective:    Patient ID: Mariah Moody, female    DOB: [birth year], 80 y.o.   MRN: [id number]  This visit occurred during the SARS-CoV-2 public health emergency.  Safety protocols were in place, including screening questions prior to the visit, additional usage of staff PPE, and extensive cleaning of exam room while observing appropriate contact time as indicated for disinfecting solutions.   Patient here for a scheduled follow up.   Chief Complaint  Patient presents with   Hypertension   Hypothyroidism   Gastroesophageal Reflux   .   HPI Here to follow up regarding her blood pressure, cholesterol and vascular issues.  Increased stress with work and her husband's health issues.  He was recently hospitalized with cardiac issue.  Doing better now.  Overall she feels she is handling things relatively well. Does not feel needs any further intervention.  No chest pain or sob.  Stays active at work.  No acid reflux or abdominal pain reported.  Still has to stop and rest with walking - due to legs.  Seeing Dr Delana Meyer.  Plans to f/u to complete vascular testing planned.  Off meloxicam.     Past Medical History:  Diagnosis Date   Anemia    Coronary atherosclerosis of native coronary vessel    Diabetes mellitus without complication (HCC)    Fibroid    GERD (gastroesophageal reflux disease)    History of chicken pox    Hyperlipidemia    Hypertension    Hypothyroidism    Past Surgical History:  Procedure Laterality Date   COLONOSCOPY N/A 04/15/2015   Procedure: COLONOSCOPY;  Surgeon: Hulen Luster, MD;  Location: [facility] ENDOSCOPY;  Service: Gastroenterology;  Laterality: N/A;   ESOPHAGOGASTRODUODENOSCOPY N/A 04/15/2015   Procedure: ESOPHAGOGASTRODUODENOSCOPY (EGD);  Surgeon: Hulen Luster, MD;  Location: [facility] ENDOSCOPY;  Service: Gastroenterology;  Laterality: N/A;   TUBAL LIGATION     Family History  Problem  Relation Age of Onset   Arthritis Mother    Hypertension Mother    Colon polyps Mother    Heart disease Father    Hypertension Father    Diabetes Brother    Cancer Neg Hx    Breast cancer Neg Hx    Social History   Socioeconomic History   Marital status: Married    Spouse name: Not on file   Number of children: Not on file   Years of education: Not on file   Highest education level: Not on file  Occupational History   Not on file  Tobacco Use   Smoking status: Former   Smokeless tobacco: Never  Substance and Sexual Activity   Alcohol use: Yes    Alcohol/week: 0.0 standard drinks    Comment: occas   Drug use: No   Sexual activity: Not Currently  Other Topics Concern   Not on file  Social History Narrative   Not on file   Social Determinants of Health   Financial Resource Strain: Not on file  Food Insecurity: Not on file  Transportation Needs: Not on file  Physical Activity: Not on file  Stress: Not on file  Social Connections: Not on file     Review of Systems  Constitutional:  Negative for appetite change and unexpected weight change.  HENT:  Negative for congestion and sinus pressure.   Respiratory:  Negative for cough, chest tightness and shortness of breath.  Cardiovascular:  Negative for chest pain, palpitations and leg swelling.  Gastrointestinal:  Negative for abdominal pain, diarrhea, nausea and vomiting.  Genitourinary:  Negative for difficulty urinating and dysuria.  Musculoskeletal:  Negative for joint swelling and myalgias.       Leg pain as outlined.   Skin:  Negative for color change and rash.  Neurological:  Negative for dizziness, light-headedness and headaches.  Psychiatric/Behavioral:  Negative for agitation and dysphoric mood.        Increased stress as outlined.        Objective:     BP (!) 152/90   Pulse 76   Temp 97.8 F (36.6 C)   Resp 16   Ht 5\' 2"  (1.575 m)   Wt 132 lb 9.6 oz (60.1 kg)   SpO2 99%   BMI 24.25 kg/m  Wt  Readings from Last 3 Encounters:  07/28/21 132 lb 9.6 oz (60.1 kg)  06/02/21 134 lb (60.8 kg)  04/14/21 132 lb (59.9 kg)    Physical Exam Vitals reviewed.  Constitutional:      General: She is not in acute distress.    Appearance: Normal appearance.  HENT:     Head: Normocephalic and atraumatic.     Right Ear: External ear normal.     Left Ear: External ear normal.  Eyes:     General: No scleral icterus.       Right eye: No discharge.        Left eye: No discharge.     Conjunctiva/sclera: Conjunctivae normal.  Neck:     Thyroid: No thyromegaly.  Cardiovascular:     Rate and Rhythm: Normal rate and regular rhythm.  Pulmonary:     Effort: No respiratory distress.     Breath sounds: Normal breath sounds. No wheezing.  Abdominal:     General: Bowel sounds are normal.     Palpations: Abdomen is soft.     Tenderness: There is no abdominal tenderness.  Musculoskeletal:        General: No swelling or tenderness.     Cervical back: Neck supple. No tenderness.  Lymphadenopathy:     Cervical: No cervical adenopathy.  Skin:    Findings: No erythema or rash.  Neurological:     Mental Status: She is alert.  Psychiatric:        Mood and Affect: Mood normal.        Behavior: Behavior normal.     Outpatient Encounter Medications as of 07/28/2021  Medication Sig   hydrALAZINE (APRESOLINE) 50 MG tablet Take 1 tablet (50 mg total) by mouth 2 (two) times daily.   Calcium Carbonate-Vitamin D (CALCIUM + D PO) Take 1 tablet by mouth daily.   estradiol-norethindrone (COMBIPATCH) 0.05-0.25 MG/DAY Place 1 patch onto the skin 2 (two) times a week.   hydrochlorothiazide (HYDRODIURIL) 25 MG tablet TAKE 1 TABLET BY MOUTH EVERY DAY   levothyroxine (SYNTHROID) 88 MCG tablet TAKE 1 TABLET (88 MCG TOTAL) DAILY BY MOUTH.   Multiple Vitamins-Minerals (MULTIVITAMIN PO) Take 1 tablet by mouth daily.    olmesartan (BENICAR) 40 MG tablet TAKE 1 TABLET BY MOUTH EVERY DAY   pantoprazole (PROTONIX) 40 MG  tablet TAKE 1 TABLET BY MOUTH EVERY DAY   [DISCONTINUED] Cholecalciferol (VITAMIN D PO) Take 1 capsule by mouth daily.  (Patient not taking: Reported on 06/02/2021)   [DISCONTINUED] hydrALAZINE (APRESOLINE) 25 MG tablet TAKE 1 TABLET BY MOUTH THREE TIMES A DAY   [DISCONTINUED] meloxicam (MOBIC) 7.5 MG tablet TAKE 1 TABLET  BY MOUTH EVERY DAY (Patient not taking: Reported on 07/28/2021)   No facility-administered encounter medications on file as of 07/28/2021.     Lab Results  Component Value Date   WBC 7.4 12/02/2020   HGB 12.5 12/02/2020   HCT 37.6 12/02/2020   PLT 323.0 12/02/2020   GLUCOSE 128 (H) 07/09/2021   CHOL 220 (H) 04/14/2021   TRIG 204.0 (H) 04/14/2021   HDL 36.60 (L) 04/14/2021   LDLDIRECT 139.0 04/14/2021   LDLCALC 139 (H) 12/02/2020   ALT 10 04/14/2021   AST 13 04/14/2021   NA 140 07/09/2021   K 4.0 07/09/2021   CL 103 07/09/2021   CREATININE 1.00 07/09/2021   BUN 35 (H) 07/09/2021   CO2 28 07/09/2021   TSH 2.60 12/02/2020   HGBA1C 6.3 04/14/2021   MICROALBUR 0.8 10/02/2019    MR Abdomen W Wo Contrast  Result Date: 06/16/2021 CLINICAL DATA:  Follow-up segment 2 lesion EXAM: MRI ABDOMEN WITHOUT AND WITH CONTRAST TECHNIQUE: Multiplanar multisequence MR imaging of the abdomen was performed both before and after the administration of intravenous contrast. CONTRAST:  4mL GADAVIST GADOBUTROL 1 MMOL/ML IV SOLN COMPARISON:  MRI dated December 22, 2020 FINDINGS: Lower chest: No acute findings. Hepatobiliary: Heterogeneous lesion of segment II of the left liver with restricted diffusion and enhancement on delayed imaging. Difficult to measure on today's precontrast image. Component with contrast enhancement is unchanged compared to prior exam, measuring 7 mm on series 20 image 21. Normal gallbladder. No biliary ductal dilation. Pancreas: No mass, inflammatory changes, or other parenchymal abnormality identified. Spleen:  Within normal limits in size and appearance. Adrenals/Urinary  Tract: Normal bilateral adrenal glands. Renal sinus cysts of the left kidney simple cyst of the lower pole of the left kidney. Bilateral T2 hyperintense renal lesions no evidence of enhancement, compatible with simple cyst. Additional small bilateral renal lesions are seen which are too small to completely characterize. Stomach/Bowel: Visualized portions within the abdomen are unremarkable. Vascular/Lymphatic: Atherosclerotic disease of the abdominal aorta. No pathologically enlarged lymph nodes in the chest. Other:  None. Musculoskeletal: No suspicious bone lesions identified. IMPRESSION: Stable appearance of heterogeneous lesion in segment II of the liver with indeterminate imaging features, but unchanged dating back to 04/23/2020, possibly an atypical hemangioma. Follow-up MRI in 12 months is recommended to ensure continued stability. Electronically Signed   By: Yetta Glassman M.D.   On: 06/16/2021 09:58       Assessment & Plan:   Problem List Items Addressed This Visit     Essential hypertension, benign    Blood pressure remains elevated.  Continue olmesartan, hctz and will increase hydralazine to 50mg  bid. She is forgetting to take the third dose of hydralazine.  Follow pressures.  Follow metabolic panel.       Relevant Medications   hydrALAZINE (APRESOLINE) 50 MG tablet   Other Relevant Orders   Basic metabolic panel   Hypercholesterolemia - Primary   Relevant Medications   hydrALAZINE (APRESOLINE) 50 MG tablet   Other Relevant Orders   Hepatic function panel   Lipid panel   Type 2 diabetes mellitus with hyperglycemia (HCC)   Relevant Orders   Hemoglobin A1c     Einar Pheasant, MD

## 2021-07-28 NOTE — Assessment & Plan Note (Signed)
Blood pressure remains elevated.  Continue olmesartan, hctz and will increase hydralazine to 50mg  bid. She is forgetting to take the third dose of hydralazine.  Follow pressures.  Follow metabolic panel.

## 2021-07-28 NOTE — Patient Instructions (Signed)
Change hydralazine to 50mg  twice a day.

## 2021-08-03 ENCOUNTER — Encounter: Payer: Self-pay | Admitting: Internal Medicine

## 2021-08-03 NOTE — Assessment & Plan Note (Signed)
Seeing Dr Delana Meyer - plans to complete vascular testing.  Continue risk factor modification.

## 2021-08-03 NOTE — Assessment & Plan Note (Signed)
Has declined cholesterol medication.  Continue low cholesterol diet and exercise.  Follow lipid panel.  

## 2021-08-03 NOTE — Assessment & Plan Note (Signed)
MRI 06/15/21 - unchanged.  Recommended f/u in 12 months.

## 2021-08-03 NOTE — Assessment & Plan Note (Signed)
No upper symptoms reported.  On protonix.   

## 2021-08-03 NOTE — Assessment & Plan Note (Signed)
On thyroid replacement.  Follow tsh.  

## 2021-08-03 NOTE — Assessment & Plan Note (Signed)
She watches what she eats.  Stays active.  Check metabolic panel and D6U.

## 2021-08-03 NOTE — Assessment & Plan Note (Signed)
Colonoscopy 04/2015 - entire colon normal.

## 2021-08-21 ENCOUNTER — Other Ambulatory Visit (INDEPENDENT_AMBULATORY_CARE_PROVIDER_SITE_OTHER): Payer: Self-pay | Admitting: Vascular Surgery

## 2021-08-21 DIAGNOSIS — I739 Peripheral vascular disease, unspecified: Secondary | ICD-10-CM

## 2021-08-21 DIAGNOSIS — R0989 Other specified symptoms and signs involving the circulatory and respiratory systems: Secondary | ICD-10-CM

## 2021-08-24 DIAGNOSIS — I6529 Occlusion and stenosis of unspecified carotid artery: Secondary | ICD-10-CM | POA: Insufficient documentation

## 2021-08-24 DIAGNOSIS — I779 Disorder of arteries and arterioles, unspecified: Secondary | ICD-10-CM | POA: Insufficient documentation

## 2021-08-24 NOTE — Progress Notes (Signed)
MRN : [id number]  Mariah Moody is a 80 y.o. (09/14/1941) female who presents with chief complaint of check circulation.  History of Present Illness:   The patient returns to the office for followup and review of the noninvasive studies. There have been no interval changes in lower extremity symptoms. No interval shortening of the patient's claudication distance or development of rest pain symptoms. No new ulcers or wounds have occurred since the last visit.  There have been no significant changes to the patient's overall health care.  The patient is also followed for carotid stenosis The patient denies amaurosis fugax or recent TIA symptoms. There are no recent neurological changes noted.  The patient denies history of DVT, PE or superficial thrombophlebitis. The patient denies recent episodes of angina or shortness of breath.   previous ABI's Rt=0.74 and Lt=0.74(There are triphasic Doppler signals bilaterally) Duplex ultrasound of the aortailiac system shows patent aorta iliac without hemodynamically stenosis.  Duplex ultrasound of the carotid arteries shows RICA 7-32% and LICA 2-02%.  No outpatient medications have been marked as taking for the 08/25/21 encounter (Appointment) with Delana Meyer, Dolores Lory, MD.    Past Medical History:  Diagnosis Date   Anemia    Coronary atherosclerosis of native coronary vessel    Diabetes mellitus without complication ([place])    Fibroid    GERD (gastroesophageal reflux disease)    History of chicken pox    Hyperlipidemia    Hypertension    Hypothyroidism     Past Surgical History:  Procedure Laterality Date   COLONOSCOPY N/A 04/15/2015   Procedure: COLONOSCOPY;  Surgeon: Hulen Luster, MD;  Location: [facility] ENDOSCOPY;  Service: Gastroenterology;  Laterality: N/A;   ESOPHAGOGASTRODUODENOSCOPY N/A 04/15/2015   Procedure: ESOPHAGOGASTRODUODENOSCOPY (EGD);  Surgeon: Hulen Luster, MD;  Location: [facility] ENDOSCOPY;  Service: Gastroenterology;   Laterality: N/A;   TUBAL LIGATION      Social History Social History   Tobacco Use   Smoking status: Former   Smokeless tobacco: Never  Substance Use Topics   Alcohol use: Yes    Alcohol/week: 0.0 standard drinks    Comment: occas   Drug use: No    Family History Family History  Problem Relation Age of Onset   Arthritis Mother    Hypertension Mother    Colon polyps Mother    Heart disease Father    Hypertension Father    Diabetes Brother    Cancer Neg Hx    Breast cancer Neg Hx     Allergies  Allergen Reactions   Statins     Other reaction(s): Muscle Pain   Sulfa Antibiotics     headaches   Iodine Rash     REVIEW OF SYSTEMS (Negative unless checked)  Constitutional: [] Weight loss  [] Fever  [] Chills Cardiac: [] Chest pain   [] Chest pressure   [] Palpitations   [] Shortness of breath when laying flat   [] Shortness of breath with exertion. Vascular:  [x] Pain in legs with walking   [] Pain in legs at rest  [] History of DVT   [] Phlebitis   [] Swelling in legs   [] Varicose veins   [] Non-healing ulcers Pulmonary:   [] Uses home oxygen   [] Productive cough   [] Hemoptysis   [] Wheeze  [] COPD   [] Asthma Neurologic:  [] Dizziness   [] Seizures   [] History of stroke   [] History of TIA  [] Aphasia   [] Vissual changes   [] Weakness or numbness in arm   [] Weakness or numbness in leg Musculoskeletal:   [] Joint swelling   []   Joint pain   [] Low back pain Hematologic:  [] Easy bruising  [] Easy bleeding   [] Hypercoagulable state   [] Anemic Gastrointestinal:  [] Diarrhea   [] Vomiting  [] Gastroesophageal reflux/heartburn   [] Difficulty swallowing. Genitourinary:  [] Chronic kidney disease   [] Difficult urination  [] Frequent urination   [] Blood in urine Skin:  [] Rashes   [] Ulcers  Psychological:  [] History of anxiety   []  History of major depression.  Physical Examination  There were no vitals filed for this visit. There is no height or weight on file to calculate BMI. Gen: WD/WN, NAD Head:  [place]/AT, No temporalis wasting.  Ear/Nose/Throat: Hearing grossly intact, nares w/o erythema or drainage Eyes: PER, EOMI, sclera nonicteric.  Neck: Supple, no masses.  No bruit or JVD.  Pulmonary:  Good air movement, no audible wheezing, no use of accessory muscles.  Cardiac: RRR, normal S1, S2, no Murmurs. Vascular:   carotid bruit noted Vessel Right Left  Radial Palpable Palpable  Carotid Palpable Palpable  PT Palpable Palpable  DP Palpable Palpable  Gastrointestinal: soft, non-distended. No guarding/no peritoneal signs.  Musculoskeletal: M/S 5/5 throughout.  No visible deformity.  Neurologic: CN 2-12 intact. Pain and light touch intact in extremities.  Symmetrical.  Speech is fluent. Motor exam as listed above. Psychiatric: Judgment intact, Mood & affect appropriate for pt's clinical situation. Dermatologic: No rashes or ulcers noted.  No changes consistent with cellulitis.   CBC Lab Results  Component Value Date   WBC 7.4 12/02/2020   HGB 12.5 12/02/2020   HCT 37.6 12/02/2020   MCV 91.8 12/02/2020   PLT 323.0 12/02/2020    BMET    Component Value Date/Time   NA 140 07/09/2021 0743   K 4.0 07/09/2021 0743   CL 103 07/09/2021 0743   CO2 28 07/09/2021 0743   GLUCOSE 128 (H) 07/09/2021 0743   BUN 35 (H) 07/09/2021 0743   CREATININE 1.00 07/09/2021 0743   CALCIUM 9.5 07/09/2021 0743   CrCl cannot be calculated (Patient's most recent lab result is older than the maximum 21 days allowed.).  COAG No results found for: INR, PROTIME  Radiology No results found.   Assessment/Plan 1. PAD (peripheral artery disease) (HCC)  Recommend:  The patient has evidence of atherosclerosis of the lower extremities with claudication.  The patient does not voice lifestyle limiting changes at this point in time.  Noninvasive studies do not suggest clinically significant change.  No invasive studies, angiography or surgery at this time The patient should continue walking and begin a  more formal exercise program.  The patient should continue antiplatelet therapy and aggressive treatment of the lipid abnormalities  No changes in the patient's medications at this time  The patient should continue wearing graduated compression socks 10-15 mmHg strength to control the mild edema.   - VAS [place] ABI WITH/WO TBI; Future  2. Stenosis of carotid artery, unspecified laterality Recommend:  Given the patient's asymptomatic subcritical stenosis no further invasive testing or surgery at this time.  Duplex ultrasound shows 1-39% stenosis bilaterally.  Continue antiplatelet therapy as prescribed Continue management of CAD, HTN and Hyperlipidemia Healthy heart diet,  encouraged exercise at least 4 times per week Follow up in 24 months with duplex ultrasound and physical exam    3. Essential hypertension, benign Continue antihypertensive medications as already ordered, these medications have been reviewed and there are no changes at this time.   4. Type 2 diabetes mellitus with hyperglycemia, without long-term current use of insulin ([place]) Continue hypoglycemic medications as already ordered, these  medications have been reviewed and there are no changes at this time.  Hgb A1C to be monitored as already arranged by primary service   5. Hypercholesterolemia Continue statin as ordered and reviewed, no changes at this time     Hortencia Pilar, MD  08/24/2021 11:00 AM

## 2021-08-25 ENCOUNTER — Other Ambulatory Visit: Payer: Self-pay

## 2021-08-25 ENCOUNTER — Ambulatory Visit (INDEPENDENT_AMBULATORY_CARE_PROVIDER_SITE_OTHER): Payer: PPO | Admitting: Vascular Surgery

## 2021-08-25 ENCOUNTER — Ambulatory Visit (INDEPENDENT_AMBULATORY_CARE_PROVIDER_SITE_OTHER): Payer: PPO

## 2021-08-25 ENCOUNTER — Encounter (INDEPENDENT_AMBULATORY_CARE_PROVIDER_SITE_OTHER): Payer: Self-pay | Admitting: Vascular Surgery

## 2021-08-25 VITALS — BP 156/70 | HR 69 | Ht 62.0 in | Wt 133.0 lb

## 2021-08-25 DIAGNOSIS — E1165 Type 2 diabetes mellitus with hyperglycemia: Secondary | ICD-10-CM | POA: Diagnosis not present

## 2021-08-25 DIAGNOSIS — I10 Essential (primary) hypertension: Secondary | ICD-10-CM

## 2021-08-25 DIAGNOSIS — I6529 Occlusion and stenosis of unspecified carotid artery: Secondary | ICD-10-CM | POA: Diagnosis not present

## 2021-08-25 DIAGNOSIS — I739 Peripheral vascular disease, unspecified: Secondary | ICD-10-CM

## 2021-08-25 DIAGNOSIS — R0989 Other specified symptoms and signs involving the circulatory and respiratory systems: Secondary | ICD-10-CM

## 2021-08-25 DIAGNOSIS — E7800 Pure hypercholesterolemia, unspecified: Secondary | ICD-10-CM | POA: Diagnosis not present

## 2021-08-30 ENCOUNTER — Encounter (INDEPENDENT_AMBULATORY_CARE_PROVIDER_SITE_OTHER): Payer: Self-pay | Admitting: Vascular Surgery

## 2021-09-10 ENCOUNTER — Other Ambulatory Visit: Payer: PPO

## 2021-09-12 ENCOUNTER — Other Ambulatory Visit: Payer: Self-pay

## 2021-09-12 ENCOUNTER — Encounter: Payer: Self-pay | Admitting: Internal Medicine

## 2021-09-12 ENCOUNTER — Ambulatory Visit (INDEPENDENT_AMBULATORY_CARE_PROVIDER_SITE_OTHER): Payer: PPO | Admitting: Internal Medicine

## 2021-09-12 VITALS — BP 138/80 | HR 70 | Temp 96.4°F | Ht 62.01 in | Wt 134.6 lb

## 2021-09-12 DIAGNOSIS — K219 Gastro-esophageal reflux disease without esophagitis: Secondary | ICD-10-CM | POA: Diagnosis not present

## 2021-09-12 DIAGNOSIS — D1803 Hemangioma of intra-abdominal structures: Secondary | ICD-10-CM | POA: Diagnosis not present

## 2021-09-12 DIAGNOSIS — E039 Hypothyroidism, unspecified: Secondary | ICD-10-CM

## 2021-09-12 DIAGNOSIS — I739 Peripheral vascular disease, unspecified: Secondary | ICD-10-CM | POA: Diagnosis not present

## 2021-09-12 DIAGNOSIS — I779 Disorder of arteries and arterioles, unspecified: Secondary | ICD-10-CM

## 2021-09-12 DIAGNOSIS — E1165 Type 2 diabetes mellitus with hyperglycemia: Secondary | ICD-10-CM | POA: Diagnosis not present

## 2021-09-12 DIAGNOSIS — E7800 Pure hypercholesterolemia, unspecified: Secondary | ICD-10-CM | POA: Diagnosis not present

## 2021-09-12 DIAGNOSIS — I10 Essential (primary) hypertension: Secondary | ICD-10-CM | POA: Diagnosis not present

## 2021-09-12 MED ORDER — SPIRONOLACTONE 25 MG PO TABS: ORAL_TABLET | ORAL | 1 refills | Status: DC

## 2021-09-12 NOTE — Progress Notes (Signed)
Patient ID: Mariah Moody, female   DOB: 1941/10/17, 80 y.o.   MRN: [id number]   Subjective:    Patient ID: Mariah Moody, female    DOB: November 10, 1940, 80 y.o.   MRN: [id number]  This visit occurred during the SARS-CoV-2 public health emergency.  Safety protocols were in place, including screening questions prior to the visit, additional usage of staff PPE, and extensive cleaning of exam room while observing appropriate contact time as indicated for disinfecting solutions.   Patient here for a scheduled follow up.   Chief Complaint  Patient presents with   Follow-up   Hypertension   .   HPI Working.  Stays busy.  No chest pain or sob.  Blood pressure remains elevated.  Unable to take hydralazine higher dose bid.  Makes her dizzy.  Has been taking q day.  No acid reflux or abdominal pain reported.  Bowels stable.  Overall appears to be handling stress relatively well.  Saw [facility] - f/u PAD.  Recommended compression hose and f/u with ABI in future.  Also f/u carotid duplex 24 months.     Past Medical History:  Diagnosis Date   Anemia    Coronary atherosclerosis of native coronary vessel    Diabetes mellitus without complication (HCC)    Fibroid    GERD (gastroesophageal reflux disease)    History of chicken pox    Hyperlipidemia    Hypertension    Hypothyroidism    Past Surgical History:  Procedure Laterality Date   COLONOSCOPY N/A 04/15/2015   Procedure: COLONOSCOPY;  Surgeon: Hulen Luster, MD;  Location: [facility] ENDOSCOPY;  Service: Gastroenterology;  Laterality: N/A;   ESOPHAGOGASTRODUODENOSCOPY N/A 04/15/2015   Procedure: ESOPHAGOGASTRODUODENOSCOPY (EGD);  Surgeon: Hulen Luster, MD;  Location: [facility] ENDOSCOPY;  Service: Gastroenterology;  Laterality: N/A;   TUBAL LIGATION     Family History  Problem Relation Age of Onset   Arthritis Mother    Hypertension Mother    Colon polyps Mother    Heart disease Father    Hypertension Father    Diabetes Brother    Cancer Neg Hx     Breast cancer Neg Hx    Social History   Socioeconomic History   Marital status: Married    Spouse name: Not on file   Number of children: Not on file   Years of education: Not on file   Highest education level: Not on file  Occupational History   Not on file  Tobacco Use   Smoking status: Former   Smokeless tobacco: Never  Substance and Sexual Activity   Alcohol use: Yes    Alcohol/week: 0.0 standard drinks    Comment: occas   Drug use: No   Sexual activity: Not Currently  Other Topics Concern   Not on file  Social History Narrative   Not on file   Social Determinants of Health   Financial Resource Strain: Not on file  Food Insecurity: Not on file  Transportation Needs: Not on file  Physical Activity: Not on file  Stress: Not on file  Social Connections: Not on file     Review of Systems  Constitutional:  Negative for appetite change and unexpected weight change.  HENT:  Negative for congestion and sinus pressure.   Respiratory:  Negative for cough, chest tightness and shortness of breath.   Cardiovascular:  Negative for chest pain, palpitations and leg swelling.  Gastrointestinal:  Negative for abdominal pain, diarrhea, nausea and vomiting.  Genitourinary:  Negative for difficulty  urinating and dysuria.  Musculoskeletal:  Negative for joint swelling and myalgias.  Skin:  Negative for color change and rash.  Neurological:  Negative for dizziness, light-headedness and headaches.  Psychiatric/Behavioral:  Negative for agitation and dysphoric mood.       Objective:     BP 138/80   Pulse 70   Temp (!) 96.4 F (35.8 C)   Ht 5' 2.01" (1.575 m)   Wt 134 lb 9.6 oz (61.1 kg)   SpO2 97%   BMI 24.61 kg/m  Wt Readings from Last 3 Encounters:  09/12/21 134 lb 9.6 oz (61.1 kg)  08/25/21 133 lb (60.3 kg)  07/28/21 132 lb 9.6 oz (60.1 kg)    Physical Exam Vitals reviewed.  Constitutional:      General: She is not in acute distress.    Appearance: Normal  appearance.  HENT:     Head: Normocephalic and atraumatic.     Right Ear: External ear normal.     Left Ear: External ear normal.  Eyes:     General: No scleral icterus.       Right eye: No discharge.        Left eye: No discharge.     Conjunctiva/sclera: Conjunctivae normal.  Neck:     Thyroid: No thyromegaly.  Cardiovascular:     Rate and Rhythm: Normal rate and regular rhythm.  Pulmonary:     Effort: No respiratory distress.     Breath sounds: Normal breath sounds. No wheezing.  Abdominal:     General: Bowel sounds are normal.     Palpations: Abdomen is soft.     Tenderness: There is no abdominal tenderness.  Musculoskeletal:        General: No swelling or tenderness.     Cervical back: Neck supple. No tenderness.  Lymphadenopathy:     Cervical: No cervical adenopathy.  Skin:    Findings: No erythema or rash.  Neurological:     Mental Status: She is alert.  Psychiatric:        Mood and Affect: Mood normal.        Behavior: Behavior normal.     Outpatient Encounter Medications as of 09/12/2021  Medication Sig   Calcium Carbonate-Vitamin D (CALCIUM + D PO) Take 1 tablet by mouth daily.   estradiol-norethindrone (COMBIPATCH) 0.05-0.25 MG/DAY Place 1 patch onto the skin 2 (two) times a week.   hydrochlorothiazide (HYDRODIURIL) 25 MG tablet TAKE 1 TABLET BY MOUTH EVERY DAY   Multiple Vitamins-Minerals (MULTIVITAMIN PO) Take 1 tablet by mouth daily.    pantoprazole (PROTONIX) 40 MG tablet TAKE 1 TABLET BY MOUTH EVERY DAY   spironolactone (ALDACTONE) 25 MG tablet 1/2 - 1 tablet q day   [DISCONTINUED] hydrALAZINE (APRESOLINE) 50 MG tablet Take 1 tablet (50 mg total) by mouth 2 (two) times daily.   [DISCONTINUED] levothyroxine (SYNTHROID) 88 MCG tablet TAKE 1 TABLET (88 MCG TOTAL) DAILY BY MOUTH.   [DISCONTINUED] olmesartan (BENICAR) 40 MG tablet TAKE 1 TABLET BY MOUTH EVERY DAY   No facility-administered encounter medications on file as of 09/12/2021.     Lab Results   Component Value Date   WBC 7.4 12/02/2020   HGB 12.5 12/02/2020   HCT 37.6 12/02/2020   PLT 323.0 12/02/2020   GLUCOSE 128 (H) 07/09/2021   CHOL 220 (H) 04/14/2021   TRIG 204.0 (H) 04/14/2021   HDL 36.60 (L) 04/14/2021   LDLDIRECT 139.0 04/14/2021   LDLCALC 139 (H) 12/02/2020   ALT 10 04/14/2021   AST  13 04/14/2021   NA 140 07/09/2021   K 4.0 07/09/2021   CL 103 07/09/2021   CREATININE 1.00 07/09/2021   BUN 35 (H) 07/09/2021   CO2 28 07/09/2021   TSH 2.60 12/02/2020   HGBA1C 6.3 04/14/2021   MICROALBUR 0.8 10/02/2019    MR Abdomen W Wo Contrast  Result Date: 06/16/2021 CLINICAL DATA:  Follow-up segment 2 lesion EXAM: MRI ABDOMEN WITHOUT AND WITH CONTRAST TECHNIQUE: Multiplanar multisequence MR imaging of the abdomen was performed both before and after the administration of intravenous contrast. CONTRAST:  37mL GADAVIST GADOBUTROL 1 MMOL/ML IV SOLN COMPARISON:  MRI dated December 22, 2020 FINDINGS: Lower chest: No acute findings. Hepatobiliary: Heterogeneous lesion of segment II of the left liver with restricted diffusion and enhancement on delayed imaging. Difficult to measure on today's precontrast image. Component with contrast enhancement is unchanged compared to prior exam, measuring 7 mm on series 20 image 21. Normal gallbladder. No biliary ductal dilation. Pancreas: No mass, inflammatory changes, or other parenchymal abnormality identified. Spleen:  Within normal limits in size and appearance. Adrenals/Urinary Tract: Normal bilateral adrenal glands. Renal sinus cysts of the left kidney simple cyst of the lower pole of the left kidney. Bilateral T2 hyperintense renal lesions no evidence of enhancement, compatible with simple cyst. Additional small bilateral renal lesions are seen which are too small to completely characterize. Stomach/Bowel: Visualized portions within the abdomen are unremarkable. Vascular/Lymphatic: Atherosclerotic disease of the abdominal aorta. No pathologically  enlarged lymph nodes in the chest. Other:  None. Musculoskeletal: No suspicious bone lesions identified. IMPRESSION: Stable appearance of heterogeneous lesion in segment II of the liver with indeterminate imaging features, but unchanged dating back to 04/23/2020, possibly an atypical hemangioma. Follow-up MRI in 12 months is recommended to ensure continued stability. Electronically Signed   By: Yetta Glassman M.D.   On: 06/16/2021 09:58       Assessment & Plan:   Problem List Items Addressed This Visit     Carotid artery disease ([place])    Just evaluated by Dr Delana Meyer.  Recommended f/u carotid ultrasound in 24 months.       Relevant Medications   spironolactone (ALDACTONE) 25 MG tablet   Essential hypertension, benign - Primary    Blood pressure remains elevated.  Continue olmesartan, hctz.  Stop hydralazine.  Had problems with amlodipine.  Intolerance to hydralazine.   Will stop hydralazine.  Trial of spironolactone 12.5mg  q day.  Follow pressures.  Follow metabolic panel.       Relevant Medications   spironolactone (ALDACTONE) 25 MG tablet   Other Relevant Orders   Basic metabolic panel   GERD (gastroesophageal reflux disease)    No upper symptoms reported.  On protonix.        Hemangioma    MRI 06/15/21 - unchanged.  Recommended f/u in 12 months.        Hypercholesterolemia    Has declined cholesterol medication.  Continue low cholesterol diet and exercise.  Follow lipid panel.       Relevant Medications   spironolactone (ALDACTONE) 25 MG tablet   Hypothyroidism    On thyroid replacement.  Follow tsh.       PAD (peripheral artery disease) (HCC)    Seeing Dr Delana Meyer - just evaluated.  Recommended f/u future ABIs.  Continue risk factor modification.       Relevant Medications   spironolactone (ALDACTONE) 25 MG tablet   Type 2 diabetes mellitus with hyperglycemia ([place])    She watches what she eats.  Stays active.  Check metabolic panel and W3S.         Einar Pheasant, MD

## 2021-09-17 ENCOUNTER — Other Ambulatory Visit: Payer: Self-pay | Admitting: Internal Medicine

## 2021-09-21 ENCOUNTER — Encounter: Payer: Self-pay | Admitting: Internal Medicine

## 2021-09-21 NOTE — Assessment & Plan Note (Signed)
Blood pressure remains elevated.  Continue olmesartan, hctz.  Stop hydralazine.  Had problems with amlodipine.  Intolerance to hydralazine.   Will stop hydralazine.  Trial of spironolactone 12.5mg  q day.  Follow pressures.  Follow metabolic panel.

## 2021-09-21 NOTE — Assessment & Plan Note (Signed)
MRI 06/15/21 - unchanged.  Recommended f/u in 12 months.

## 2021-09-21 NOTE — Assessment & Plan Note (Signed)
No upper symptoms reported.  On protonix.   

## 2021-09-21 NOTE — Assessment & Plan Note (Signed)
Has declined cholesterol medication.  Continue low cholesterol diet and exercise.  Follow lipid panel.  

## 2021-09-21 NOTE — Assessment & Plan Note (Signed)
On thyroid replacement.  Follow tsh.  

## 2021-09-21 NOTE — Assessment & Plan Note (Signed)
She watches what she eats.  Stays active.  Check metabolic panel and D6U.

## 2021-09-21 NOTE — Assessment & Plan Note (Signed)
Seeing Dr Delana Meyer - just evaluated.  Recommended f/u future ABIs.  Continue risk factor modification.

## 2021-09-21 NOTE — Assessment & Plan Note (Signed)
Just evaluated by Dr Delana Meyer.  Recommended f/u carotid ultrasound in 24 months.

## 2021-09-22 DIAGNOSIS — L570 Actinic keratosis: Secondary | ICD-10-CM | POA: Diagnosis not present

## 2021-09-22 DIAGNOSIS — L821 Other seborrheic keratosis: Secondary | ICD-10-CM | POA: Diagnosis not present

## 2021-10-05 ENCOUNTER — Other Ambulatory Visit: Payer: Self-pay | Admitting: Internal Medicine

## 2021-10-06 ENCOUNTER — Other Ambulatory Visit (INDEPENDENT_AMBULATORY_CARE_PROVIDER_SITE_OTHER): Payer: PPO

## 2021-10-06 ENCOUNTER — Telehealth: Payer: Self-pay | Admitting: Internal Medicine

## 2021-10-06 ENCOUNTER — Other Ambulatory Visit: Payer: Self-pay

## 2021-10-06 DIAGNOSIS — I10 Essential (primary) hypertension: Secondary | ICD-10-CM | POA: Diagnosis not present

## 2021-10-06 LAB — BASIC METABOLIC PANEL
BUN: 20 mg/dL (ref 6–23)
CO2: 27 mEq/L (ref 19–32)
Calcium: 9.2 mg/dL (ref 8.4–10.5)
Chloride: 106 mEq/L (ref 96–112)
Creatinine, Ser: 0.98 mg/dL (ref 0.40–1.20)
GFR: 55.27 mL/min — ABNORMAL LOW (ref >60.00)
Glucose, Bld: 190 mg/dL — ABNORMAL HIGH (ref 70–99)
Potassium: 4.1 mEq/L (ref 3.5–5.1)
Sodium: 141 mEq/L (ref 135–145)

## 2021-10-06 MED ORDER — COMBIPATCH 0.05-0.25 MG/DAY TD PTTW: 1.0000 | MEDICATED_PATCH | TRANSDERMAL | 1 refills | Status: DC

## 2021-10-06 NOTE — Telephone Encounter (Signed)
Patient is requesting a refill on her estradiol-norethindrone (COMBIPATCH) 0.05-0.25 MG/DAY

## 2021-10-06 NOTE — Telephone Encounter (Signed)
Refill sent.

## 2021-11-05 ENCOUNTER — Other Ambulatory Visit: Payer: Self-pay | Admitting: Internal Medicine

## 2021-11-19 ENCOUNTER — Other Ambulatory Visit: Payer: Self-pay | Admitting: Internal Medicine

## 2021-11-24 ENCOUNTER — Other Ambulatory Visit: Payer: Self-pay

## 2021-11-24 ENCOUNTER — Ambulatory Visit (INDEPENDENT_AMBULATORY_CARE_PROVIDER_SITE_OTHER): Payer: PPO | Admitting: Internal Medicine

## 2021-11-24 DIAGNOSIS — K219 Gastro-esophageal reflux disease without esophagitis: Secondary | ICD-10-CM | POA: Diagnosis not present

## 2021-11-24 DIAGNOSIS — I10 Essential (primary) hypertension: Secondary | ICD-10-CM | POA: Diagnosis not present

## 2021-11-24 DIAGNOSIS — J3489 Other specified disorders of nose and nasal sinuses: Secondary | ICD-10-CM | POA: Diagnosis not present

## 2021-11-24 DIAGNOSIS — E039 Hypothyroidism, unspecified: Secondary | ICD-10-CM | POA: Diagnosis not present

## 2021-11-24 DIAGNOSIS — I739 Peripheral vascular disease, unspecified: Secondary | ICD-10-CM

## 2021-11-24 DIAGNOSIS — I779 Disorder of arteries and arterioles, unspecified: Secondary | ICD-10-CM

## 2021-11-24 DIAGNOSIS — R1032 Left lower quadrant pain: Secondary | ICD-10-CM | POA: Diagnosis not present

## 2021-11-24 DIAGNOSIS — E7800 Pure hypercholesterolemia, unspecified: Secondary | ICD-10-CM | POA: Diagnosis not present

## 2021-11-24 DIAGNOSIS — E1165 Type 2 diabetes mellitus with hyperglycemia: Secondary | ICD-10-CM

## 2021-11-24 DIAGNOSIS — Z8371 Family history of colonic polyps: Secondary | ICD-10-CM | POA: Diagnosis not present

## 2021-11-24 MED ORDER — AZELASTINE HCL 0.1 % NA SOLN: 1.0000 | Freq: Two times a day (BID) | NASAL | 1 refills | Status: DC

## 2021-11-24 MED ORDER — SPIRONOLACTONE 25 MG PO TABS: 25.0000 mg | ORAL_TABLET | Freq: Every day | ORAL | 2 refills | Status: DC

## 2021-11-24 NOTE — Progress Notes (Signed)
Patient ID: Mariah Moody, female   DOB: 05/06/1941, 81 y.o.   MRN: [id number]   Subjective:    Patient ID: Mariah Moody, female    DOB: 12-08-1940, 81 y.o.   MRN: [id number]  This visit occurred during the SARS-CoV-2 public health emergency.  Safety protocols were in place, including screening questions prior to the visit, additional usage of staff PPE, and extensive cleaning of exam room while observing appropriate contact time as indicated for disinfecting solutions.   Patient here for a scheduled follow up.   Chief Complaint  Patient presents with   Hypertension   .   Hypertension Pertinent negatives include no chest pain, headaches, palpitations or shortness of breath.  Here to follow up regarding her blood pressure.  Last visit was started on 12.5mg  spironolactone.  Reviewed outside blood pressure readings.  Blood pressures 120s in pm (mostly).  140s in am.  She had a cold before Christmas.  Having residual drainage and dripping from her nose.  No chest congestion.  No increased cough.  No chest pain or sob.  Acid reflux controlled.  Steroid nasal sprays not working.  Request prescription nasal spray.  Has noticed some intermittent lower pelvic pain - left lower.  No change with bowel movements.     Past Medical History:  Diagnosis Date   Anemia    Coronary atherosclerosis of native coronary vessel    Diabetes mellitus without complication (HCC)    Fibroid    GERD (gastroesophageal reflux disease)    History of chicken pox    Hyperlipidemia    Hypertension    Hypothyroidism    Past Surgical History:  Procedure Laterality Date   COLONOSCOPY N/A 04/15/2015   Procedure: COLONOSCOPY;  Surgeon: Hulen Luster, MD;  Location: [facility] ENDOSCOPY;  Service: Gastroenterology;  Laterality: N/A;   ESOPHAGOGASTRODUODENOSCOPY N/A 04/15/2015   Procedure: ESOPHAGOGASTRODUODENOSCOPY (EGD);  Surgeon: Hulen Luster, MD;  Location: [facility] ENDOSCOPY;  Service: Gastroenterology;  Laterality: N/A;    TUBAL LIGATION     Family History  Problem Relation Age of Onset   Arthritis Mother    Hypertension Mother    Colon polyps Mother    Heart disease Father    Hypertension Father    Diabetes Brother    Cancer Neg Hx    Breast cancer Neg Hx    Social History   Socioeconomic History   Marital status: Married    Spouse name: Not on file   Number of children: Not on file   Years of education: Not on file   Highest education level: Not on file  Occupational History   Not on file  Tobacco Use   Smoking status: Former   Smokeless tobacco: Never  Substance and Sexual Activity   Alcohol use: Yes    Alcohol/week: 0.0 standard drinks    Comment: occas   Drug use: No   Sexual activity: Not Currently  Other Topics Concern   Not on file  Social History Narrative   Not on file   Social Determinants of Health   Financial Resource Strain: Not on file  Food Insecurity: Not on file  Transportation Needs: Not on file  Physical Activity: Not on file  Stress: Not on file  Social Connections: Not on file     Review of Systems  Constitutional:  Negative for appetite change and unexpected weight change.  HENT:  Positive for postnasal drip. Negative for congestion and sinus pressure.   Respiratory:  Negative for cough, chest tightness  and shortness of breath.   Cardiovascular:  Negative for chest pain, palpitations and leg swelling.  Gastrointestinal:  Negative for diarrhea, nausea and vomiting.       Left lower pelvic pain.   Genitourinary:  Negative for difficulty urinating and dysuria.  Musculoskeletal:  Negative for joint swelling and myalgias.  Skin:  Negative for color change and rash.  Neurological:  Negative for dizziness, light-headedness and headaches.  Psychiatric/Behavioral:  Negative for agitation and dysphoric mood.       Objective:     BP (!) 148/88    Pulse 78    Temp 98 F (36.7 C)    Resp 16    Ht 5\' 2"  (1.575 m)    Wt 133 lb 9.6 oz (60.6 kg)    SpO2 97%    BMI  24.44 kg/m  Wt Readings from Last 3 Encounters:  11/24/21 133 lb 9.6 oz (60.6 kg)  09/12/21 134 lb 9.6 oz (61.1 kg)  08/25/21 133 lb (60.3 kg)    Physical Exam Vitals reviewed.  Constitutional:      General: She is not in acute distress.    Appearance: Normal appearance.  HENT:     Head: Normocephalic and atraumatic.     Right Ear: External ear normal.     Left Ear: External ear normal.  Eyes:     General: No scleral icterus.       Right eye: No discharge.        Left eye: No discharge.     Conjunctiva/sclera: Conjunctivae normal.  Neck:     Thyroid: No thyromegaly.  Cardiovascular:     Rate and Rhythm: Normal rate and regular rhythm.  Pulmonary:     Effort: No respiratory distress.     Breath sounds: Normal breath sounds. No wheezing.  Abdominal:     General: Bowel sounds are normal.     Palpations: Abdomen is soft.     Comments: Left lower quadrant pain with palpation.   Musculoskeletal:        General: No swelling or tenderness.     Cervical back: Neck supple. No tenderness.  Lymphadenopathy:     Cervical: No cervical adenopathy.  Skin:    Findings: No erythema or rash.  Neurological:     Mental Status: She is alert.  Psychiatric:        Mood and Affect: Mood normal.        Behavior: Behavior normal.     Outpatient Encounter Medications as of 11/24/2021  Medication Sig   azelastine (ASTELIN) 0.1 % nasal spray Place 1 spray into both nostrils 2 (two) times daily. Use in each nostril as directed   Calcium Carbonate-Vitamin D (CALCIUM + D PO) Take 1 tablet by mouth daily.   estradiol-norethindrone (COMBIPATCH) 0.05-0.25 MG/DAY Place 1 patch onto the skin 2 (two) times a week.   hydrochlorothiazide (HYDRODIURIL) 25 MG tablet TAKE 1 TABLET BY MOUTH EVERY DAY   levothyroxine (SYNTHROID) 88 MCG tablet TAKE 1 TABLET (88 MCG TOTAL) DAILY BY MOUTH.   Multiple Vitamins-Minerals (MULTIVITAMIN PO) Take 1 tablet by mouth daily.    olmesartan (BENICAR) 40 MG tablet TAKE 1  TABLET BY MOUTH EVERY DAY   pantoprazole (PROTONIX) 40 MG tablet TAKE 1 TABLET BY MOUTH EVERY DAY   spironolactone (ALDACTONE) 25 MG tablet Take 1 tablet (25 mg total) by mouth daily.   [DISCONTINUED] spironolactone (ALDACTONE) 25 MG tablet TAKE 1/2 - 1 TABLET EVERY DAY   No facility-administered encounter medications on file as  of 11/24/2021.     Lab Results  Component Value Date   WBC 7.4 12/02/2020   HGB 12.5 12/02/2020   HCT 37.6 12/02/2020   PLT 323.0 12/02/2020   GLUCOSE 190 (H) 10/06/2021   CHOL 220 (H) 04/14/2021   TRIG 204.0 (H) 04/14/2021   HDL 36.60 (L) 04/14/2021   LDLDIRECT 139.0 04/14/2021   LDLCALC 139 (H) 12/02/2020   ALT 10 04/14/2021   AST 13 04/14/2021   NA 141 10/06/2021   K 4.1 10/06/2021   CL 106 10/06/2021   CREATININE 0.98 10/06/2021   BUN 20 10/06/2021   CO2 27 10/06/2021   TSH 2.60 12/02/2020   HGBA1C 6.3 04/14/2021   MICROALBUR 0.8 10/02/2019    MR Abdomen W Wo Contrast  Result Date: 06/16/2021 CLINICAL DATA:  Follow-up segment 2 lesion EXAM: MRI ABDOMEN WITHOUT AND WITH CONTRAST TECHNIQUE: Multiplanar multisequence MR imaging of the abdomen was performed both before and after the administration of intravenous contrast. CONTRAST:  45mL GADAVIST GADOBUTROL 1 MMOL/ML IV SOLN COMPARISON:  MRI dated December 22, 2020 FINDINGS: Lower chest: No acute findings. Hepatobiliary: Heterogeneous lesion of segment II of the left liver with restricted diffusion and enhancement on delayed imaging. Difficult to measure on today's precontrast image. Component with contrast enhancement is unchanged compared to prior exam, measuring 7 mm on series 20 image 21. Normal gallbladder. No biliary ductal dilation. Pancreas: No mass, inflammatory changes, or other parenchymal abnormality identified. Spleen:  Within normal limits in size and appearance. Adrenals/Urinary Tract: Normal bilateral adrenal glands. Renal sinus cysts of the left kidney simple cyst of the lower pole of the  left kidney. Bilateral T2 hyperintense renal lesions no evidence of enhancement, compatible with simple cyst. Additional small bilateral renal lesions are seen which are too small to completely characterize. Stomach/Bowel: Visualized portions within the abdomen are unremarkable. Vascular/Lymphatic: Atherosclerotic disease of the abdominal aorta. No pathologically enlarged lymph nodes in the chest. Other:  None. Musculoskeletal: No suspicious bone lesions identified. IMPRESSION: Stable appearance of heterogeneous lesion in segment II of the liver with indeterminate imaging features, but unchanged dating back to 04/23/2020, possibly an atypical hemangioma. Follow-up MRI in 12 months is recommended to ensure continued stability. Electronically Signed   By: Yetta Glassman M.D.   On: 06/16/2021 09:58       Assessment & Plan:   Problem List Items Addressed This Visit     Carotid artery disease ([place])    Evaluated by Dr Delana Meyer.  Recommended f/u carotid ultrasound in 24 months.       Relevant Medications   spironolactone (ALDACTONE) 25 MG tablet   Drainage from nose    Persistent.  Has tried steroid nasal psray.  Trial of astelin nasal spray.  Follow.  Call with update.       Essential hypertension, benign    Blood pressure remains elevated.  Continue olmesartan, hctz.  Stop hydralazine.  Had problems with amlodipine.  Intolerance to hydralazine.  Started on spironolactone 12.5mg  q day.  Blood pressure improved, but remains elevated.  Will increase to 25mg  q day. Follow pressures.  Follow metabolic panel.       Relevant Medications   spironolactone (ALDACTONE) 25 MG tablet   Family history of colonic polyps    Colonoscopy 04/2015 - entire colon normal.       GERD (gastroesophageal reflux disease)    No upper symptoms reported.  On protonix.        Hypercholesterolemia    Has declined cholesterol medication.  Continue  low cholesterol diet and exercise.  Follow lipid panel.       Relevant  Medications   spironolactone (ALDACTONE) 25 MG tablet   Hypothyroidism    On thyroid replacement.  Follow tsh.       Left lower quadrant pain    Persistent.  History of uterine fibroid.  Schedule pelvic ultrasound.       Relevant Orders   US Pelvic Complete With Transvaginal   PAD (peripheral artery disease) (HCC)    Seeing Dr Delana Meyer - just evaluated.  Recommended f/u future ABIs.  Continue risk factor modification.       Relevant Medications   spironolactone (ALDACTONE) 25 MG tablet   Type 2 diabetes mellitus with hyperglycemia ([place])    She watches what she eats.  Low carb diet and exercise.  Stays active.  Check metabolic panel and P9I.         Einar Pheasant, MD

## 2021-11-30 ENCOUNTER — Encounter: Payer: Self-pay | Admitting: Internal Medicine

## 2021-11-30 DIAGNOSIS — J3489 Other specified disorders of nose and nasal sinuses: Secondary | ICD-10-CM | POA: Insufficient documentation

## 2021-11-30 DIAGNOSIS — R1032 Left lower quadrant pain: Secondary | ICD-10-CM | POA: Insufficient documentation

## 2021-11-30 NOTE — Assessment & Plan Note (Signed)
Has declined cholesterol medication.  Continue low cholesterol diet and exercise.  Follow lipid panel.  

## 2021-11-30 NOTE — Assessment & Plan Note (Signed)
Seeing Dr Delana Meyer - just evaluated.  Recommended f/u future ABIs.  Continue risk factor modification.

## 2021-11-30 NOTE — Assessment & Plan Note (Signed)
Evaluated by Dr Delana Meyer.  Recommended f/u carotid ultrasound in 24 months.

## 2021-11-30 NOTE — Assessment & Plan Note (Signed)
Colonoscopy 04/2015 - entire colon normal.

## 2021-11-30 NOTE — Assessment & Plan Note (Signed)
On thyroid replacement.  Follow tsh.  

## 2021-11-30 NOTE — Assessment & Plan Note (Signed)
She watches what she eats.  Low carb diet and exercise.  Stays active.  Check metabolic panel and a1c.  

## 2021-11-30 NOTE — Assessment & Plan Note (Signed)
Blood pressure remains elevated.  Continue olmesartan, hctz.  Stop hydralazine.  Had problems with amlodipine.  Intolerance to hydralazine.  Started on spironolactone 12.5mg  q day.  Blood pressure improved, but remains elevated.  Will increase to 25mg  q day. Follow pressures.  Follow metabolic panel.

## 2021-11-30 NOTE — Assessment & Plan Note (Signed)
Persistent.  Has tried steroid nasal psray.  Trial of astelin nasal spray.  Follow.  Call with update.

## 2021-11-30 NOTE — Assessment & Plan Note (Signed)
No upper symptoms reported.  On protonix.   

## 2021-11-30 NOTE — Assessment & Plan Note (Signed)
Persistent.  History of uterine fibroid.  Schedule pelvic ultrasound.

## 2021-12-02 ENCOUNTER — Other Ambulatory Visit: Payer: Self-pay | Admitting: Internal Medicine

## 2021-12-08 ENCOUNTER — Other Ambulatory Visit: Payer: Self-pay | Admitting: Internal Medicine

## 2021-12-08 ENCOUNTER — Other Ambulatory Visit: Payer: Self-pay

## 2021-12-08 ENCOUNTER — Ambulatory Visit
Admission: RE | Admit: 2021-12-08 | Discharge: 2021-12-08 | Disposition: A | Discharge status: Home / Self Care | Payer: PPO | Source: Ambulatory Visit | Attending: Internal Medicine | Admitting: Internal Medicine

## 2021-12-08 DIAGNOSIS — R1032 Left lower quadrant pain: Secondary | ICD-10-CM | POA: Insufficient documentation

## 2021-12-08 DIAGNOSIS — R9389 Abnormal findings on diagnostic imaging of other specified body structures: Secondary | ICD-10-CM

## 2021-12-08 DIAGNOSIS — N8500 Endometrial hyperplasia, unspecified: Secondary | ICD-10-CM | POA: Diagnosis not present

## 2021-12-08 DIAGNOSIS — D259 Leiomyoma of uterus, unspecified: Secondary | ICD-10-CM | POA: Diagnosis not present

## 2021-12-08 NOTE — Progress Notes (Signed)
Order placed for referral to gyn.  

## 2021-12-14 ENCOUNTER — Other Ambulatory Visit: Payer: Self-pay | Admitting: Internal Medicine

## 2021-12-17 ENCOUNTER — Other Ambulatory Visit: Payer: Self-pay | Admitting: Internal Medicine

## 2021-12-29 ENCOUNTER — Other Ambulatory Visit: Payer: Self-pay

## 2021-12-29 ENCOUNTER — Other Ambulatory Visit (INDEPENDENT_AMBULATORY_CARE_PROVIDER_SITE_OTHER): Payer: PPO

## 2021-12-29 DIAGNOSIS — E1165 Type 2 diabetes mellitus with hyperglycemia: Secondary | ICD-10-CM

## 2021-12-29 DIAGNOSIS — I10 Essential (primary) hypertension: Secondary | ICD-10-CM

## 2021-12-29 DIAGNOSIS — E7800 Pure hypercholesterolemia, unspecified: Secondary | ICD-10-CM | POA: Diagnosis not present

## 2021-12-29 LAB — LIPID PANEL
Cholesterol: 256 mg/dL — ABNORMAL HIGH (ref 0–200)
HDL: 32.8 mg/dL — ABNORMAL LOW
NonHDL: 223.68
Total CHOL/HDL Ratio: 8
Triglycerides: 343 mg/dL — ABNORMAL HIGH (ref 0.0–149.0)
VLDL: 68.6 mg/dL — ABNORMAL HIGH (ref 0.0–40.0)

## 2021-12-29 LAB — LDL CHOLESTEROL, DIRECT: Direct LDL: 133 mg/dL

## 2021-12-29 LAB — HEPATIC FUNCTION PANEL
ALT: 9 U/L (ref 0–35)
AST: 14 U/L (ref 0–37)
Albumin: 4.4 g/dL (ref 3.5–5.2)
Alkaline Phosphatase: 68 U/L (ref 39–117)
Bilirubin, Direct: 0.1 mg/dL (ref 0.0–0.3)
Total Bilirubin: 1 mg/dL (ref 0.2–1.2)
Total Protein: 6.6 g/dL (ref 6.0–8.3)

## 2021-12-29 LAB — BASIC METABOLIC PANEL
BUN: 35 mg/dL — ABNORMAL HIGH (ref 6–23)
CO2: 27 mEq/L (ref 19–32)
Calcium: 10.1 mg/dL (ref 8.4–10.5)
Chloride: 105 mEq/L (ref 96–112)
Creatinine, Ser: 1.21 mg/dL — ABNORMAL HIGH (ref 0.40–1.20)
GFR: 42.84 mL/min — ABNORMAL LOW (ref >60.00)
Glucose, Bld: 137 mg/dL — ABNORMAL HIGH (ref 70–99)
Potassium: 4.4 mEq/L (ref 3.5–5.1)
Sodium: 140 mEq/L (ref 135–145)

## 2021-12-29 LAB — HEMOGLOBIN A1C: Hgb A1c MFr Bld: 6.9 % — ABNORMAL HIGH (ref 4.6–6.5)

## 2022-01-02 ENCOUNTER — Telehealth: Payer: Self-pay

## 2022-01-02 NOTE — Telephone Encounter (Signed)
-----  Message from Einar Pheasant, MD sent at 12/30/2021  2:12 AM EST ----- ?Notify Mariah Moody that her overall sugar control has increased from last couple of checks.  Continue a low carb diet and exercise.  We will follow.  Her cholesterol remains elevated as well.  She has previously declined cholesterol medication.  If changes her mind, let me know.  Continue diet and exercise as outlined above.  Kidney function has decreased.  Need to stay hydrated.  Will need to recheck met b in one week to confirm stable/improved.  Liver function tests are wnl.  ?

## 2022-01-13 ENCOUNTER — Other Ambulatory Visit: Payer: Self-pay | Admitting: Internal Medicine

## 2022-01-26 ENCOUNTER — Ambulatory Visit (INDEPENDENT_AMBULATORY_CARE_PROVIDER_SITE_OTHER): Payer: PPO | Admitting: Internal Medicine

## 2022-01-26 ENCOUNTER — Other Ambulatory Visit: Payer: Self-pay

## 2022-01-26 ENCOUNTER — Encounter: Payer: Self-pay | Admitting: Internal Medicine

## 2022-01-26 VITALS — BP 130/70 | HR 61 | Temp 97.6°F | Resp 14 | Ht 62.0 in | Wt 135.2 lb

## 2022-01-26 DIAGNOSIS — D1803 Hemangioma of intra-abdominal structures: Secondary | ICD-10-CM

## 2022-01-26 DIAGNOSIS — H6121 Impacted cerumen, right ear: Secondary | ICD-10-CM

## 2022-01-26 DIAGNOSIS — E7800 Pure hypercholesterolemia, unspecified: Secondary | ICD-10-CM

## 2022-01-26 DIAGNOSIS — E039 Hypothyroidism, unspecified: Secondary | ICD-10-CM

## 2022-01-26 DIAGNOSIS — E1165 Type 2 diabetes mellitus with hyperglycemia: Secondary | ICD-10-CM | POA: Diagnosis not present

## 2022-01-26 DIAGNOSIS — I779 Disorder of arteries and arterioles, unspecified: Secondary | ICD-10-CM | POA: Diagnosis not present

## 2022-01-26 DIAGNOSIS — K219 Gastro-esophageal reflux disease without esophagitis: Secondary | ICD-10-CM | POA: Diagnosis not present

## 2022-01-26 DIAGNOSIS — I10 Essential (primary) hypertension: Secondary | ICD-10-CM | POA: Diagnosis not present

## 2022-01-26 DIAGNOSIS — R9389 Abnormal findings on diagnostic imaging of other specified body structures: Secondary | ICD-10-CM | POA: Diagnosis not present

## 2022-01-26 DIAGNOSIS — I739 Peripheral vascular disease, unspecified: Secondary | ICD-10-CM | POA: Diagnosis not present

## 2022-01-26 LAB — BASIC METABOLIC PANEL
BUN: 26 mg/dL — ABNORMAL HIGH (ref 6–23)
CO2: 28 mEq/L (ref 19–32)
Calcium: 10 mg/dL (ref 8.4–10.5)
Chloride: 104 mEq/L (ref 96–112)
Creatinine, Ser: 1.14 mg/dL (ref 0.40–1.20)
GFR: 45.99 mL/min — ABNORMAL LOW (ref >60.00)
Glucose, Bld: 121 mg/dL — ABNORMAL HIGH (ref 70–99)
Potassium: 4.3 mEq/L (ref 3.5–5.1)
Sodium: 140 mEq/L (ref 135–145)

## 2022-01-26 MED ORDER — PANTOPRAZOLE SODIUM 20 MG PO TBEC: 20.0000 mg | DELAYED_RELEASE_TABLET | Freq: Every day | ORAL | 1 refills | Status: DC

## 2022-01-26 NOTE — Progress Notes (Signed)
Patient ID: Mariah Moody, female   DOB: 1941-02-03, 81 y.o.   MRN: [id number] ? ? ?Subjective:  ? ? Patient ID: Mariah Moody, female    DOB: 05/28/1941, 81 y.o.   MRN: [id number] ? ?This visit occurred during the SARS-CoV-2 public health emergency.  Safety protocols were in place, including screening questions prior to the visit, additional usage of staff PPE, and extensive cleaning of exam room while observing appropriate contact time as indicated for disinfecting solutions.  ? ?Patient here for a scheduled follow up.  ? ?Chief Complaint  ?Patient presents with  ? Follow-up  ?  2 month f/u htn & cholesterol. Pt would like to discuss about restarting meloxicam, and insurance won't be covering the Combipatch anymore unless paperwork is filled out for an exemption.   ? .  ? ?HPI ?Here to follow up regarding blood pressure.  Last visit, aldactone increased to '25mg'$  q day.   ?Was also having some lower pelvic pain.  Pelvic ultrasound revealed a thickened endometrium.  Was referred to gyn. Has appt next week.  Discussed combipatch.  She is trying to stretch the patch.  Discussed desire to continue to taper off.  Insurance not covering.  Has been drinking more water.  Since doing this, she has not noticed any lower pelvic or abdominal discomfort.  Bowels moving.  Right hand stiffness.  Tylenol does help.  Discussed avoiding antiinflammatories.  Right ear full.  No pain.  No chest pain or sob reported.  Acid reflux controlled.  Discussed decrease protonix.   ? ? ?Past Medical History:  ?Diagnosis Date  ? Anemia   ? Coronary atherosclerosis of native coronary vessel   ? Diabetes mellitus without complication ([place])   ? Fibroid   ? GERD (gastroesophageal reflux disease)   ? History of chicken pox   ? Hyperlipidemia   ? Hypertension   ? Hypothyroidism   ? ?Past Surgical History:  ?Procedure Laterality Date  ? COLONOSCOPY N/A 04/15/2015  ? Procedure: COLONOSCOPY;  Surgeon: Hulen Luster, MD;  Location: [facility] ENDOSCOPY;   Service: Gastroenterology;  Laterality: N/A;  ? ESOPHAGOGASTRODUODENOSCOPY N/A 04/15/2015  ? Procedure: ESOPHAGOGASTRODUODENOSCOPY (EGD);  Surgeon: Hulen Luster, MD;  Location: [facility] ENDOSCOPY;  Service: Gastroenterology;  Laterality: N/A;  ? TUBAL LIGATION    ? ?Family History  ?Problem Relation Age of Onset  ? Arthritis Mother   ? Hypertension Mother   ? Colon polyps Mother   ? Heart disease Father   ? Hypertension Father   ? Diabetes Brother   ? Cancer Neg Hx   ? Breast cancer Neg Hx   ? ?Social History  ? ?Socioeconomic History  ? Marital status: Married  ?  Spouse name: Not on file  ? Number of children: Not on file  ? Years of education: Not on file  ? Highest education level: Not on file  ?Occupational History  ? Not on file  ?Tobacco Use  ? Smoking status: Former  ? Smokeless tobacco: Never  ?Substance and Sexual Activity  ? Alcohol use: Yes  ?  Alcohol/week: 0.0 standard drinks  ?  Comment: occas  ? Drug use: No  ? Sexual activity: Not Currently  ?Other Topics Concern  ? Not on file  ?Social History Narrative  ? Not on file  ? ?Social Determinants of Health  ? ?Financial Resource Strain: Not on file  ?Food Insecurity: Not on file  ?Transportation Needs: Not on file  ?Physical Activity: Not on file  ?Stress: Not on  file  ?Social Connections: Not on file  ? ? ? ?Review of Systems  ?Constitutional:  Negative for appetite change and unexpected weight change.  ?HENT:  Negative for congestion and sinus pressure.   ?Respiratory:  Negative for cough, chest tightness and shortness of breath.   ?Cardiovascular:  Negative for chest pain, palpitations and leg swelling.  ?Gastrointestinal:  Negative for abdominal pain, diarrhea, nausea and vomiting.  ?Genitourinary:  Negative for difficulty urinating and dysuria.  ?Musculoskeletal:  Negative for joint swelling and myalgias.  ?     Hand stiffness as outlined.   ?Skin:  Negative for color change and rash.  ?Neurological:  Negative for dizziness, light-headedness and headaches.   ?Psychiatric/Behavioral:  Negative for agitation and dysphoric mood.   ? ?   ?Objective:  ?  ? ?BP 130/70 (BP Location: Left Arm, Patient Position: Sitting, Cuff Size: Normal)   Pulse 61   Temp 97.6 ?F (36.4 ?C) (Oral)   Resp 14   Ht '5\' 2"'$  (1.575 m)   Wt 135 lb 3.2 oz (61.3 kg)   SpO2 98%   BMI 24.73 kg/m?  ?Wt Readings from Last 3 Encounters:  ?01/26/22 135 lb 3.2 oz (61.3 kg)  ?11/24/21 133 lb 9.6 oz (60.6 kg)  ?09/12/21 134 lb 9.6 oz (61.1 kg)  ? ? ?Physical Exam ?Vitals reviewed.  ?Constitutional:   ?   General: She is not in acute distress. ?   Appearance: Normal appearance.  ?HENT:  ?   Head: Normocephalic and atraumatic.  ?   Right Ear: External ear normal.  ?   Left Ear: External ear normal.  ?Eyes:  ?   General: No scleral icterus.    ?   Right eye: No discharge.     ?   Left eye: No discharge.  ?   Conjunctiva/sclera: Conjunctivae normal.  ?Neck:  ?   Thyroid: No thyromegaly.  ?Cardiovascular:  ?   Rate and Rhythm: Normal rate and regular rhythm.  ?Pulmonary:  ?   Effort: No respiratory distress.  ?   Breath sounds: Normal breath sounds. No wheezing.  ?Abdominal:  ?   General: Bowel sounds are normal.  ?   Palpations: Abdomen is soft.  ?   Tenderness: There is no abdominal tenderness.  ?Musculoskeletal:     ?   General: No swelling or tenderness.  ?   Cervical back: Neck supple. No tenderness.  ?Lymphadenopathy:  ?   Cervical: No cervical adenopathy.  ?Skin: ?   Findings: No erythema or rash.  ?Neurological:  ?   Mental Status: She is alert.  ?Psychiatric:     ?   Mood and Affect: Mood normal.     ?   Behavior: Behavior normal.  ? ? ? ?Outpatient Encounter Medications as of 01/26/2022  ?Medication Sig  ? Azelastine HCl 137 MCG/SPRAY SOLN PLACE 1 SPRAY INTO BOTH NOSTRILS 2 (TWO) TIMES DAILY. USE IN EACH NOSTRIL AS DIRECTED  ? Calcium Carbonate-Vitamin D (CALCIUM + D PO) Take 1 tablet by mouth daily.  ? estradiol-norethindrone (COMBIPATCH) 0.05-0.25 MG/DAY Place 1 patch onto the skin 2 (two) times a  week.  ? hydrochlorothiazide (HYDRODIURIL) 25 MG tablet TAKE 1 TABLET BY MOUTH EVERY DAY  ? levothyroxine (SYNTHROID) 88 MCG tablet TAKE 1 TABLET (88 MCG TOTAL) DAILY BY MOUTH.  ? Multiple Vitamins-Minerals (MULTIVITAMIN PO) Take 1 tablet by mouth daily.   ? olmesartan (BENICAR) 40 MG tablet TAKE 1 TABLET BY MOUTH EVERY DAY  ? pantoprazole (PROTONIX) 20 MG tablet  Take 1 tablet (20 mg total) by mouth daily.  ? spironolactone (ALDACTONE) 25 MG tablet Take 1 tablet (25 mg total) by mouth daily.  ? [DISCONTINUED] pantoprazole (PROTONIX) 40 MG tablet TAKE 1 TABLET BY MOUTH EVERY DAY  ? ?No facility-administered encounter medications on file as of 01/26/2022.  ?  ? ?Lab Results  ?Component Value Date  ? WBC 7.4 12/02/2020  ? HGB 12.5 12/02/2020  ? HCT 37.6 12/02/2020  ? PLT 323.0 12/02/2020  ? GLUCOSE 121 (H) 01/26/2022  ? CHOL 256 (H) 12/29/2021  ? TRIG 343.0 (H) 12/29/2021  ? HDL 32.80 (L) 12/29/2021  ? LDLDIRECT 133.0 12/29/2021  ? LDLCALC 139 (H) 12/02/2020  ? ALT 9 12/29/2021  ? AST 14 12/29/2021  ? NA 140 01/26/2022  ? K 4.3 01/26/2022  ? CL 104 01/26/2022  ? CREATININE 1.14 01/26/2022  ? BUN 26 (H) 01/26/2022  ? CO2 28 01/26/2022  ? TSH 2.60 12/02/2020  ? HGBA1C 6.9 (H) 12/29/2021  ? MICROALBUR 0.8 10/02/2019  ? ? ?US Pelvic Complete With Transvaginal ? ?Result Date: 12/08/2021 ?CLINICAL DATA:  Intermittent left lower quadrant pain for 6 weeks EXAM: TRANSABDOMINAL AND TRANSVAGINAL ULTRASOUND OF PELVIS TECHNIQUE: Both transabdominal and transvaginal ultrasound examinations of the pelvis were performed. Transabdominal technique was performed for global imaging of the pelvis including uterus, ovaries, adnexal regions, and pelvic cul-de-sac. It was necessary to proceed with endovaginal exam following the transabdominal exam to visualize the endometrium and adnexa. COMPARISON:  None FINDINGS: Uterus Measurements: 9.3 x 5.2 x 5.1 cm = volume: 130 mL. 3.3 x 3.3 x 2.9 cm fibroid noted in the left anterior and body. 1.1 x 0.8  x 1.1 cm fibroid noted in the anterior uterine fundus. Endometrium Thickness: 10 mm.  Diffusely thickened. Right ovary Not visualized. Left ovary Not visualized. Other findings No abnormal free fluid. IMPRESSION

## 2022-01-27 ENCOUNTER — Encounter: Payer: Self-pay | Admitting: Internal Medicine

## 2022-01-27 DIAGNOSIS — R9389 Abnormal findings on diagnostic imaging of other specified body structures: Secondary | ICD-10-CM | POA: Insufficient documentation

## 2022-01-27 DIAGNOSIS — H6120 Impacted cerumen, unspecified ear: Secondary | ICD-10-CM | POA: Insufficient documentation

## 2022-01-27 NOTE — Assessment & Plan Note (Signed)
Has declined cholesterol medication.  Continue low cholesterol diet and exercise.  Follow lipid panel.  

## 2022-01-27 NOTE — Assessment & Plan Note (Signed)
Evaluated by Dr Delana Meyer.  Recommended f/u carotid ultrasound in 24 months.  ?

## 2022-01-27 NOTE — Assessment & Plan Note (Signed)
On thyroid replacement.  Follow tsh.  

## 2022-01-27 NOTE — Assessment & Plan Note (Signed)
Right ear.  Debrox. Discussed ear irrigation.  Will call if desires to schedule.  ?

## 2022-01-27 NOTE — Assessment & Plan Note (Signed)
Blood pressure better. Continue olmesartan, hctz. Had problems with amlodipine.  Intolerance to hydralazine.  On spironolactone 25mg q day.  Blood pressure improved.  Follow pressures.  Follow metabolic panel. Check metabolic panel today.  

## 2022-01-27 NOTE — Assessment & Plan Note (Signed)
Controlled.  Decrease protonix to '20mg'$  q day.  Follow.  ?

## 2022-01-27 NOTE — Assessment & Plan Note (Signed)
She watches what she eats.  Low carb diet and exercise.  Stays active.  Check metabolic panel and a1c.  

## 2022-01-27 NOTE — Assessment & Plan Note (Signed)
Noted on recent ultrasound.  No further lower abdominal/pelvic pain.  On combipatch. Discussed again today.  States she is trying to stretch out the patches.  Continue to see if can taper. Discussed desire to get her off.   Has appt with gyn next week.  ?

## 2022-01-27 NOTE — Assessment & Plan Note (Signed)
Seeing Dr Delana Meyer - just evaluated.  Recommended f/u future ABIs.  Continue risk factor modification.  ?

## 2022-01-27 NOTE — Assessment & Plan Note (Signed)
MRI 06/15/21 - unchanged.  Recommended f/u in 12 months.   ?

## 2022-02-03 ENCOUNTER — Ambulatory Visit: Payer: PPO | Admitting: Obstetrics and Gynecology

## 2022-02-03 ENCOUNTER — Encounter: Payer: Self-pay | Admitting: Obstetrics and Gynecology

## 2022-02-03 VITALS — BP 156/77 | HR 68 | Ht 62.0 in | Wt 132.3 lb

## 2022-02-03 DIAGNOSIS — Z7689 Persons encountering health services in other specified circumstances: Secondary | ICD-10-CM | POA: Diagnosis not present

## 2022-02-03 DIAGNOSIS — R9389 Abnormal findings on diagnostic imaging of other specified body structures: Secondary | ICD-10-CM | POA: Diagnosis not present

## 2022-02-03 NOTE — Progress Notes (Signed)
HPI: ?     [name] is a 81 y.o. [phone] who LMP was No LMP recorded. Patient is postmenopausal. ? ?Subjective:  ? ?She presents today because she was having some pelvic pain intermittently and underwent an ultrasound.  Of significant note, her pelvic pain is mostly resolved at this time.  She attributes resolution to increase consumption of water/liquids. ?An incidental finding on ultrasound found the endometrium to be 10 mm in size and the presence of uterine fibroids.  Patient has not experienced any postmenopausal bleeding in more than 10 years. ?She is using a patch that contains both estrogen and norethindrone.  She is very happy on this patch and would like to continue it. ? ?  Hx: ?The following portions of the patient's history were reviewed and updated as appropriate: ?            She  has a past medical history of Anemia, Coronary atherosclerosis of native coronary vessel, Diabetes mellitus without complication ([place]), Fibroid, GERD (gastroesophageal reflux disease), History of chicken pox, Hyperlipidemia, Hypertension, and Hypothyroidism. ?She does not have any pertinent problems on file. ?She  has a past surgical history that includes Tubal ligation; Colonoscopy (N/A, 04/15/2015); and Esophagogastroduodenoscopy (N/A, 04/15/2015). ?Her family history includes Arthritis in her mother; Colon polyps in her mother; Diabetes in her brother; Heart disease in her father; Hypertension in her father and mother. ?She  reports that she has quit smoking. She has never used smokeless tobacco. She reports that she does not currently use alcohol. She reports that she does not use drugs. ?She has a current medication list which includes the following prescription(s): azelastine hcl, combipatch, hydrochlorothiazide, levothyroxine, multiple vitamin, olmesartan, pantoprazole, and spironolactone. ?She is allergic to statins, sulfa antibiotics, and iodine. ?      ?Review of Systems:  ?Review of  Systems ? ?Constitutional: Denied constitutional symptoms, night sweats, recent illness, fatigue, fever, insomnia and weight loss.  ?Eyes: Denied eye symptoms, eye pain, photophobia, vision change and visual disturbance.  ?Ears/Nose/Throat/Neck: Denied ear, nose, throat or neck symptoms, hearing loss, nasal discharge, sinus congestion and sore throat.  ?Cardiovascular: Denied cardiovascular symptoms, arrhythmia, chest pain/pressure, edema, exercise intolerance, orthopnea and palpitations.  ?Respiratory: Denied pulmonary symptoms, asthma, pleuritic pain, productive sputum, cough, dyspnea and wheezing.  ?Gastrointestinal: Denied, gastro-esophageal reflux, melena, nausea and vomiting.  ?Genitourinary: Denied genitourinary symptoms including symptomatic vaginal discharge, pelvic relaxation issues, and urinary complaints.  ?Musculoskeletal: Denied musculoskeletal symptoms, stiffness, swelling, muscle weakness and myalgia.  ?Dermatologic: Denied dermatology symptoms, rash and scar.  ?Neurologic: Denied neurology symptoms, dizziness, headache, neck pain and syncope.  ?Psychiatric: Denied psychiatric symptoms, anxiety and depression.  ?Endocrine: Denied endocrine symptoms including hot flashes and night sweats.  ? ?Meds: ?  ?Current Outpatient Medications on File Prior to Visit  ?Medication Sig Dispense Refill  ? Azelastine HCl 137 MCG/SPRAY SOLN PLACE 1 SPRAY INTO BOTH NOSTRILS 2 (TWO) TIMES DAILY. USE IN EACH NOSTRIL AS DIRECTED 30 mL 1  ? estradiol-norethindrone (COMBIPATCH) 0.05-0.25 MG/DAY Place 1 patch onto the skin 2 (two) times a week. 24 patch 1  ? hydrochlorothiazide (HYDRODIURIL) 25 MG tablet TAKE 1 TABLET BY MOUTH EVERY DAY 90 tablet 1  ? levothyroxine (SYNTHROID) 88 MCG tablet TAKE 1 TABLET (88 MCG TOTAL) DAILY BY MOUTH. 90 tablet 0  ? Multiple Vitamins-Minerals (MULTIVITAMIN PO) Take 1 tablet by mouth daily.     ? olmesartan (BENICAR) 40 MG tablet TAKE 1 TABLET BY MOUTH EVERY DAY 90 tablet 0  ? pantoprazole  (PROTONIX)  20 MG tablet Take 1 tablet (20 mg total) by mouth daily. 90 tablet 1  ? spironolactone (ALDACTONE) 25 MG tablet Take 1 tablet (25 mg total) by mouth daily. 30 tablet 2  ? ?No current facility-administered medications on file prior to visit.  ? ? ? ? ?Objective:  ?  ? ?Vitals:  ? 02/03/22 1017  ?BP: (!) 156/77  ?Pulse: 68  ? ?Filed Weights  ? 02/03/22 1017  ?Weight: 132 lb 4.8 oz (60 kg)  ? ?  ?         See ultrasound results. ?        ? ?Assessment:  ?  ?[id number] ?Patient Active Problem List  ? Diagnosis Date Noted  ? Cerumen impaction 01/27/2022  ? Thickened endometrium 01/27/2022  ? Drainage from nose 11/30/2021  ? Left lower quadrant pain 11/30/2021  ? Carotid artery disease ([place]) 08/24/2021  ? Carotid bruit 06/03/2021  ? PAD (peripheral artery disease) ([place]) 06/03/2021  ? History of COVID-19 12/08/2020  ? Hemangioma 08/11/2020  ? Diminished pulse 05/01/2020  ? Muscle ache 04/29/2020  ? Abdominal bruit 03/19/2020  ? Chest tightness 03/18/2020  ? Sinusitis 10/11/2019  ? Hot flashes 05/10/2015  ? Fatigue 05/10/2015  ? Health care maintenance 01/13/2015  ? Family history of colonic polyps 11/05/2014  ? Essential hypertension, benign 08/07/2014  ? Hypercholesterolemia 08/07/2014  ? Type 2 diabetes mellitus with hyperglycemia ([place]) 08/07/2014  ? Fibroid, uterine 08/07/2014  ? GERD (gastroesophageal reflux disease) 08/07/2014  ? Hypothyroidism 08/07/2014  ? ?  ?1. Establishing care with new doctor, encounter for   ?2. Thickened endometrium   ? ? Incidental finding of endometrium greater than 10 mm in a postmenopausal woman without bleeding. ? ? ?Plan:  ?  ?       ? 1.  Based on ultrasound OB/GYN journal article, " in postmenopausal women without vaginal bleeding, the risk of cancer is approximately 6.7% if the endometrium is greater than 11 mm and 0.002% of the endometrium is less than 11 mm. ?2.  She has been on continuous HRT for years also reducing her risk for endometrial cancer. ? ?We have reviewed the  ultrasound findings and all of the above data.  I do not believe she needs an endometrial biopsy at this time as her endometrial cancer risk is low.  She has expressed her desire to continue on HRT.  Although I offered an endometrial biopsy she has declined it at this time.  She will inform us if she has any episodes of vaginal bleeding.  The fibroids are an incidental finding as well. ?Orders ?No orders of the defined types were placed in this encounter. ? ? No orders of the defined types were placed in this encounter. ?  ?  F/U ? No follow-ups on file. ?I spent 32 minutes involved in the care of this patient preparing to see the patient by obtaining and reviewing her medical history (including labs, imaging tests and prior procedures), documenting clinical information in the electronic health record (EHR), counseling and coordinating care plans, writing and sending prescriptions, ordering tests or procedures and in direct communicating with the patient and medical staff discussing pertinent items from her history and physical exam. ? ?Finis Bud, M.D. ?02/03/2022 ?10:52 AM ? ? ? ? ?

## 2022-02-03 NOTE — Progress Notes (Signed)
Patient presents today for endometrial thickening and fibroids. She states she has had discomfort for months but no bleeding. Patient states curiosity in next steps. No other questions stated at this time. ?

## 2022-02-07 ENCOUNTER — Other Ambulatory Visit: Payer: Self-pay | Admitting: Family

## 2022-02-13 ENCOUNTER — Other Ambulatory Visit: Payer: Self-pay | Admitting: Internal Medicine

## 2022-02-19 ENCOUNTER — Other Ambulatory Visit: Payer: Self-pay | Admitting: Internal Medicine

## 2022-03-14 ENCOUNTER — Other Ambulatory Visit: Payer: Self-pay | Admitting: Family

## 2022-04-10 ENCOUNTER — Other Ambulatory Visit: Payer: Self-pay | Admitting: Internal Medicine

## 2022-04-12 ENCOUNTER — Other Ambulatory Visit: Payer: Self-pay | Admitting: Family

## 2022-04-13 ENCOUNTER — Other Ambulatory Visit: Payer: Self-pay | Admitting: Family

## 2022-04-14 NOTE — Telephone Encounter (Signed)
Pt need refill on levothyroxine and Olmesartan sent to [facility] in [place]

## 2022-04-15 NOTE — Telephone Encounter (Signed)
Pt is out of medication and wants an update on refill

## 2022-04-16 NOTE — Telephone Encounter (Signed)
Pt called in requesting refill on medication (olmesartan (BENICAR) 40 MG tablet) and (levothyroxine (SYNTHROID) 88 MCG tablet)... Pt requesting callback.Marland KitchenMarland Kitchen

## 2022-04-17 ENCOUNTER — Other Ambulatory Visit: Payer: Self-pay

## 2022-04-17 MED ORDER — OLMESARTAN MEDOXOMIL 40 MG PO TABS: 40.0000 mg | ORAL_TABLET | Freq: Every day | ORAL | 0 refills | Status: DC

## 2022-04-17 MED ORDER — LEVOTHYROXINE SODIUM 88 MCG PO TABS: ORAL_TABLET | ORAL | 0 refills | Status: DC

## 2022-04-17 NOTE — Telephone Encounter (Signed)
Patient called about her medication. She states it has been a week since she has been trying to get medication refilled. She also states that she is out of her medication.

## 2022-04-17 NOTE — Telephone Encounter (Signed)
Lm on vm that mediation refills sent to [facility], per Judson Roch

## 2022-04-24 ENCOUNTER — Other Ambulatory Visit: Payer: Self-pay

## 2022-04-24 ENCOUNTER — Telehealth: Payer: Self-pay | Admitting: Internal Medicine

## 2022-04-24 MED ORDER — COMBIPATCH 0.05-0.25 MG/DAY TD PTTW: 1.0000 | MEDICATED_PATCH | TRANSDERMAL | 1 refills | Status: DC

## 2022-04-28 ENCOUNTER — Encounter: Payer: Self-pay | Admitting: Internal Medicine

## 2022-04-28 ENCOUNTER — Ambulatory Visit (INDEPENDENT_AMBULATORY_CARE_PROVIDER_SITE_OTHER): Payer: PPO | Admitting: Internal Medicine

## 2022-04-28 VITALS — BP 120/72 | HR 80 | Temp 98.7°F | Resp 16 | Ht 62.0 in | Wt 135.6 lb

## 2022-04-28 DIAGNOSIS — Z1231 Encounter for screening mammogram for malignant neoplasm of breast: Secondary | ICD-10-CM | POA: Diagnosis not present

## 2022-04-28 DIAGNOSIS — M79642 Pain in left hand: Secondary | ICD-10-CM

## 2022-04-28 DIAGNOSIS — M79641 Pain in right hand: Secondary | ICD-10-CM | POA: Diagnosis not present

## 2022-04-28 DIAGNOSIS — Z0000 Encounter for general adult medical examination without abnormal findings: Secondary | ICD-10-CM | POA: Diagnosis not present

## 2022-04-28 DIAGNOSIS — E2839 Other primary ovarian failure: Secondary | ICD-10-CM | POA: Diagnosis not present

## 2022-04-28 DIAGNOSIS — E1165 Type 2 diabetes mellitus with hyperglycemia: Secondary | ICD-10-CM | POA: Diagnosis not present

## 2022-04-28 DIAGNOSIS — I10 Essential (primary) hypertension: Secondary | ICD-10-CM | POA: Diagnosis not present

## 2022-04-28 DIAGNOSIS — I779 Disorder of arteries and arterioles, unspecified: Secondary | ICD-10-CM

## 2022-04-28 DIAGNOSIS — I739 Peripheral vascular disease, unspecified: Secondary | ICD-10-CM | POA: Diagnosis not present

## 2022-04-28 DIAGNOSIS — K219 Gastro-esophageal reflux disease without esophagitis: Secondary | ICD-10-CM

## 2022-04-28 DIAGNOSIS — E039 Hypothyroidism, unspecified: Secondary | ICD-10-CM

## 2022-04-28 DIAGNOSIS — E7800 Pure hypercholesterolemia, unspecified: Secondary | ICD-10-CM | POA: Diagnosis not present

## 2022-04-28 DIAGNOSIS — R9389 Abnormal findings on diagnostic imaging of other specified body structures: Secondary | ICD-10-CM

## 2022-04-28 LAB — CBC WITH DIFFERENTIAL/PLATELET
Basophils Absolute: 0 10*3/uL (ref 0.0–0.1)
Basophils Relative: 0.8 % (ref 0.0–3.0)
Eosinophils Absolute: 0.1 10*3/uL (ref 0.0–0.7)
Eosinophils Relative: 2.5 % (ref 0.0–5.0)
HCT: 36.8 % (ref 36.0–46.0)
Hemoglobin: 12.2 g/dL (ref 12.0–15.0)
Lymphocytes Relative: 27.3 % (ref 12.0–46.0)
Lymphs Abs: 1.6 10*3/uL (ref 0.7–4.0)
MCHC: 33.1 g/dL (ref 30.0–36.0)
MCV: 95.3 fl (ref 78.0–100.0)
Monocytes Absolute: 0.5 10*3/uL (ref 0.1–1.0)
Monocytes Relative: 8.7 % (ref 3.0–12.0)
Neutro Abs: 3.6 10*3/uL (ref 1.4–7.7)
Neutrophils Relative %: 60.7 % (ref 43.0–77.0)
Platelets: 255 10*3/uL (ref 150.0–400.0)
RBC: 3.86 Mil/uL — ABNORMAL LOW (ref 3.87–5.11)
RDW: 12.3 % (ref 11.5–15.5)
WBC: 5.8 10*3/uL (ref 4.0–10.5)

## 2022-04-28 LAB — BASIC METABOLIC PANEL
BUN: 41 mg/dL — ABNORMAL HIGH (ref 6–23)
CO2: 27 mEq/L (ref 19–32)
Calcium: 10.1 mg/dL (ref 8.4–10.5)
Chloride: 103 mEq/L (ref 96–112)
Creatinine, Ser: 1.29 mg/dL — ABNORMAL HIGH (ref 0.40–1.20)
GFR: 39.58 mL/min — ABNORMAL LOW (ref >60.00)
Glucose, Bld: 142 mg/dL — ABNORMAL HIGH (ref 70–99)
Potassium: 4.3 mEq/L (ref 3.5–5.1)
Sodium: 138 mEq/L (ref 135–145)

## 2022-04-28 LAB — HEPATIC FUNCTION PANEL
ALT: 11 U/L (ref 0–35)
AST: 13 U/L (ref 0–37)
Albumin: 4.3 g/dL (ref 3.5–5.2)
Alkaline Phosphatase: 79 U/L (ref 39–117)
Bilirubin, Direct: 0.1 mg/dL (ref 0.0–0.3)
Total Bilirubin: 0.6 mg/dL (ref 0.2–1.2)
Total Protein: 6.7 g/dL (ref 6.0–8.3)

## 2022-04-28 LAB — MICROALBUMIN / CREATININE URINE RATIO
Creatinine,U: 92.6 mg/dL
Microalb Creat Ratio: 0.8 mg/g (ref 0.0–30.0)
Microalb, Ur: <0.7 mg/dL (ref 0.0–1.9)

## 2022-04-28 LAB — TSH: TSH: 3.56 u[IU]/mL (ref 0.35–5.50)

## 2022-04-28 LAB — LDL CHOLESTEROL, DIRECT: Direct LDL: 106 mg/dL

## 2022-04-28 LAB — HEMOGLOBIN A1C: Hgb A1c MFr Bld: 6.7 % — ABNORMAL HIGH (ref 4.6–6.5)

## 2022-04-28 LAB — LIPID PANEL
Cholesterol: 229 mg/dL — ABNORMAL HIGH (ref 0–200)
HDL: 33.5 mg/dL — ABNORMAL LOW (ref >39.00)
Total CHOL/HDL Ratio: 7
Triglycerides: 410 mg/dL — ABNORMAL HIGH (ref 0.0–149.0)

## 2022-04-28 LAB — HM DIABETES FOOT EXAM

## 2022-04-28 MED ORDER — PANTOPRAZOLE SODIUM 40 MG PO TBEC: 40.0000 mg | DELAYED_RELEASE_TABLET | Freq: Every day | ORAL | 1 refills | Status: DC

## 2022-04-28 NOTE — Assessment & Plan Note (Signed)
Physical today 04/28/22.  Mammogram 06/10/21 - Birads I.  Colonoscopy 2016 - per report - no f/u warranted.

## 2022-04-28 NOTE — Progress Notes (Signed)
Patient ID: Dawnya Grams, female   DOB: 03/25/1941, 81 y.o.   MRN: [id number]   Subjective:    Patient ID: [name], female    DOB: October 30, 1941, 81 y.o.   MRN: [id number]   Patient here for her physical exam.    HPI Reports she is doing relatively well.  She has been having issues with her hands.  She uses her hands a lot with her job.  Increased problems with trigger finger.  Feels needs to see a specialist.  Discussed referral.  No chest pain or sob reported.  No increased cough or congestion.  No abdominal pain.  Bowels moving.  Discussed bone density.    Past Medical History:  Diagnosis Date   Anemia    Coronary atherosclerosis of native coronary vessel    Diabetes mellitus without complication (HCC)    Fibroid    GERD (gastroesophageal reflux disease)    History of chicken pox    Hyperlipidemia    Hypertension    Hypothyroidism    Past Surgical History:  Procedure Laterality Date   COLONOSCOPY N/A 04/15/2015   Procedure: COLONOSCOPY;  Surgeon: Hulen Luster, MD;  Location: [facility] ENDOSCOPY;  Service: Gastroenterology;  Laterality: N/A;   ESOPHAGOGASTRODUODENOSCOPY N/A 04/15/2015   Procedure: ESOPHAGOGASTRODUODENOSCOPY (EGD);  Surgeon: Hulen Luster, MD;  Location: [facility] ENDOSCOPY;  Service: Gastroenterology;  Laterality: N/A;   TUBAL LIGATION     Family History  Problem Relation Age of Onset   Arthritis Mother    Hypertension Mother    Colon polyps Mother    Heart disease Father    Hypertension Father    Diabetes Brother    Cancer Neg Hx    Breast cancer Neg Hx    Social History   Socioeconomic History   Marital status: Married    Spouse name: Not on file   Number of children: Not on file   Years of education: Not on file   Highest education level: Not on file  Occupational History   Not on file  Tobacco Use   Smoking status: Former   Smokeless tobacco: Never  Substance and Sexual Activity   Alcohol use: Not Currently    Comment: occas   Drug use: No    Sexual activity: Not Currently  Other Topics Concern   Not on file  Social History Narrative   Not on file   Social Determinants of Health   Financial Resource Strain: Not on file  Food Insecurity: Not on file  Transportation Needs: Not on file  Physical Activity: Not on file  Stress: Not on file  Social Connections: Not on file     Review of Systems  Constitutional:  Negative for appetite change and unexpected weight change.  HENT:  Negative for congestion, sinus pressure and sore throat.   Eyes:  Negative for pain and visual disturbance.  Respiratory:  Negative for cough, chest tightness and shortness of breath.   Cardiovascular:  Negative for chest pain, palpitations and leg swelling.  Gastrointestinal:  Negative for abdominal pain, diarrhea, nausea and vomiting.  Genitourinary:  Negative for difficulty urinating and dysuria.  Musculoskeletal:        Hand stiffness and aching.  Trigger finger.   Skin:  Negative for color change and rash.  Neurological:  Negative for dizziness, light-headedness and headaches.  Hematological:  Negative for adenopathy. Does not bruise/bleed easily.  Psychiatric/Behavioral:  Negative for agitation and dysphoric mood.        Objective:  BP 120/72 (BP Location: Left Arm, Patient Position: Sitting, Cuff Size: Small)   Pulse 80   Temp 98.7 F (37.1 C) (Temporal)   Resp 16   Ht '5\' 2"'$  (1.575 m)   Wt 135 lb 9.6 oz (61.5 kg)   SpO2 96%   BMI 24.80 kg/m  Wt Readings from Last 3 Encounters:  04/28/22 135 lb 9.6 oz (61.5 kg)  02/03/22 132 lb 4.8 oz (60 kg)  01/26/22 135 lb 3.2 oz (61.3 kg)    Physical Exam Vitals reviewed.  Constitutional:      General: She is not in acute distress.    Appearance: Normal appearance. She is well-developed.  HENT:     Head: Normocephalic and atraumatic.     Right Ear: External ear normal.     Left Ear: External ear normal.  Eyes:     General: No scleral icterus.       Right eye: No discharge.         Left eye: No discharge.     Conjunctiva/sclera: Conjunctivae normal.  Neck:     Thyroid: No thyromegaly.  Cardiovascular:     Rate and Rhythm: Normal rate and regular rhythm.  Pulmonary:     Effort: No tachypnea, accessory muscle usage or respiratory distress.     Breath sounds: Normal breath sounds. No decreased breath sounds or wheezing.  Chest:  Breasts:    Right: No inverted nipple, mass, nipple discharge or tenderness (no axillary adenopathy).     Left: No inverted nipple, mass, nipple discharge or tenderness (no axilarry adenopathy).  Abdominal:     General: Bowel sounds are normal.     Palpations: Abdomen is soft.     Tenderness: There is no abdominal tenderness.  Musculoskeletal:        General: No swelling or tenderness.     Cervical back: Neck supple.  Lymphadenopathy:     Cervical: No cervical adenopathy.  Skin:    Findings: No erythema or rash.  Neurological:     Mental Status: She is alert and oriented to person, place, and time.  Psychiatric:        Mood and Affect: Mood normal.        Behavior: Behavior normal.      Outpatient Encounter Medications as of 04/28/2022  Medication Sig   Azelastine HCl 137 MCG/SPRAY SOLN PLACE 1 SPRAY INTO BOTH NOSTRILS 2 (TWO) TIMES DAILY. USE IN EACH NOSTRIL AS DIRECTED   estradiol-norethindrone (COMBIPATCH) 0.05-0.25 MG/DAY Place 1 patch onto the skin 2 (two) times a week.   hydrochlorothiazide (HYDRODIURIL) 25 MG tablet TAKE 1 TABLET BY MOUTH EVERY DAY   levothyroxine (SYNTHROID) 88 MCG tablet TAKE 1 TABLET (88 MCG TOTAL) DAILY BY MOUTH.   levothyroxine (SYNTHROID) 88 MCG tablet TAKE 1 TABLET (88 MCG TOTAL) DAILY BY MOUTH.   Multiple Vitamins-Minerals (MULTIVITAMIN PO) Take 1 tablet by mouth daily.    olmesartan (BENICAR) 40 MG tablet Take 1 tablet (40 mg total) by mouth daily.   pantoprazole (PROTONIX) 40 MG tablet Take 1 tablet (40 mg total) by mouth daily.   spironolactone (ALDACTONE) 25 MG tablet TAKE 1 TABLET (25 MG  TOTAL) BY MOUTH DAILY.   [DISCONTINUED] pantoprazole (PROTONIX) 20 MG tablet Take 1 tablet (20 mg total) by mouth daily.   No facility-administered encounter medications on file as of 04/28/2022.     Lab Results  Component Value Date   WBC 5.8 04/28/2022   HGB 12.2 04/28/2022   HCT 36.8 04/28/2022   PLT  255.0 04/28/2022   GLUCOSE 142 (H) 04/28/2022   CHOL 229 (H) 04/28/2022   TRIG (H) 04/28/2022    410.0 Triglyceride is over 400; calculations on Lipids are invalid.   HDL 33.50 (L) 04/28/2022   LDLDIRECT 106.0 04/28/2022   LDLCALC 139 (H) 12/02/2020   ALT 11 04/28/2022   AST 13 04/28/2022   NA 138 04/28/2022   K 4.3 04/28/2022   CL 103 04/28/2022   CREATININE 1.29 (H) 04/28/2022   BUN 41 (H) 04/28/2022   CO2 27 04/28/2022   TSH 3.56 04/28/2022   HGBA1C 6.7 (H) 04/28/2022   MICROALBUR <0.7 04/28/2022    US Pelvic Complete With Transvaginal  Result Date: 12/08/2021 CLINICAL DATA:  Intermittent left lower quadrant pain for 6 weeks EXAM: TRANSABDOMINAL AND TRANSVAGINAL ULTRASOUND OF PELVIS TECHNIQUE: Both transabdominal and transvaginal ultrasound examinations of the pelvis were performed. Transabdominal technique was performed for global imaging of the pelvis including uterus, ovaries, adnexal regions, and pelvic cul-de-sac. It was necessary to proceed with endovaginal exam following the transabdominal exam to visualize the endometrium and adnexa. COMPARISON:  None FINDINGS: Uterus Measurements: 9.3 x 5.2 x 5.1 cm = volume: 130 mL. 3.3 x 3.3 x 2.9 cm fibroid noted in the left anterior and body. 1.1 x 0.8 x 1.1 cm fibroid noted in the anterior uterine fundus. Endometrium Thickness: 10 mm.  Diffusely thickened. Right ovary Not visualized. Left ovary Not visualized. Other findings No abnormal free fluid. IMPRESSION: 1. Fibroid uterus. 2. Nonvisualization of the ovaries. 3. Abnormally thickened endometrium measuring 10 mm. Endometrial thickness is considered abnormal for an asymptomatic  post-menopausal female. Endometrial sampling should be considered to exclude carcinoma. Electronically Signed   By: Miachel Roux M.D.   On: 12/08/2021 13:27       Assessment & Plan:   Problem List Items Addressed This Visit     Carotid artery disease ([place])    Evaluated by Dr Delana Meyer.  Recommended f/u carotid ultrasound in 24 months. Last seen 08/2021.       Essential hypertension, benign    Blood pressure better. Continue olmesartan, hctz. Had problems with amlodipine.  Intolerance to hydralazine.  On spironolactone '25mg'$  q day.  Blood pressure improved.  Follow pressures.  Follow metabolic panel. Check metabolic panel today.       Relevant Orders   Basic Metabolic Panel (BMET) (Completed)   GERD (gastroesophageal reflux disease)    Controlled.  Taking protonix '40mg'$  q day.  Follow.       Relevant Medications   pantoprazole (PROTONIX) 40 MG tablet   Hand pain    Hand pain and stiffness.  Increased pain - trigger finger.  Discussed further w/up and evaluation.  Request referral.  Referral to Dr Peggye Ley.       Relevant Orders   Ambulatory referral to Orthopedic Surgery   Health care maintenance    Physical today 04/28/22.  Mammogram 06/10/21 - Birads I.  Colonoscopy 2016 - per report - no f/u warranted.       Hypercholesterolemia    Has declined cholesterol medication.  Continue low cholesterol diet and exercise.  Follow lipid panel.       Relevant Orders   Lipid Profile (Completed)   Hepatic function panel (Completed)   CBC with Differential/Platelet (Completed)   Hypothyroidism    On thyroid replacement.  Follow tsh.       Relevant Orders   TSH (Completed)   PAD (peripheral artery disease) (HCC)    Seeing Dr Delana Meyer - just evaluated.  Recommended f/u future ABIs.  Continue risk factor modification.  Last seen 08/2021.        Thickened endometrium    Dr Amalia Hailey (02/03/22) - discussed endometrial biopsy.  See note.  States decided to hold. Continue HRT.       Type 2  diabetes mellitus with hyperglycemia ([place])    She watches what she eats.  Low carb diet and exercise.  Stays active.  Check metabolic panel and U2V.       Relevant Orders   HgB A1c (Completed)   Urine Microalbumin w/creat. ratio (Completed)   Other Visit Diagnoses     Routine general medical examination at a health care facility    -  Primary   Encounter for screening mammogram for malignant neoplasm of breast       Relevant Orders   MM 3D SCREEN BREAST BILATERAL   Estrogen deficiency       Relevant Orders   DG Bone Density        Einar Pheasant, MD

## 2022-04-30 ENCOUNTER — Telehealth: Payer: Self-pay

## 2022-04-30 NOTE — Telephone Encounter (Signed)
LMTCB for lab results.  

## 2022-05-01 ENCOUNTER — Other Ambulatory Visit: Payer: Self-pay

## 2022-05-01 ENCOUNTER — Other Ambulatory Visit: Payer: Self-pay | Admitting: *Deleted

## 2022-05-01 DIAGNOSIS — I10 Essential (primary) hypertension: Secondary | ICD-10-CM

## 2022-05-02 ENCOUNTER — Other Ambulatory Visit: Payer: Self-pay | Admitting: Internal Medicine

## 2022-05-02 DIAGNOSIS — R944 Abnormal results of kidney function studies: Secondary | ICD-10-CM

## 2022-05-02 DIAGNOSIS — M79643 Pain in unspecified hand: Secondary | ICD-10-CM | POA: Insufficient documentation

## 2022-05-02 NOTE — Assessment & Plan Note (Signed)
Has declined cholesterol medication.  Continue low cholesterol diet and exercise.  Follow lipid panel.  

## 2022-05-02 NOTE — Assessment & Plan Note (Signed)
Blood pressure better. Continue olmesartan, hctz. Had problems with amlodipine.  Intolerance to hydralazine.  On spironolactone '25mg'$  q day.  Blood pressure improved.  Follow pressures.  Follow metabolic panel. Check metabolic panel today.

## 2022-05-02 NOTE — Assessment & Plan Note (Signed)
She watches what she eats.  Low carb diet and exercise.  Stays active.  Check metabolic panel and a1c.  

## 2022-05-02 NOTE — Assessment & Plan Note (Signed)
Evaluated by Dr Delana Meyer.  Recommended f/u carotid ultrasound in 24 months. Last seen 08/2021.

## 2022-05-02 NOTE — Assessment & Plan Note (Signed)
Dr Evans (02/03/22) - discussed endometrial biopsy.  See note.  States decided to hold. Continue HRT.  

## 2022-05-02 NOTE — Assessment & Plan Note (Signed)
Hand pain and stiffness.  Increased pain - trigger finger.  Discussed further w/up and evaluation.  Request referral.  Referral to Dr Peggye Ley.

## 2022-05-02 NOTE — Assessment & Plan Note (Signed)
Controlled.  Taking protonix '40mg'$  q day.  Follow.

## 2022-05-02 NOTE — Assessment & Plan Note (Signed)
Seeing Dr Delana Meyer - just evaluated.  Recommended f/u future ABIs.  Continue risk factor modification.  Last seen 08/2021.

## 2022-05-02 NOTE — Assessment & Plan Note (Signed)
On thyroid replacement.  Follow tsh.  

## 2022-05-02 NOTE — Progress Notes (Signed)
Order placed for renal ultrasound.

## 2022-05-04 ENCOUNTER — Telehealth: Payer: Self-pay | Admitting: Internal Medicine

## 2022-05-04 ENCOUNTER — Other Ambulatory Visit (INDEPENDENT_AMBULATORY_CARE_PROVIDER_SITE_OTHER): Payer: PPO

## 2022-05-04 DIAGNOSIS — I10 Essential (primary) hypertension: Secondary | ICD-10-CM | POA: Diagnosis not present

## 2022-05-04 LAB — BASIC METABOLIC PANEL
BUN: 35 mg/dL — ABNORMAL HIGH (ref 6–23)
CO2: 23 mEq/L (ref 19–32)
Calcium: 9.2 mg/dL (ref 8.4–10.5)
Chloride: 105 mEq/L (ref 96–112)
Creatinine, Ser: 1.27 mg/dL — ABNORMAL HIGH (ref 0.40–1.20)
GFR: 40.33 mL/min — ABNORMAL LOW (ref >60.00)
Glucose, Bld: 169 mg/dL — ABNORMAL HIGH (ref 70–99)
Potassium: 4 mEq/L (ref 3.5–5.1)
Sodium: 139 mEq/L (ref 135–145)

## 2022-05-04 NOTE — Telephone Encounter (Signed)
Lft pt vm to call ofc . thanks 

## 2022-05-07 NOTE — Telephone Encounter (Signed)
Patient returned referrals phone call. 

## 2022-05-11 ENCOUNTER — Telehealth: Payer: Self-pay | Admitting: Internal Medicine

## 2022-05-11 NOTE — Telephone Encounter (Signed)
Lft pt vm to call ofc to sch US renal. thanks

## 2022-05-11 NOTE — Telephone Encounter (Signed)
Patient returned referrals phone call. 

## 2022-06-09 DIAGNOSIS — M19042 Primary osteoarthritis, left hand: Secondary | ICD-10-CM | POA: Diagnosis not present

## 2022-06-09 DIAGNOSIS — M19041 Primary osteoarthritis, right hand: Secondary | ICD-10-CM | POA: Diagnosis not present

## 2022-06-15 ENCOUNTER — Ambulatory Visit
Admission: RE | Admit: 2022-06-15 | Discharge: 2022-06-15 | Disposition: A | Discharge status: Home / Self Care | Payer: PPO | Source: Ambulatory Visit | Attending: Internal Medicine | Admitting: Internal Medicine

## 2022-06-15 ENCOUNTER — Other Ambulatory Visit: Payer: Self-pay | Admitting: Internal Medicine

## 2022-06-15 DIAGNOSIS — R944 Abnormal results of kidney function studies: Secondary | ICD-10-CM | POA: Diagnosis not present

## 2022-06-15 DIAGNOSIS — N1330 Unspecified hydronephrosis: Secondary | ICD-10-CM | POA: Diagnosis not present

## 2022-06-15 MED ORDER — PANTOPRAZOLE SODIUM 40 MG PO TBEC: 40.0000 mg | DELAYED_RELEASE_TABLET | Freq: Every day | ORAL | 1 refills | Status: DC

## 2022-06-17 ENCOUNTER — Telehealth: Payer: Self-pay | Admitting: *Deleted

## 2022-06-17 DIAGNOSIS — N1330 Unspecified hydronephrosis: Secondary | ICD-10-CM

## 2022-06-17 DIAGNOSIS — N1832 Chronic kidney disease, stage 3b: Secondary | ICD-10-CM

## 2022-06-17 NOTE — Telephone Encounter (Signed)
Pt called back and I read the message to her and she stated she will go with the provider recommendation for a urologist

## 2022-06-17 NOTE — Telephone Encounter (Signed)
Please transfer call back to nurse, or CMA. Result note from today,   Notify Mariah Moody - renal ultrasound reveals mild hydronephrosis right kidney.  This means some mild swelling/enlargement of the kidney - from possible obstruction, etc.  Some mild fullness on the left.  Given ultrasound findings, I would like to refer her to urology for further evaluation and treatment.  Let me know if agreeable and I will place the order for referral.  (Please confirm if has preference of which urologist she sees).

## 2022-06-17 NOTE — Telephone Encounter (Signed)
Patient agreed to referral to urology.

## 2022-06-18 DIAGNOSIS — N183 Chronic kidney disease, stage 3 unspecified: Secondary | ICD-10-CM | POA: Insufficient documentation

## 2022-06-18 NOTE — Telephone Encounter (Signed)
Order placed for urology referral.  Would like appt as soon as possible.  Thanks.

## 2022-06-19 NOTE — Telephone Encounter (Signed)
Thank you :)

## 2022-06-23 DIAGNOSIS — E119 Type 2 diabetes mellitus without complications: Secondary | ICD-10-CM | POA: Diagnosis not present

## 2022-06-23 LAB — HM DIABETES EYE EXAM

## 2022-07-01 ENCOUNTER — Other Ambulatory Visit: Payer: PPO

## 2022-07-07 ENCOUNTER — Ambulatory Visit: Payer: PPO | Admitting: Urology

## 2022-07-07 ENCOUNTER — Encounter: Payer: Self-pay | Admitting: Urology

## 2022-07-07 VITALS — BP 148/68 | HR 77 | Ht 62.0 in | Wt 139.0 lb

## 2022-07-07 DIAGNOSIS — N1832 Chronic kidney disease, stage 3b: Secondary | ICD-10-CM | POA: Diagnosis not present

## 2022-07-07 DIAGNOSIS — N1330 Unspecified hydronephrosis: Secondary | ICD-10-CM

## 2022-07-07 DIAGNOSIS — N281 Cyst of kidney, acquired: Secondary | ICD-10-CM | POA: Diagnosis not present

## 2022-07-07 NOTE — Progress Notes (Signed)
07/07/2022 4:37 PM   Mariah Moody 1940/11/04 [id number]  Referring provider: Einar Pheasant, [place],  [place] [id number]  Chief Complaint  Patient presents with   Hydronephrosis    HPI: 81 year old female who presents today for further evaluation of possible hydronephrosis.  She has been noted to have a slightly elevated creatinine to 1.29, repeat 1.27 previously baseline around 1.  As part of this evaluation, she had a renal ultrasound.  This showed mild right hydronephrosis on the left and possibly mild pelvic fullness on the left.  Bladder was distended during the study.  Notably, she also had an MRI of the abdomen in 06/2014 2022 which was negative.   She was noted to have a parapelvic cysts on the left side on previous CT scans dating back to 2021.  She denies any flank pain or urinary issues.  No gross hematuria.  No history of stones.  She does mention today that she was told to arrive to the ultrasound with a very full bladder.  She felt like she was extremely uncomfortable with a full bladder.  She was asked to empty her bladder and there is a post void imaging with slight improvement in her hydronephrosis.  UA today is negative, no microscopic blood.   PMH: Past Medical History:  Diagnosis Date   Anemia    Coronary atherosclerosis of native coronary vessel    Diabetes mellitus without complication (HCC)    Fibroid    GERD (gastroesophageal reflux disease)    History of chicken pox    Hyperlipidemia    Hypertension    Hypothyroidism     Surgical History: Past Surgical History:  Procedure Laterality Date   COLONOSCOPY N/A 04/15/2015   Procedure: COLONOSCOPY;  Surgeon: Hulen Luster, MD;  Location: [facility] ENDOSCOPY;  Service: Gastroenterology;  Laterality: N/A;   ESOPHAGOGASTRODUODENOSCOPY N/A 04/15/2015   Procedure: ESOPHAGOGASTRODUODENOSCOPY (EGD);  Surgeon: Hulen Luster, MD;  Location: [facility] ENDOSCOPY;  Service:  Gastroenterology;  Laterality: N/A;   TUBAL LIGATION      Home Medications:  Allergies as of 07/07/2022       Reactions   Statins    Other reaction(s): Muscle Pain   Sulfa Antibiotics    headaches   Iodine Rash        Medication List        Accurate as of July 07, 2022  4:37 PM. If you have any questions, ask your nurse or doctor.          Azelastine HCl 137 MCG/SPRAY Soln PLACE 1 SPRAY INTO BOTH NOSTRILS 2 (TWO) TIMES DAILY. USE IN EACH NOSTRIL AS DIRECTED   CombiPatch 0.05-0.25 MG/DAY Generic drug: estradiol-norethindrone Place 1 patch onto the skin 2 (two) times a week.   hydrochlorothiazide 25 MG tablet Commonly known as: HYDRODIURIL TAKE 1 TABLET BY MOUTH EVERY DAY   levothyroxine 88 MCG tablet Commonly known as: SYNTHROID TAKE 1 TABLET (88 MCG TOTAL) DAILY BY MOUTH.   levothyroxine 88 MCG tablet Commonly known as: SYNTHROID TAKE 1 TABLET (88 MCG TOTAL) DAILY BY MOUTH.   MULTIVITAMIN PO Take 1 tablet by mouth daily.   olmesartan 40 MG tablet Commonly known as: BENICAR Take 1 tablet (40 mg total) by mouth daily.   pantoprazole 40 MG tablet Commonly known as: Protonix Take 1 tablet (40 mg total) by mouth daily.   spironolactone 25 MG tablet Commonly known as: ALDACTONE TAKE 1 TABLET (25 MG TOTAL) BY MOUTH DAILY.  Allergies:  Allergies  Allergen Reactions   Statins     Other reaction(s): Muscle Pain   Sulfa Antibiotics     headaches   Iodine Rash    Family History: Family History  Problem Relation Age of Onset   Arthritis Mother    Hypertension Mother    Colon polyps Mother    Heart disease Father    Hypertension Father    Diabetes Brother    Cancer Neg Hx    Breast cancer Neg Hx     Social History:  reports that she has quit smoking. She has never used smokeless tobacco. She reports that she does not currently use alcohol. She reports that she does not use drugs.   Physical Exam: BP (!) 148/68   Pulse 77   Ht 5'  2" (1.575 m)   Wt 139 lb (63 kg)   BMI 25.42 kg/m   Constitutional:  Alert and oriented, No acute distress. HEENT:  AT, moist mucus membranes.  Trachea midline, no masses. Cardiovascular: No clubbing, cyanosis, or edema. Respiratory: Normal respiratory effort, no increased work of breathing. Neurologic: Grossly intact, no focal deficits, moving all 4 extremities. Psychiatric: Normal mood and affect.  Laboratory Data: Lab Results  Component Value Date   WBC 5.8 04/28/2022   HGB 12.2 04/28/2022   HCT 36.8 04/28/2022   MCV 95.3 04/28/2022   PLT 255.0 04/28/2022    Lab Results  Component Value Date   CREATININE 1.27 (H) 05/04/2022    Lab Results  Component Value Date   HGBA1C 6.7 (H) 04/28/2022    Urinalysis UA today is negative  Pertinent Imaging:  US Renal  Narrative CLINICAL DATA:  Decreased GFR  EXAM: RENAL / URINARY TRACT ULTRASOUND COMPLETE  COMPARISON:  MRI of the abdomen June 15, 2021. CT scan of the abdomen and pelvis May 23, 2020.  FINDINGS: Right Kidney:  Renal measurements: 9.3 x 4.7 x 5.0 cm = volume: 113 mL. Contains an 8 mm cyst. No follow-up imaging recommended for the cyst. Mild right hydronephrosis.  Left Kidney:  Renal measurements: 10.2 x 5.8 x 5.1 cm = volume: 157 mL. Contains multiple cysts with the largest measuring 6.1 cm. Multiple parapelvic cysts are identified as well. No follow-up imaging recommended for the cysts. Mild pelvic fullness not excluded.  Bladder:  Appears normal for degree of bladder distention.  Other:  None.  IMPRESSION: 1. Mild hydronephrosis on the right was not identified on previous imaging and is otherwise age indeterminate. No underlying etiology identified. Recommend clinical correlation. 2. There may be mild pelvic fullness on the left although evaluation of the left renal pelvis is difficult due to several parapelvic cysts. 3. No other abnormalities.   Electronically Signed By: Dorise Bullion III M.D. On: 06/16/2022 09:01  I personally reviewed this renal ultrasound and careful attention to the right pre and post at which time there was some slight improvement in the degree of hydronephrosis.  The bladder is markedly distended on this study.  Previous studies were also personally reviewed today in comparison and no pathology was identified.   Assessment & Plan:    1. Hydronephrosis of right kidney Fairly mild hydronephrosis with a massively distended bladder, some slight improvement with bladder emptying  Suspect this is likely iatrogenic based on the degree of bladder fullness.  It was not appreciated on previous studies which is reassuring.  Her urinalysis today is negative, no concern for underlying issues.  We will plan to repeat the renal ultrasound  in about 6 to 8 weeks this time with a completely empty bladder.  She is agreeable this plan.  If her hydronephrosis persists at this point, we will get a CT urogram although prefer to avoid the expense and amount of radiation based on low degree of suspicion.  She is agreeable with this plan and rationale. - Ultrasound renal complete; Future  2. Renal cyst, left Incidental left parapelvic cyst appreciated, no real concern for hydronephrosis on this side  3. Stage 3b chronic kidney disease ([place]) Do not suspect obstruction is an underlying causative issue, will reassess  We will call with renal ultrasound in 6 to 8 weeks, follow-up to be determined based on results  Hollice Espy, MD  [facility] [address] [place], [place] [address] [phone]

## 2022-07-08 ENCOUNTER — Encounter: Payer: Self-pay | Admitting: Internal Medicine

## 2022-07-08 DIAGNOSIS — N281 Cyst of kidney, acquired: Secondary | ICD-10-CM | POA: Insufficient documentation

## 2022-07-08 DIAGNOSIS — N1330 Unspecified hydronephrosis: Secondary | ICD-10-CM | POA: Insufficient documentation

## 2022-07-12 ENCOUNTER — Other Ambulatory Visit: Payer: Self-pay | Admitting: Internal Medicine

## 2022-08-03 ENCOUNTER — Encounter (INDEPENDENT_AMBULATORY_CARE_PROVIDER_SITE_OTHER): Payer: PPO

## 2022-08-03 ENCOUNTER — Ambulatory Visit (INDEPENDENT_AMBULATORY_CARE_PROVIDER_SITE_OTHER): Payer: PPO | Admitting: Vascular Surgery

## 2022-08-03 ENCOUNTER — Ambulatory Visit
Admission: RE | Admit: 2022-08-03 | Discharge: 2022-08-03 | Disposition: A | Discharge status: Home / Self Care | Payer: PPO | Source: Ambulatory Visit | Attending: Internal Medicine | Admitting: Internal Medicine

## 2022-08-03 DIAGNOSIS — E2839 Other primary ovarian failure: Secondary | ICD-10-CM

## 2022-08-03 DIAGNOSIS — M85851 Other specified disorders of bone density and structure, right thigh: Secondary | ICD-10-CM | POA: Diagnosis not present

## 2022-08-03 DIAGNOSIS — Z1231 Encounter for screening mammogram for malignant neoplasm of breast: Secondary | ICD-10-CM | POA: Diagnosis not present

## 2022-08-10 ENCOUNTER — Ambulatory Visit: Payer: PPO | Admitting: Internal Medicine

## 2022-08-12 ENCOUNTER — Other Ambulatory Visit (INDEPENDENT_AMBULATORY_CARE_PROVIDER_SITE_OTHER): Payer: Self-pay | Admitting: Vascular Surgery

## 2022-08-12 DIAGNOSIS — I6523 Occlusion and stenosis of bilateral carotid arteries: Secondary | ICD-10-CM

## 2022-08-16 DIAGNOSIS — I70219 Atherosclerosis of native arteries of extremities with intermittent claudication, unspecified extremity: Secondary | ICD-10-CM | POA: Insufficient documentation

## 2022-08-16 NOTE — Progress Notes (Unsigned)
MRN : [id number]  Mariah Moody is a 81 y.o. (1941-01-24) female who presents with chief complaint of check circulation.  History of Present Illness:   The patient returns to the office for followup and review of the noninvasive studies. There have been no interval changes in lower extremity symptoms. No interval shortening of the patient's claudication distance or development of rest pain symptoms. No new ulcers or wounds have occurred since the last visit.   There have been no significant changes to the patient's overall health care.   The patient is also followed for carotid stenosis The patient denies amaurosis fugax or recent TIA symptoms. There are no recent neurological changes noted.   The patient denies history of DVT, PE or superficial thrombophlebitis. The patient denies recent episodes of angina or shortness of breath.    previous ABI's Rt=0.74 and Lt=0.74(There are triphasic Doppler signals bilaterally) Duplex ultrasound of the aortailiac system shows patent aorta iliac without hemodynamically stenosis.   Duplex ultrasound of the carotid arteries shows RICA 4-09% and LICA 8-11%.  No outpatient medications have been marked as taking for the 08/17/22 encounter (Appointment) with Delana Meyer, Dolores Lory, MD.    Past Medical History:  Diagnosis Date   Anemia    Coronary atherosclerosis of native coronary vessel    Diabetes mellitus without complication ([place])    Fibroid    GERD (gastroesophageal reflux disease)    History of chicken pox    Hyperlipidemia    Hypertension    Hypothyroidism     Past Surgical History:  Procedure Laterality Date   COLONOSCOPY N/A 04/15/2015   Procedure: COLONOSCOPY;  Surgeon: Hulen Luster, MD;  Location: [facility] ENDOSCOPY;  Service: Gastroenterology;  Laterality: N/A;   ESOPHAGOGASTRODUODENOSCOPY N/A 04/15/2015   Procedure: ESOPHAGOGASTRODUODENOSCOPY (EGD);  Surgeon: Hulen Luster, MD;  Location: [facility] ENDOSCOPY;  Service:  Gastroenterology;  Laterality: N/A;   TUBAL LIGATION      Social History Social History   Tobacco Use   Smoking status: Former   Smokeless tobacco: Never  Substance Use Topics   Alcohol use: Not Currently    Comment: occas   Drug use: No    Family History Family History  Problem Relation Age of Onset   Arthritis Mother    Hypertension Mother    Colon polyps Mother    Heart disease Father    Hypertension Father    Diabetes Brother    Cancer Neg Hx    Breast cancer Neg Hx     Allergies  Allergen Reactions   Statins     Other reaction(s): Muscle Pain   Sulfa Antibiotics     headaches   Iodine Rash     REVIEW OF SYSTEMS (Negative unless checked)  Constitutional: '[]'$ Weight loss  '[]'$ Fever  '[]'$ Chills Cardiac: '[]'$ Chest pain   '[]'$ Chest pressure   '[]'$ Palpitations   '[]'$ Shortness of breath when laying flat   '[]'$ Shortness of breath with exertion. Vascular:  '[x]'$ Pain in legs with walking   '[]'$ Pain in legs at rest  '[]'$ History of DVT   '[]'$ Phlebitis   '[]'$ Swelling in legs   '[]'$ Varicose veins   '[]'$ Non-healing ulcers Pulmonary:   '[]'$ Uses home oxygen   '[]'$ Productive cough   '[]'$ Hemoptysis   '[]'$ Wheeze  '[]'$ COPD   '[]'$ Asthma Neurologic:  '[]'$ Dizziness   '[]'$ Seizures   '[]'$ History of stroke   '[]'$ History of TIA  '[]'$ Aphasia   '[]'$ Vissual changes   '[]'$ Weakness or numbness in arm   '[]'$   Weakness or numbness in leg Musculoskeletal:   '[]'$ Joint swelling   '[]'$ Joint pain   '[]'$ Low back pain Hematologic:  '[]'$ Easy bruising  '[]'$ Easy bleeding   '[]'$ Hypercoagulable state   '[]'$ Anemic Gastrointestinal:  '[]'$ Diarrhea   '[]'$ Vomiting  '[]'$ Gastroesophageal reflux/heartburn   '[]'$ Difficulty swallowing. Genitourinary:  '[]'$ Chronic kidney disease   '[]'$ Difficult urination  '[]'$ Frequent urination   '[]'$ Blood in urine Skin:  '[]'$ Rashes   '[]'$ Ulcers  Psychological:  '[]'$ History of anxiety   '[]'$  History of major depression.  Physical Examination  There were no vitals filed for this visit. There is no height or weight on file to calculate BMI. Gen: WD/WN, NAD Head: [place]/AT, No  temporalis wasting.  Ear/Nose/Throat: Hearing grossly intact, nares w/o erythema or drainage Eyes: PER, EOMI, sclera nonicteric.  Neck: Supple, no masses.  No bruit or JVD.  Pulmonary:  Good air movement, no audible wheezing, no use of accessory muscles.  Cardiac: RRR, normal S1, S2, no Murmurs. Vascular:  mild trophic changes, no open wounds Vessel Right Left  Radial Palpable Palpable  PT Not Palpable Not Palpable  DP Not Palpable Not Palpable  Gastrointestinal: soft, non-distended. No guarding/no peritoneal signs.  Musculoskeletal: M/S 5/5 throughout.  No visible deformity.  Neurologic: CN 2-12 intact. Pain and light touch intact in extremities.  Symmetrical.  Speech is fluent. Motor exam as listed above. Psychiatric: Judgment intact, Mood & affect appropriate for pt's clinical situation. Dermatologic: No rashes or ulcers noted.  No changes consistent with cellulitis.   CBC Lab Results  Component Value Date   WBC 5.8 04/28/2022   HGB 12.2 04/28/2022   HCT 36.8 04/28/2022   MCV 95.3 04/28/2022   PLT 255.0 04/28/2022    BMET    Component Value Date/Time   NA 139 05/04/2022 0752   K 4.0 05/04/2022 0752   CL 105 05/04/2022 0752   CO2 23 05/04/2022 0752   GLUCOSE 169 (H) 05/04/2022 0752   BUN 35 (H) 05/04/2022 0752   CREATININE 1.27 (H) 05/04/2022 0752   CALCIUM 9.2 05/04/2022 0752   CrCl cannot be calculated (Patient's most recent lab result is older than the maximum 21 days allowed.).  COAG No results found for: "INR", "PROTIME"  Radiology MM 3D SCREEN BREAST BILATERAL  Result Date: 08/03/2022 CLINICAL DATA:  Screening. EXAM: DIGITAL SCREENING BILATERAL MAMMOGRAM WITH TOMOSYNTHESIS AND CAD TECHNIQUE: Bilateral screening digital craniocaudal and mediolateral oblique mammograms were obtained. Bilateral screening digital breast tomosynthesis was performed. The images were evaluated with computer-aided detection. COMPARISON:  Previous exam(s). ACR Breast Density Category b:  There are scattered areas of fibroglandular density. FINDINGS: There are no findings suspicious for malignancy. IMPRESSION: No mammographic evidence of malignancy. A result letter of this screening mammogram will be mailed directly to the patient. RECOMMENDATION: Screening mammogram in one year. (Code:SM-B-01Y) BI-RADS CATEGORY  1: Negative. Electronically Signed   By: Kristopher Oppenheim M.D.   On: 08/03/2022 15:57   DG Bone Density  Result Date: 08/03/2022 EXAM: DUAL X-RAY ABSORPTIOMETRY (DXA) FOR BONE MINERAL DENSITY IMPRESSION: Your patient Giamarie Bueche completed a BMD test on 08/03/2022 using the Dickinson (software version: 14.10) manufactured by [employer]. The following summarizes the results of our evaluation. Technologist: SCE PATIENT BIOGRAPHICAL: Name: Dechelle, Attaway Patient ID: [id number] Birth Date: 1940-12-13 Height: 61.5 in. Gender: Female Exam Date: 08/03/2022 Weight: 136.5 lbs. Indications: Advanced Age, Caucasian, Diabetic, Height Loss, High Risk Meds, History of Fracture (Adult), Hypothyroid, Postmenopausal Fractures: foot Treatments: Estradiol, Levothyroxine, Multi-Vitamin DENSITOMETRY RESULTS: Site  Region     Measured Date Measured Age WHO Classification Young Adult T-score BMD         %Change vs. Previous Significant Change (*) AP Spine L1-L4 08/03/2024 79.3 Normal 2.0 1.442 g/cm2 12.0% Yes AP Spine L1-L4 04/12/2017 74.0 Normal 0.8 1.288 g/cm2 - - DualFemur Neck Right 08/03/2022 79.3 Osteopenia -1.6 0.815 g/cm2 1.9% - DualFemur Neck Right 04/12/2017 74.0 Osteopenia -1.7 0.800 g/cm2 - - DualFemur Total Mean 08/03/2022 79.3 Normal -0.9 0.896 g/cm2 -2.5% Yes DualFemur Total Mean 04/12/2017 74.0 Normal -0.7 0.919 g/cm2 - - ASSESSMENT: The BMD measured at Femur Neck Right is 0.815 g/cm2 with a T-score of -1.6. This patient is considered osteopenic according to [facility]) criteria.Compared with prior study, there has been a significant  increase in the spine. Compared with prior study, there has been a significant decrease in the total hip. The scan quality is good. World Pharmacologist [facility]) criteria for post-menopausal, Caucasian Women: Normal:                   T-score at or above -1 SD Osteopenia/low bone mass: T-score between -1 and -2.5 SD Osteoporosis:             T-score at or below -2.5 SD RECOMMENDATIONS: 1. All patients should optimize calcium and vitamin D intake. 2. Consider FDA-approved medical therapies in postmenopausal women and men aged 41 years and older, based on the following: a. A hip or vertebral(clinical or morphometric) fracture b. T-score < -2.5 at the femoral neck or spine after appropriate evaluation to exclude secondary causes c. Low bone mass (T-score between -1.0 and -2.5 at the femoral neck or spine) and a 10-year probability of a hip fracture > 3% or a 10-year probability of a major osteoporosis-related fracture > 20% based on the [place]-adapted WHO algorithm 3. Clinician judgment and/or patient preferences may indicate treatment for people with 10-year fracture probabilities above or below these levels FOLLOW-UP: People with diagnosed cases of osteoporosis or at high risk for fracture should have regular bone mineral density tests. For patients eligible for Medicare, routine testing is allowed once every 2 years. The testing frequency can be increased to one year for patients who have rapidly progressing disease, those who are receiving or discontinuing medical therapy to restore bone mass, or have additional risk factors. I have reviewed this report, and agree with the above findings. Mark A. Thornton Papas, M.D. [facility] Dear Dr Nicki Reaper, Your patient Lucila Maine completed a FRAX assessment on 08/03/2022 using the [place] (analysis version: 14.10) manufactured by [employer]. The following summarizes the results of our evaluation. PATIENT BIOGRAPHICAL: Name: Reinette, Cuneo Patient  ID: [id number] Birth Date: 05-29-1941 Height:    61.5 in. Gender:     Female    [age]       Weight:    136.5 lbs. Ethnicity:  White                            Exam Date: 08/03/2022 FRAX* RESULTS:  (version: 3.5) 10-year Probability of Fracture1 Major Osteoporotic Fracture2 Hip Fracture 20.3% 4.6% Population: [place] (Caucasian) Risk Factors: History of Fracture (Adult) Based on Femur (Right) Neck BMD 1 -The 10-year probability of fracture may be lower than reported if the patient has received treatment. 2 -Major Osteoporotic Fracture: Clinical Spine, Forearm, Hip or Shoulder *FRAX is a Materials engineer of the [employer]  of [facility], a World Pharmacologist (WHO) [place]. ASSESSMENT: The probability of a major osteoporotic fracture is 20.3% within the next ten years. The probability of a hip fracture is 4.6% within the next ten years. I have reviewed this report and agree with the above findings. Mark A. Thornton Papas, M.D. [facility] Electronically Signed   By: Lavonia Dana M.D.   On: 08/03/2022 09:36     Assessment/Plan There are no diagnoses linked to this encounter.   Hortencia Pilar, MD  08/16/2022 9:58 AM

## 2022-08-17 ENCOUNTER — Ambulatory Visit (INDEPENDENT_AMBULATORY_CARE_PROVIDER_SITE_OTHER): Payer: PPO

## 2022-08-17 ENCOUNTER — Encounter (INDEPENDENT_AMBULATORY_CARE_PROVIDER_SITE_OTHER): Payer: Self-pay | Admitting: Vascular Surgery

## 2022-08-17 ENCOUNTER — Ambulatory Visit (INDEPENDENT_AMBULATORY_CARE_PROVIDER_SITE_OTHER): Payer: PPO | Admitting: Vascular Surgery

## 2022-08-17 VITALS — BP 171/73 | HR 68 | Resp 16 | Wt 139.8 lb

## 2022-08-17 DIAGNOSIS — E1165 Type 2 diabetes mellitus with hyperglycemia: Secondary | ICD-10-CM

## 2022-08-17 DIAGNOSIS — I6523 Occlusion and stenosis of bilateral carotid arteries: Secondary | ICD-10-CM | POA: Diagnosis not present

## 2022-08-17 DIAGNOSIS — I739 Peripheral vascular disease, unspecified: Secondary | ICD-10-CM | POA: Diagnosis not present

## 2022-08-17 DIAGNOSIS — I10 Essential (primary) hypertension: Secondary | ICD-10-CM | POA: Diagnosis not present

## 2022-08-17 DIAGNOSIS — I70213 Atherosclerosis of native arteries of extremities with intermittent claudication, bilateral legs: Secondary | ICD-10-CM

## 2022-08-17 DIAGNOSIS — I779 Disorder of arteries and arterioles, unspecified: Secondary | ICD-10-CM | POA: Diagnosis not present

## 2022-08-17 DIAGNOSIS — E7800 Pure hypercholesterolemia, unspecified: Secondary | ICD-10-CM | POA: Diagnosis not present

## 2022-08-24 ENCOUNTER — Encounter (INDEPENDENT_AMBULATORY_CARE_PROVIDER_SITE_OTHER): Payer: PPO

## 2022-08-24 ENCOUNTER — Ambulatory Visit (INDEPENDENT_AMBULATORY_CARE_PROVIDER_SITE_OTHER): Payer: PPO | Admitting: Vascular Surgery

## 2022-08-31 ENCOUNTER — Encounter (INDEPENDENT_AMBULATORY_CARE_PROVIDER_SITE_OTHER): Payer: Self-pay

## 2022-09-01 ENCOUNTER — Ambulatory Visit: Payer: PPO | Admitting: Urology

## 2022-09-09 NOTE — Telephone Encounter (Signed)
Patient was called. She was exposed to RSV. She has a cough, sore throat, no fever, ears clogged, tested negitive at home for COVID. She is requesting medication. No appointments available at office.

## 2022-09-10 ENCOUNTER — Other Ambulatory Visit: Payer: Self-pay | Admitting: Internal Medicine

## 2022-09-10 ENCOUNTER — Ambulatory Visit (INDEPENDENT_AMBULATORY_CARE_PROVIDER_SITE_OTHER): Payer: PPO | Admitting: Family Medicine

## 2022-09-10 VITALS — BP 136/80 | Temp 96.9°F

## 2022-09-10 DIAGNOSIS — R051 Acute cough: Secondary | ICD-10-CM

## 2022-09-10 DIAGNOSIS — R0981 Nasal congestion: Secondary | ICD-10-CM | POA: Diagnosis not present

## 2022-09-10 MED ORDER — BENZONATATE 100 MG PO CAPS: ORAL_CAPSULE | ORAL | 0 refills | Status: DC

## 2022-09-10 NOTE — Telephone Encounter (Signed)
S/w pt - confirmed no fever, sob, chest pain Stated feels a bit better today, back at work  Agreeable to virtual with Dr Philip Aspen for 120 today

## 2022-09-10 NOTE — Progress Notes (Signed)
Virtual Visit via Telephone Note  I connected with Mariah Moody on 09/10/22 at  1:20 PM EST by telephone and verified that I am speaking with the correct person using two identifiers.   I discussed the limitations of performing an evaluation and management service by telephone and requested permission for a phone visit. The patient expressed understanding and agreed to proceed.  Location patient:  [place] Location provider: work or home office Participants present for the call: patient, provider Patient did not have a visit with me in the prior 7 days to address this/these issue(s).   History of Present Illness:  Acute telemedicine visit for upper resp symptoms: -Onset: 4 days ago -had negative covid test yesterday -Symptoms include: nasal congestion, cough, runny nose, sore throat -Denies: CP, fever, SOB -granddaughter had RSV but pt had symptoms before around her and she was over it -Pertinent past medical history: see below -Pertinent medication allergies:  Allergies  Allergen Reactions   Statins     Other reaction(s): Muscle Pain   Sulfa Antibiotics     headaches   Iodine Rash   -COVID-19 vaccine status: Immunization History  Administered Date(s) Administered   Fluad Quad(high Dose 65+) 07/12/2019, 07/30/2020, 07/28/2021   Influenza, High Dose Seasonal PF 07/27/2016, 09/19/2017, 08/28/2018   Influenza,inj,Quad PF,6+ Mos 08/06/2014, 07/30/2015   Pneumococcal Conjugate-13 05/26/2017   Pneumococcal Polysaccharide-23 04/09/2014      Past Medical History:  Diagnosis Date   Anemia    Coronary atherosclerosis of native coronary vessel    Diabetes mellitus without complication (HCC)    Fibroid    GERD (gastroesophageal reflux disease)    History of chicken pox    Hyperlipidemia    Hypertension    Hypothyroidism     Current Outpatient Medications on File Prior to Visit  Medication Sig Dispense Refill   Azelastine HCl 137 MCG/SPRAY SOLN PLACE 1 SPRAY INTO BOTH  NOSTRILS 2 (TWO) TIMES DAILY. USE IN EACH NOSTRIL AS DIRECTED 30 mL 1   estradiol-norethindrone (COMBIPATCH) 0.05-0.25 MG/DAY Place 1 patch onto the skin 2 (two) times a week. 24 patch 1   hydrochlorothiazide (HYDRODIURIL) 25 MG tablet TAKE 1 TABLET BY MOUTH EVERY DAY 90 tablet 1   levothyroxine (SYNTHROID) 88 MCG tablet TAKE 1 TABLET (88 MCG TOTAL) DAILY BY MOUTH. 90 tablet 0   Multiple Vitamins-Minerals (MULTIVITAMIN PO) Take 1 tablet by mouth daily.      olmesartan (BENICAR) 40 MG tablet TAKE 1 TABLET BY MOUTH EVERY DAY 90 tablet 0   pantoprazole (PROTONIX) 40 MG tablet Take 1 tablet (40 mg total) by mouth daily. 90 tablet 1   spironolactone (ALDACTONE) 25 MG tablet TAKE 1 TABLET (25 MG TOTAL) BY MOUTH DAILY. 90 tablet 0   No current facility-administered medications on file prior to visit.    Observations/Objective: Patient sounds cheerful and well on the phone. I do not appreciate any SOB. Speech and thought processing are grossly intact. Patient reported vitals:  Assessment and Plan:  Nasal congestion  Acute cough  -we discussed possible serious and likely etiologies, options for evaluation and workup, limitations of telemedicine visit vs in person visit, treatment, treatment risks and return/follow up precautions. Pt prefers to treat via telemedicine empirically rather than in person at this moment. Query VURI vs other. Advised to check one more covid test - if positive advised she call back today while I am still working or contact a Adult nurse or do f/u vv if another day. O/w opted for Tessalon rx for cough  and saline sinus rinse, other measures per pt instructions.  Advised to seek prompt virtual visit or in person care if worsening, new symptoms arise, or if is not improving with treatment as expected per our conversation of expected course. Discussed options for follow up care. Did let this patient know that I do telemedicine on Tuesdays and Thursdays for [place] and  those are the days I am logged into the system. Advised to schedule follow up visit with PCP, [place] virtual visits or [facility] if any further questions or concerns to avoid delays in care.   I discussed the assessment and treatment plan with the patient. The patient was provided an opportunity to ask questions and all were answered. The patient agreed with the plan and demonstrated an understanding of the instructions.    Follow Up Instructions:  I did not refer this patient for an OV with me in the next 24 hours for this/these issue(s).  I discussed the assessment and treatment plan with the patient. The patient was provided an opportunity to ask questions and all were answered. The patient agreed with the plan and demonstrated an understanding of the instructions.   I spent 13 minutes on the date of this visit in the care of this patient. See summary of tasks completed to properly care for this patient in the detailed notes above which also included counseling of above, review of PMH, medications, allergies, evaluation of the patient and ordering and/or  instructing patient on testing and care options.     Lucretia Kern, DO

## 2022-09-13 DIAGNOSIS — J0100 Acute maxillary sinusitis, unspecified: Secondary | ICD-10-CM | POA: Diagnosis not present

## 2022-09-13 DIAGNOSIS — Z6824 Body mass index (BMI) 24.0-24.9, adult: Secondary | ICD-10-CM | POA: Diagnosis not present

## 2022-09-14 ENCOUNTER — Ambulatory Visit
Admission: RE | Admit: 2022-09-14 | Discharge: 2022-09-14 | Disposition: A | Discharge status: Home / Self Care | Payer: PPO | Source: Ambulatory Visit | Attending: Urology | Admitting: Urology

## 2022-09-14 DIAGNOSIS — N1330 Unspecified hydronephrosis: Secondary | ICD-10-CM | POA: Insufficient documentation

## 2022-09-14 DIAGNOSIS — N281 Cyst of kidney, acquired: Secondary | ICD-10-CM | POA: Diagnosis not present

## 2022-09-15 ENCOUNTER — Telehealth: Payer: PPO | Admitting: Family Medicine

## 2022-09-15 ENCOUNTER — Ambulatory Visit: Payer: PPO | Admitting: Urology

## 2022-09-15 ENCOUNTER — Encounter: Payer: Self-pay | Admitting: Urology

## 2022-09-15 VITALS — BP 162/72 | HR 80 | Ht 62.0 in | Wt 139.0 lb

## 2022-09-15 DIAGNOSIS — N1832 Chronic kidney disease, stage 3b: Secondary | ICD-10-CM | POA: Diagnosis not present

## 2022-09-15 NOTE — Progress Notes (Signed)
09/15/2022 8:30 AM   [name] Jan 10, 1941 [id number]  Referring provider: Einar Pheasant, [address] [place],  [place] [address]  Chief Complaint  Patient presents with   Hydronephrosis    HPI: 81 year old female initially referred for further evaluation of rising creatinine and incidental mild hydronephrosis in the setting of distended bladder.  Please see previous notes for details.  She returns today with follow-up repeat renal ultrasound.  This indicates unremarkable kidneys without hydronephrosis when her bladder is empty at the time of this study.  Today she reports that she is doing well.  She does think that her renal dysfunction had to do with being chronically dehydrated and possibly NSAID use.  She is been volitional about drinking plenty of fluids in the recent past.  She has an appointment scheduled for repeat labs with her PCP next month.   PMH: Past Medical History:  Diagnosis Date   Anemia    Coronary atherosclerosis of native coronary vessel    Diabetes mellitus without complication (HCC)    Fibroid    GERD (gastroesophageal reflux disease)    History of chicken pox    Hyperlipidemia    Hypertension    Hypothyroidism     Surgical History: Past Surgical History:  Procedure Laterality Date   COLONOSCOPY N/A 04/15/2015   Procedure: COLONOSCOPY;  Surgeon: Hulen Luster, MD;  Location: [facility] ENDOSCOPY;  Service: Gastroenterology;  Laterality: N/A;   ESOPHAGOGASTRODUODENOSCOPY N/A 04/15/2015   Procedure: ESOPHAGOGASTRODUODENOSCOPY (EGD);  Surgeon: Hulen Luster, MD;  Location: [facility] ENDOSCOPY;  Service: Gastroenterology;  Laterality: N/A;   TUBAL LIGATION      Home Medications:  Allergies as of 09/15/2022       Reactions   Statins    Other reaction(s): Muscle Pain   Sulfa Antibiotics    headaches   Iodine Rash        Medication List        Accurate as of September 15, 2022  8:30 AM. If you have any questions, ask  your nurse or doctor.          Azelastine HCl 137 MCG/SPRAY Soln PLACE 1 SPRAY INTO BOTH NOSTRILS 2 (TWO) TIMES DAILY. USE IN EACH NOSTRIL AS DIRECTED   benzonatate 100 MG capsule Commonly known as: Tessalon Perles 1-2 capsules up to twice daily as needed for cough   CombiPatch 0.05-0.25 MG/DAY Generic drug: estradiol-norethindrone Place 1 patch onto the skin 2 (two) times a week.   hydrochlorothiazide 25 MG tablet Commonly known as: HYDRODIURIL TAKE 1 TABLET BY MOUTH EVERY DAY   levothyroxine 88 MCG tablet Commonly known as: SYNTHROID TAKE 1 TABLET (88 MCG TOTAL) DAILY BY MOUTH.   MULTIVITAMIN PO Take 1 tablet by mouth daily.   olmesartan 40 MG tablet Commonly known as: BENICAR TAKE 1 TABLET BY MOUTH EVERY DAY   pantoprazole 40 MG tablet Commonly known as: Protonix Take 1 tablet (40 mg total) by mouth daily.   spironolactone 25 MG tablet Commonly known as: ALDACTONE TAKE 1 TABLET (25 MG TOTAL) BY MOUTH DAILY.        Allergies:  Allergies  Allergen Reactions   Statins     Other reaction(s): Muscle Pain   Sulfa Antibiotics     headaches   Iodine Rash    Family History: Family History  Problem Relation Age of Onset   Arthritis Mother    Hypertension Mother    Colon polyps Mother    Heart disease Father    Hypertension  Father    Diabetes Brother    Cancer Neg Hx    Breast cancer Neg Hx     Social History:  reports that she has quit smoking. She has never used smokeless tobacco. She reports that she does not currently use alcohol. She reports that she does not use drugs.   Physical Exam: BP (!) 162/72   Pulse 80   Ht '5\' 2"'$  (1.575 m)   Wt 139 lb (63 kg)   BMI 25.42 kg/m   Constitutional:  Alert and oriented, No acute distress. HEENT: [place] AT, moist mucus membranes.  Trachea midline, no masses. Cardiovascular: No clubbing, cyanosis, or edema. Neurologic: Grossly intact, no focal deficits, moving all 4 extremities. Psychiatric: Normal mood and  affect.  Laboratory Data: Lab Results  Component Value Date   WBC 5.8 04/28/2022   HGB 12.2 04/28/2022   HCT 36.8 04/28/2022   MCV 95.3 04/28/2022   PLT 255.0 04/28/2022    Lab Results  Component Value Date   CREATININE 1.27 (H) 05/04/2022     Pertinent Imaging:  Ultrasound renal complete  Narrative CLINICAL DATA:  History of hydronephrosis.  EXAM: RENAL / URINARY TRACT ULTRASOUND COMPLETE  COMPARISON:  Renal ultrasound June 15, 2022. CT scan May 23, 2020.  FINDINGS: Right Kidney:  Renal measurements: 8.5 x 4.2 x 5.3 cm = volume: 100 mL. Contains an 8 mm cyst. No follow-up imaging recommended for the cyst. No hydronephrosis identified.  Left Kidney:  Renal measurements: 10.2 x 4.7 x 5.5 cm = volume: 136 mL. Contains multiple simple cortical and parapelvic cysts with the largest measuring 6.2 cm. No follow-up imaging recommended for the cysts.  Bladder:  The bladder is not well evaluated due to lack of distention. No obvious abnormalities in the region the bladder.  Other:  None.  IMPRESSION: 1. The kidneys are unremarkable. No hydronephrosis identified. 2. The bladder is not well evaluated due to lack of distention. No obvious abnormalities in the region the bladder.   Electronically Signed By: Dorise Bullion III M.D. On: 09/14/2022 14:49   Assessment & Plan:    1. Stage 3b chronic kidney disease ([place]) Hydronephrosis has resolved, suspect this was likely iatrogenic in the setting of massively distended bladder on her initial study; resolved with adequate bladder emptying as seen on follow-up study  Labs to be repeated by PCP, agree with increased fluid intake as well as avoidance of chronic NSAIDs along with avoidance of other nephrotoxic agents  Follow-up as needed   Hollice Espy, MD  [facility] [address] [place], [place] [address] [phone]

## 2022-09-20 ENCOUNTER — Other Ambulatory Visit: Payer: Self-pay | Admitting: Internal Medicine

## 2022-10-12 ENCOUNTER — Telehealth: Payer: Self-pay | Admitting: Internal Medicine

## 2022-10-12 ENCOUNTER — Other Ambulatory Visit: Payer: Self-pay | Admitting: Family

## 2022-10-12 ENCOUNTER — Ambulatory Visit: Payer: PPO | Admitting: Internal Medicine

## 2022-10-12 NOTE — Progress Notes (Deleted)
Patient ID: Mariah Moody, female   DOB: [birth year], 81 y.o.   MRN: [id number]   Subjective:    Patient ID: Mariah Moody, female    DOB: 1941/06/11, 81 y.o.   MRN: [id number]   Patient here for No chief complaint on file.  Mariah Moody   HPI Here to follow up regarding her blood pressure, blood sugar and cholesterol.  Previous hand pain.  Referred to Dr Peggye Ley. Saw Dr Lucky Cowboy - f/u carotid artery disease.  Stable - 1-39% bilaterally.  Recommended f/u in 5 years.   Saw Dr Erlene Quan - evaluation hydronephrosis - noted on renal ultrasound.  Hydronephrosis resolved - felt to be related to massively distended bladder on initial study.  Resolved with adequate bladder emptying on follow up study.     Past Medical History:  Diagnosis Date   Anemia    Coronary atherosclerosis of native coronary vessel    Diabetes mellitus without complication (HCC)    Fibroid    GERD (gastroesophageal reflux disease)    History of chicken pox    Hyperlipidemia    Hypertension    Hypothyroidism    Past Surgical History:  Procedure Laterality Date   COLONOSCOPY N/A 04/15/2015   Procedure: COLONOSCOPY;  Surgeon: Hulen Luster, MD;  Location: [facility] ENDOSCOPY;  Service: Gastroenterology;  Laterality: N/A;   ESOPHAGOGASTRODUODENOSCOPY N/A 04/15/2015   Procedure: ESOPHAGOGASTRODUODENOSCOPY (EGD);  Surgeon: Hulen Luster, MD;  Location: [facility] ENDOSCOPY;  Service: Gastroenterology;  Laterality: N/A;   TUBAL LIGATION     Family History  Problem Relation Age of Onset   Arthritis Mother    Hypertension Mother    Colon polyps Mother    Heart disease Father    Hypertension Father    Diabetes Brother    Cancer Neg Hx    Breast cancer Neg Hx    Social History   Socioeconomic History   Marital status: Married    Spouse name: Not on file   Number of children: Not on file   Years of education: Not on file   Highest education level: Not on file  Occupational History   Not on file  Tobacco Use   Smoking status: Former    Smokeless tobacco: Never  Substance and Sexual Activity   Alcohol use: Not Currently    Comment: occas   Drug use: No   Sexual activity: Not Currently  Other Topics Concern   Not on file  Social History Narrative   Not on file   Social Determinants of Health   Financial Resource Strain: Not on file  Food Insecurity: Not on file  Transportation Needs: Not on file  Physical Activity: Not on file  Stress: Not on file  Social Connections: Not on file     Review of Systems     Objective:     There were no vitals taken for this visit. Wt Readings from Last 3 Encounters:  09/15/22 139 lb (63 kg)  08/17/22 139 lb 12.8 oz (63.4 kg)  07/07/22 139 lb (63 kg)    Physical Exam   Outpatient Encounter Medications as of 10/12/2022  Medication Sig   Azelastine HCl 137 MCG/SPRAY SOLN PLACE 1 SPRAY INTO BOTH NOSTRILS 2 (TWO) TIMES DAILY. USE IN EACH NOSTRIL AS DIRECTED   benzonatate (TESSALON PERLES) 100 MG capsule 1-2 capsules up to twice daily as needed for cough   estradiol-norethindrone (COMBIPATCH) 0.05-0.25 MG/DAY Place 1 patch onto the skin 2 (two) times a week.   hydrochlorothiazide (HYDRODIURIL) 25 MG tablet  TAKE 1 TABLET BY MOUTH EVERY DAY   levothyroxine (SYNTHROID) 88 MCG tablet TAKE 1 TABLET (88 MCG TOTAL) DAILY BY MOUTH.   Multiple Vitamins-Minerals (MULTIVITAMIN PO) Take 1 tablet by mouth daily.    olmesartan (BENICAR) 40 MG tablet TAKE 1 TABLET BY MOUTH EVERY DAY   pantoprazole (PROTONIX) 40 MG tablet Take 1 tablet (40 mg total) by mouth daily.   spironolactone (ALDACTONE) 25 MG tablet TAKE 1 TABLET (25 MG TOTAL) BY MOUTH DAILY.   No facility-administered encounter medications on file as of 10/12/2022.     Lab Results  Component Value Date   WBC 5.8 04/28/2022   HGB 12.2 04/28/2022   HCT 36.8 04/28/2022   PLT 255.0 04/28/2022   GLUCOSE 169 (H) 05/04/2022   CHOL 229 (H) 04/28/2022   TRIG (H) 04/28/2022    410.0 Triglyceride is over 400; calculations on Lipids  are invalid.   HDL 33.50 (L) 04/28/2022   LDLDIRECT 106.0 04/28/2022   LDLCALC 139 (H) 12/02/2020   ALT 11 04/28/2022   AST 13 04/28/2022   NA 139 05/04/2022   K 4.0 05/04/2022   CL 105 05/04/2022   CREATININE 1.27 (H) 05/04/2022   BUN 35 (H) 05/04/2022   CO2 23 05/04/2022   TSH 3.56 04/28/2022   HGBA1C 6.7 (H) 04/28/2022   MICROALBUR <0.7 04/28/2022    Ultrasound renal complete  Result Date: 09/14/2022 CLINICAL DATA:  History of hydronephrosis. EXAM: RENAL / URINARY TRACT ULTRASOUND COMPLETE COMPARISON:  Renal ultrasound June 15, 2022. CT scan May 23, 2020. FINDINGS: Right Kidney: Renal measurements: 8.5 x 4.2 x 5.3 cm = volume: 100 mL. Contains an 8 mm cyst. No follow-up imaging recommended for the cyst. No hydronephrosis identified. Left Kidney: Renal measurements: 10.2 x 4.7 x 5.5 cm = volume: 136 mL. Contains multiple simple cortical and parapelvic cysts with the largest measuring 6.2 cm. No follow-up imaging recommended for the cysts. Bladder: The bladder is not well evaluated due to lack of distention. No obvious abnormalities in the region the bladder. Other: None. IMPRESSION: 1. The kidneys are unremarkable. No hydronephrosis identified. 2. The bladder is not well evaluated due to lack of distention. No obvious abnormalities in the region the bladder. Electronically Signed   By: Dorise Bullion III M.D.   On: 09/14/2022 14:49       Assessment & Plan:   Problem List Items Addressed This Visit   None    Einar Pheasant, MD

## 2022-10-12 NOTE — Telephone Encounter (Signed)
Patient came into office and is requesting the following refill: olmesartan (BENICAR) 40 MG tablet

## 2022-10-12 NOTE — Telephone Encounter (Signed)
sent 

## 2022-11-24 ENCOUNTER — Ambulatory Visit (INDEPENDENT_AMBULATORY_CARE_PROVIDER_SITE_OTHER): Payer: PPO | Admitting: Internal Medicine

## 2022-11-24 ENCOUNTER — Encounter: Payer: Self-pay | Admitting: Internal Medicine

## 2022-11-24 VITALS — BP 132/72 | HR 74 | Temp 98.0°F | Resp 16 | Ht 62.0 in | Wt 140.0 lb

## 2022-11-24 DIAGNOSIS — D1803 Hemangioma of intra-abdominal structures: Secondary | ICD-10-CM | POA: Diagnosis not present

## 2022-11-24 DIAGNOSIS — E7800 Pure hypercholesterolemia, unspecified: Secondary | ICD-10-CM

## 2022-11-24 DIAGNOSIS — K219 Gastro-esophageal reflux disease without esophagitis: Secondary | ICD-10-CM | POA: Diagnosis not present

## 2022-11-24 DIAGNOSIS — N1832 Chronic kidney disease, stage 3b: Secondary | ICD-10-CM

## 2022-11-24 DIAGNOSIS — I739 Peripheral vascular disease, unspecified: Secondary | ICD-10-CM | POA: Diagnosis not present

## 2022-11-24 DIAGNOSIS — E1165 Type 2 diabetes mellitus with hyperglycemia: Secondary | ICD-10-CM | POA: Diagnosis not present

## 2022-11-24 DIAGNOSIS — N1330 Unspecified hydronephrosis: Secondary | ICD-10-CM

## 2022-11-24 DIAGNOSIS — E039 Hypothyroidism, unspecified: Secondary | ICD-10-CM | POA: Diagnosis not present

## 2022-11-24 DIAGNOSIS — R9389 Abnormal findings on diagnostic imaging of other specified body structures: Secondary | ICD-10-CM | POA: Diagnosis not present

## 2022-11-24 DIAGNOSIS — I10 Essential (primary) hypertension: Secondary | ICD-10-CM

## 2022-11-24 DIAGNOSIS — I779 Disorder of arteries and arterioles, unspecified: Secondary | ICD-10-CM

## 2022-11-24 NOTE — Assessment & Plan Note (Signed)
Dr Delana Meyer - 08/17/22: ABI's Rt=0.86 and Lt=0.87 (TBI's are also improved.  There are biphasic Doppler signals bilaterally) (previous ABI's Rt=0.74 and Lt=0.74(There are biphasic Doppler signals bilaterally))   Previous duplex ultrasound of the aortailiac system shows patent aorta iliac without hemodynamically stenosis.

## 2022-11-24 NOTE — Progress Notes (Addendum)
Subjective:    Patient ID: Mariah Moody, female    DOB: November 24, 1940, 82 y.o.   MRN: [id number]  Patient here for  Chief Complaint  Patient presents with   Medical Management of Chronic Issues    HPI Here to follow up regarding hypercholesterolemia and hypertension.  On omesartan, hctz and aldactone.  Saw Dr Erlene Quan 07/07/22 - evaluation - hydronephrosis. Recommended f/u renal ultrasound 6-8 weeks. F/u ultrasound - hydronephrosis resolved.  She is staying active.  No chest pain or sob reported.   She does report acid reflux.  She is taking 40 mg of Protonix.  Taking '20mg'$  of an otc antiacid at night.  Feels needs something at night.  Has to eat later with her work.  Discussed avoiding foods that aggravate.  No abdominal pain.  No bowel change reported. States blood pressures are averaging in 130Q systolic.    Past Medical History:  Diagnosis Date   Anemia    Coronary atherosclerosis of native coronary vessel    Diabetes mellitus without complication (HCC)    Fibroid    GERD (gastroesophageal reflux disease)    History of chicken pox    Hyperlipidemia    Hypertension    Hypothyroidism    Past Surgical History:  Procedure Laterality Date   COLONOSCOPY N/A 04/15/2015   Procedure: COLONOSCOPY;  Surgeon: Hulen Luster, MD;  Location: [facility] ENDOSCOPY;  Service: Gastroenterology;  Laterality: N/A;   ESOPHAGOGASTRODUODENOSCOPY N/A 04/15/2015   Procedure: ESOPHAGOGASTRODUODENOSCOPY (EGD);  Surgeon: Hulen Luster, MD;  Location: [facility] ENDOSCOPY;  Service: Gastroenterology;  Laterality: N/A;   TUBAL LIGATION     Family History  Problem Relation Age of Onset   Arthritis Mother    Hypertension Mother    Colon polyps Mother    Heart disease Father    Hypertension Father    Diabetes Brother    Cancer Neg Hx    Breast cancer Neg Hx    Social History   Socioeconomic History   Marital status: Married    Spouse name: Not on file   Number of children: Not on file   Years of education: Not on  file   Highest education level: Not on file  Occupational History   Not on file  Tobacco Use   Smoking status: Former   Smokeless tobacco: Never  Substance and Sexual Activity   Alcohol use: Not Currently    Comment: occas   Drug use: No   Sexual activity: Not Currently  Other Topics Concern   Not on file  Social History Narrative   Not on file   Social Determinants of Health   Financial Resource Strain: Not on file  Food Insecurity: Not on file  Transportation Needs: Not on file  Physical Activity: Not on file  Stress: Not on file  Social Connections: Not on file     Review of Systems  Constitutional:  Negative for appetite change and unexpected weight change.  HENT:  Negative for congestion and sinus pressure.   Respiratory:  Negative for cough, chest tightness and shortness of breath.   Cardiovascular:  Negative for chest pain, palpitations and leg swelling.  Gastrointestinal:  Negative for abdominal pain, diarrhea, nausea and vomiting.       Acid reflux as outlined.   Genitourinary:  Negative for difficulty urinating and dysuria.  Musculoskeletal:  Negative for joint swelling and myalgias.  Skin:  Negative for color change and rash.  Neurological:  Negative for dizziness, light-headedness and headaches.  Psychiatric/Behavioral:  Negative for agitation and dysphoric mood.        Objective:     BP 132/72   Pulse 74   Temp 98 F (36.7 C)   Resp 16   Ht '5\' 2"'$  (1.575 m)   Wt 140 lb (63.5 kg)   SpO2 99%   BMI 25.61 kg/m  Wt Readings from Last 3 Encounters:  11/24/22 140 lb (63.5 kg)  09/15/22 139 lb (63 kg)  08/17/22 139 lb 12.8 oz (63.4 kg)    Physical Exam Vitals reviewed.  Constitutional:      General: She is not in acute distress.    Appearance: Normal appearance.  HENT:     Head: Normocephalic and atraumatic.     Right Ear: External ear normal.     Left Ear: External ear normal.  Eyes:     General: No scleral icterus.       Right eye: No  discharge.        Left eye: No discharge.     Conjunctiva/sclera: Conjunctivae normal.  Neck:     Thyroid: No thyromegaly.  Cardiovascular:     Rate and Rhythm: Normal rate and regular rhythm.  Pulmonary:     Effort: No respiratory distress.     Breath sounds: Normal breath sounds. No wheezing.  Abdominal:     General: Bowel sounds are normal.     Palpations: Abdomen is soft.     Tenderness: There is no abdominal tenderness.  Musculoskeletal:        General: No swelling or tenderness.     Cervical back: Neck supple. No tenderness.  Lymphadenopathy:     Cervical: No cervical adenopathy.  Skin:    Findings: No erythema or rash.  Neurological:     Mental Status: She is alert.  Psychiatric:        Mood and Affect: Mood normal.        Behavior: Behavior normal.      Outpatient Encounter Medications as of 11/24/2022  Medication Sig   pantoprazole (PROTONIX) 20 MG tablet Take 1 tablet (20 mg total) by mouth daily. Take in the evening.   Azelastine HCl 137 MCG/SPRAY SOLN PLACE 1 SPRAY INTO BOTH NOSTRILS 2 (TWO) TIMES DAILY. USE IN EACH NOSTRIL AS DIRECTED   estradiol-norethindrone (COMBIPATCH) 0.05-0.25 MG/DAY Place 1 patch onto the skin 2 (two) times a week.   hydrochlorothiazide (HYDRODIURIL) 25 MG tablet TAKE 1 TABLET BY MOUTH EVERY DAY   levothyroxine (SYNTHROID) 88 MCG tablet TAKE 1 TABLET (88 MCG TOTAL) DAILY BY MOUTH.   Multiple Vitamins-Minerals (MULTIVITAMIN PO) Take 1 tablet by mouth daily.    olmesartan (BENICAR) 40 MG tablet TAKE 1 TABLET BY MOUTH EVERY DAY   spironolactone (ALDACTONE) 25 MG tablet TAKE 1 TABLET (25 MG TOTAL) BY MOUTH DAILY.   [DISCONTINUED] benzonatate (TESSALON PERLES) 100 MG capsule 1-2 capsules up to twice daily as needed for cough   [DISCONTINUED] pantoprazole (PROTONIX) 40 MG tablet Take 1 tablet (40 mg total) by mouth daily.   No facility-administered encounter medications on file as of 11/24/2022.     Lab Results  Component Value Date   WBC  5.8 04/28/2022   HGB 12.2 04/28/2022   HCT 36.8 04/28/2022   PLT 255.0 04/28/2022   GLUCOSE 169 (H) 05/04/2022   CHOL 229 (H) 04/28/2022   TRIG (H) 04/28/2022    410.0 Triglyceride is over 400; calculations on Lipids are invalid.   HDL 33.50 (L) 04/28/2022   LDLDIRECT 106.0 04/28/2022   [place]  139 (H) 12/02/2020   ALT 11 04/28/2022   AST 13 04/28/2022   NA 139 05/04/2022   K 4.0 05/04/2022   CL 105 05/04/2022   CREATININE 1.27 (H) 05/04/2022   BUN 35 (H) 05/04/2022   CO2 23 05/04/2022   TSH 3.56 04/28/2022   HGBA1C 6.7 (H) 04/28/2022   MICROALBUR <0.7 04/28/2022    Ultrasound renal complete  Result Date: 09/14/2022 CLINICAL DATA:  History of hydronephrosis. EXAM: RENAL / URINARY TRACT ULTRASOUND COMPLETE COMPARISON:  Renal ultrasound June 15, 2022. CT scan May 23, 2020. FINDINGS: Right Kidney: Renal measurements: 8.5 x 4.2 x 5.3 cm = volume: 100 mL. Contains an 8 mm cyst. No follow-up imaging recommended for the cyst. No hydronephrosis identified. Left Kidney: Renal measurements: 10.2 x 4.7 x 5.5 cm = volume: 136 mL. Contains multiple simple cortical and parapelvic cysts with the largest measuring 6.2 cm. No follow-up imaging recommended for the cysts. Bladder: The bladder is not well evaluated due to lack of distention. No obvious abnormalities in the region the bladder. Other: None. IMPRESSION: 1. The kidneys are unremarkable. No hydronephrosis identified. 2. The bladder is not well evaluated due to lack of distention. No obvious abnormalities in the region the bladder. Electronically Signed   By: Dorise Bullion III M.D.   On: 09/14/2022 14:49       Assessment & Plan:  PAD (peripheral artery disease) [facility]) Assessment & Plan: Dr Delana Meyer - 08/17/22: ABI's Rt=0.86 and Lt=0.87 (TBI's are also improved.  There are biphasic Doppler signals bilaterally) (previous ABI's Rt=0.74 and Lt=0.74(There are biphasic Doppler signals bilaterally))   Previous duplex ultrasound of the  aortailiac system shows patent aorta iliac without hemodynamically stenosis.   Type 2 diabetes mellitus with hyperglycemia, without long-term current use of insulin ([place]) Assessment & Plan: She watches what she eats.  Low carb diet and exercise.  Stays active.  Check metabolic panel and G8U.   Orders: -     Hemoglobin A1c; Future  Hypothyroidism, unspecified type Assessment & Plan: On thyroid replacement.  Follow tsh.   Orders: -     TSH; Future  Hypercholesterolemia Assessment & Plan: Has declined cholesterol medication.  Continue low cholesterol diet and exercise.  Follow lipid panel.   Orders: -     Hepatic function panel; Future -     Lipid panel; Future  Essential hypertension, benign Assessment & Plan: Blood pressure better. Continue olmesartan, hctz. Had problems with amlodipine.  Intolerance to hydralazine.  On spironolactone '25mg'$  q day.  Blood pressure improved.  Follow pressures.  Follow metabolic panel.   Orders: -     Basic metabolic panel; Future  Carotid artery disease, unspecified laterality, unspecified type [facility]) Assessment & Plan: Dr Delana Meyer 08/17/22 - Duplex ultrasound shows 1-39% stenosis bilaterally. F/u 5 years.    Stage 3b chronic kidney disease ([place]) Assessment & Plan: Avoid antiinflammatories.  Stay hydrated.  Follow metabolic panel.    Gastroesophageal reflux disease, unspecified whether esophagitis present Assessment & Plan: Taking protonix '40mg'$  q day.  Request '20mg'$  q pm to help with symptoms. Follow.    Hemangioma of intra-abdominal structure Assessment & Plan: MRI 06/15/21 - unchanged.  Recommended f/u in 12 months.  Overdue.    Hydronephrosis of right kidney Assessment & Plan: 09/2022 - Hydronephrosisresolved, suspect this was likely iatrogenic in the setting of massively distended bladder on her initial study; resolved with adequate bladder emptying    Thickened endometrium Assessment & Plan: Dr Amalia Hailey (02/03/22) - discussed  endometrial biopsy.  See  note.  States decided to hold. Continue HRT.    Other orders -     Pantoprazole Sodium; Take 1 tablet (20 mg total) by mouth daily. Take in the evening.  Dispense: 90 tablet; Refill: 1     Einar Pheasant, MD

## 2022-11-27 ENCOUNTER — Telehealth: Payer: Self-pay | Admitting: Internal Medicine

## 2022-11-27 ENCOUNTER — Other Ambulatory Visit: Payer: Self-pay

## 2022-11-27 MED ORDER — PANTOPRAZOLE SODIUM 40 MG PO TBEC: 40.0000 mg | DELAYED_RELEASE_TABLET | Freq: Every day | ORAL | 1 refills | Status: DC

## 2022-11-27 NOTE — Telephone Encounter (Signed)
sent 

## 2022-11-27 NOTE — Telephone Encounter (Signed)
Prescription Request  11/27/2022  Is this a "Controlled Substance" medicine? No  LOV: 11/24/2022  What is the name of the medication or equipment? pantoprazole (PROTONIX)  MG tablet, she needs the '20mg'$  not the '40mg'$   Have you contacted your pharmacy to request a refill? Yes   Which pharmacy would you like this sent to?  [id number] [id number]: [phone]: [phone]    Patient notified that their request is being sent to the clinical staff for review and that they should receive a response within 2 business days.   Please advise at Mobile [phone](mobile)

## 2022-11-29 ENCOUNTER — Telehealth: Payer: Self-pay | Admitting: Internal Medicine

## 2022-11-29 ENCOUNTER — Encounter: Payer: Self-pay | Admitting: Internal Medicine

## 2022-11-29 MED ORDER — PANTOPRAZOLE SODIUM 20 MG PO TBEC: 20.0000 mg | DELAYED_RELEASE_TABLET | Freq: Every day | ORAL | 1 refills | Status: DC

## 2022-11-29 NOTE — Assessment & Plan Note (Signed)
Blood pressure better. Continue olmesartan, hctz. Had problems with amlodipine.  Intolerance to hydralazine.  On spironolactone '25mg'$  q day.  Blood pressure improved.  Follow pressures.  Follow metabolic panel.

## 2022-11-29 NOTE — Assessment & Plan Note (Signed)
Has declined cholesterol medication.  Continue low cholesterol diet and exercise.  Follow lipid panel.

## 2022-11-29 NOTE — Assessment & Plan Note (Addendum)
Taking protonix '40mg'$  q day.  Request '20mg'$  q pm to help with symptoms. Follow.

## 2022-11-29 NOTE — Assessment & Plan Note (Signed)
Dr Amalia Hailey (02/03/22) - discussed endometrial biopsy.  See note.  States decided to hold. Continue HRT.

## 2022-11-29 NOTE — Telephone Encounter (Signed)
Please notify - I reviewed her chart and she had a MRI to f/u liver lesion. They had recommended a f/u MRI after one year.  She is overdue.  Would like to schedule if agreeable.  Let me know if agreeable and I will place order.  Also, she is scheduled for labs in February. Could she come in earlier for fasting labs?  Orders are in, just would like to go ahead and check cholesterol, kidney function,e etc.

## 2022-11-29 NOTE — Addendum Note (Signed)
Addended by: Alisa Graff on: 11/29/2022 06:02 AM   Modules accepted: Orders

## 2022-11-29 NOTE — Assessment & Plan Note (Signed)
Dr Delana Meyer 08/17/22 - Duplex ultrasound shows 1-39% stenosis bilaterally. F/u 5 years.

## 2022-11-29 NOTE — Assessment & Plan Note (Signed)
Avoid antiinflammatories.  Stay hydrated.  Follow metabolic panel.

## 2022-11-29 NOTE — Assessment & Plan Note (Signed)
She watches what she eats.  Low carb diet and exercise.  Stays active.  Check metabolic panel and G2I.

## 2022-11-29 NOTE — Assessment & Plan Note (Addendum)
MRI 06/15/21 - unchanged.  Recommended f/u in 12 months.  Overdue.

## 2022-11-29 NOTE — Assessment & Plan Note (Signed)
09/2022 - Hydronephrosisresolved, suspect this was likely iatrogenic in the setting of massively distended bladder on her initial study; resolved with adequate bladder emptying

## 2022-11-29 NOTE — Assessment & Plan Note (Signed)
On thyroid replacement.  Follow tsh.  

## 2022-11-30 NOTE — Telephone Encounter (Signed)
LMTCB

## 2022-12-08 NOTE — Telephone Encounter (Signed)
FYI   Spoke with patient. She says she will get the MRI done but she is not ready to have done right now. She has too much going on and will do it at a later date. Also declined coming in sooner for fasting labs.

## 2022-12-18 ENCOUNTER — Other Ambulatory Visit: Payer: Self-pay | Admitting: Internal Medicine

## 2022-12-22 ENCOUNTER — Other Ambulatory Visit (INDEPENDENT_AMBULATORY_CARE_PROVIDER_SITE_OTHER): Payer: PPO

## 2022-12-22 DIAGNOSIS — I10 Essential (primary) hypertension: Secondary | ICD-10-CM | POA: Diagnosis not present

## 2022-12-22 DIAGNOSIS — E039 Hypothyroidism, unspecified: Secondary | ICD-10-CM | POA: Diagnosis not present

## 2022-12-22 DIAGNOSIS — E7800 Pure hypercholesterolemia, unspecified: Secondary | ICD-10-CM | POA: Diagnosis not present

## 2022-12-22 DIAGNOSIS — E1165 Type 2 diabetes mellitus with hyperglycemia: Secondary | ICD-10-CM | POA: Diagnosis not present

## 2022-12-22 DIAGNOSIS — I70213 Atherosclerosis of native arteries of extremities with intermittent claudication, bilateral legs: Secondary | ICD-10-CM

## 2022-12-22 LAB — LIPID PANEL
Cholesterol: 182 mg/dL (ref 0–200)
HDL: 30.2 mg/dL — ABNORMAL LOW (ref >39.00)
NonHDL: 151.89
Total CHOL/HDL Ratio: 6
Triglycerides: 225 mg/dL — ABNORMAL HIGH (ref 0.0–149.0)
VLDL: 45 mg/dL — ABNORMAL HIGH (ref 0.0–40.0)

## 2022-12-22 LAB — BASIC METABOLIC PANEL
BUN: 28 mg/dL — ABNORMAL HIGH (ref 6–23)
CO2: 27 mEq/L (ref 19–32)
Calcium: 9.5 mg/dL (ref 8.4–10.5)
Chloride: 104 mEq/L (ref 96–112)
Creatinine, Ser: 1.18 mg/dL (ref 0.40–1.20)
GFR: 43.85 mL/min — ABNORMAL LOW (ref >60.00)
Glucose, Bld: 153 mg/dL — ABNORMAL HIGH (ref 70–99)
Potassium: 3.9 mEq/L (ref 3.5–5.1)
Sodium: 140 mEq/L (ref 135–145)

## 2022-12-22 LAB — HEPATIC FUNCTION PANEL
ALT: 13 U/L (ref 0–35)
AST: 16 U/L (ref 0–37)
Albumin: 3.9 g/dL (ref 3.5–5.2)
Alkaline Phosphatase: 88 U/L (ref 39–117)
Bilirubin, Direct: 0.1 mg/dL (ref 0.0–0.3)
Total Bilirubin: 0.5 mg/dL (ref 0.2–1.2)
Total Protein: 6.5 g/dL (ref 6.0–8.3)

## 2022-12-22 LAB — TSH: TSH: 5.32 u[IU]/mL (ref 0.35–5.50)

## 2022-12-22 LAB — LDL CHOLESTEROL, DIRECT: Direct LDL: 104 mg/dL

## 2022-12-22 LAB — HEMOGLOBIN A1C: Hgb A1c MFr Bld: 6.8 % — ABNORMAL HIGH (ref 4.6–6.5)

## 2023-02-01 ENCOUNTER — Ambulatory Visit (INDEPENDENT_AMBULATORY_CARE_PROVIDER_SITE_OTHER): Payer: PPO | Admitting: Internal Medicine

## 2023-02-01 ENCOUNTER — Encounter: Payer: Self-pay | Admitting: Internal Medicine

## 2023-02-01 VITALS — BP 138/78 | HR 70 | Temp 98.2°F | Resp 16 | Ht 62.0 in | Wt 141.0 lb

## 2023-02-01 DIAGNOSIS — E1165 Type 2 diabetes mellitus with hyperglycemia: Secondary | ICD-10-CM | POA: Diagnosis not present

## 2023-02-01 DIAGNOSIS — E7800 Pure hypercholesterolemia, unspecified: Secondary | ICD-10-CM

## 2023-02-01 DIAGNOSIS — D1803 Hemangioma of intra-abdominal structures: Secondary | ICD-10-CM | POA: Diagnosis not present

## 2023-02-01 DIAGNOSIS — N1330 Unspecified hydronephrosis: Secondary | ICD-10-CM

## 2023-02-01 DIAGNOSIS — Z83719 Family history of colon polyps, unspecified: Secondary | ICD-10-CM | POA: Diagnosis not present

## 2023-02-01 DIAGNOSIS — I10 Essential (primary) hypertension: Secondary | ICD-10-CM | POA: Diagnosis not present

## 2023-02-01 DIAGNOSIS — K219 Gastro-esophageal reflux disease without esophagitis: Secondary | ICD-10-CM | POA: Diagnosis not present

## 2023-02-01 DIAGNOSIS — N1832 Chronic kidney disease, stage 3b: Secondary | ICD-10-CM | POA: Diagnosis not present

## 2023-02-01 DIAGNOSIS — I779 Disorder of arteries and arterioles, unspecified: Secondary | ICD-10-CM | POA: Diagnosis not present

## 2023-02-01 DIAGNOSIS — I739 Peripheral vascular disease, unspecified: Secondary | ICD-10-CM

## 2023-02-01 DIAGNOSIS — R9389 Abnormal findings on diagnostic imaging of other specified body structures: Secondary | ICD-10-CM | POA: Diagnosis not present

## 2023-02-01 DIAGNOSIS — E039 Hypothyroidism, unspecified: Secondary | ICD-10-CM

## 2023-02-01 DIAGNOSIS — M5431 Sciatica, right side: Secondary | ICD-10-CM

## 2023-02-01 DIAGNOSIS — M5432 Sciatica, left side: Secondary | ICD-10-CM

## 2023-02-01 LAB — BASIC METABOLIC PANEL
BUN: 28 mg/dL — ABNORMAL HIGH (ref 6–23)
CO2: 27 mEq/L (ref 19–32)
Calcium: 9.7 mg/dL (ref 8.4–10.5)
Chloride: 107 mEq/L (ref 96–112)
Creatinine, Ser: 1.13 mg/dL (ref 0.40–1.20)
GFR: 46.15 mL/min — ABNORMAL LOW (ref >60.00)
Glucose, Bld: 152 mg/dL — ABNORMAL HIGH (ref 70–99)
Potassium: 4.3 mEq/L (ref 3.5–5.1)
Sodium: 139 mEq/L (ref 135–145)

## 2023-02-01 MED ORDER — METHYLPREDNISOLONE 4 MG PO TBPK: ORAL_TABLET | ORAL | 0 refills | Status: DC

## 2023-02-01 MED ORDER — LORATADINE 10 MG PO TABS: 10.0000 mg | ORAL_TABLET | Freq: Every day | ORAL | 1 refills | Status: DC | PRN

## 2023-02-01 MED ORDER — PANTOPRAZOLE SODIUM 20 MG PO TBEC: 20.0000 mg | DELAYED_RELEASE_TABLET | Freq: Every day | ORAL | 1 refills | Status: DC

## 2023-02-01 MED ORDER — SPIRONOLACTONE 25 MG PO TABS: 25.0000 mg | ORAL_TABLET | Freq: Every day | ORAL | 1 refills | Status: DC

## 2023-02-01 MED ORDER — OLMESARTAN MEDOXOMIL 40 MG PO TABS: 40.0000 mg | ORAL_TABLET | Freq: Every day | ORAL | 1 refills | Status: DC

## 2023-02-01 MED ORDER — PANTOPRAZOLE SODIUM 40 MG PO TBEC: 40.0000 mg | DELAYED_RELEASE_TABLET | Freq: Every day | ORAL | 1 refills | Status: DC

## 2023-02-01 MED ORDER — HYDROCHLOROTHIAZIDE 25 MG PO TABS: 25.0000 mg | ORAL_TABLET | Freq: Every day | ORAL | 1 refills | Status: DC

## 2023-02-01 MED ORDER — LEVOTHYROXINE SODIUM 88 MCG PO TABS: ORAL_TABLET | ORAL | 1 refills | Status: DC

## 2023-02-01 NOTE — Progress Notes (Unsigned)
Subjective:    Patient ID: Mariah Moody, female    DOB: 07/19/1941, 82 y.o.   MRN: [id number]  Patient here for  Chief Complaint  Patient presents with   Medical Management of Chronic Issues    HPI Here to follow up regarding hypercholesterolemia and hypertension.  On omesartan, hctz and aldactone.  Saw Dr Erlene Quan 07/07/22 - evaluation - hydronephrosis. Recommended f/u renal ultrasound 6-8 weeks. F/u ultrasound - hydronephrosis resolved. Last visit, had issues with persistent acid reflux.  Placed on protonix 40mg  q am and 20mg  q pm.  This is controlling her acid reflux.  No chest pain or sob reported.  No abdominal pain or bowel change reported.  Some allergy symptoms.  Controlled with claritin.  She does report concerns regarding "sciatica".  Reports pain - starts in buttock and radiates down lateral side of leg.  Involves both legs.  Pain down side of leg.  No numbness or tingling.  No bladder or bowel change.  No weakness of her legs.  When applies pressure over lateral hip - aggravates.  Changing position helps.  No calf cramping with walking.    Past Medical History:  Diagnosis Date   Anemia    Coronary atherosclerosis of native coronary vessel    Diabetes mellitus without complication    Fibroid    GERD (gastroesophageal reflux disease)    History of chicken pox    Hyperlipidemia    Hypertension    Hypothyroidism    Past Surgical History:  Procedure Laterality Date   COLONOSCOPY N/A 04/15/2015   Procedure: COLONOSCOPY;  Surgeon: Hulen Luster, MD;  Location: [facility] ENDOSCOPY;  Service: Gastroenterology;  Laterality: N/A;   ESOPHAGOGASTRODUODENOSCOPY N/A 04/15/2015   Procedure: ESOPHAGOGASTRODUODENOSCOPY (EGD);  Surgeon: Hulen Luster, MD;  Location: [facility] ENDOSCOPY;  Service: Gastroenterology;  Laterality: N/A;   TUBAL LIGATION     Family History  Problem Relation Age of Onset   Arthritis Mother    Hypertension Mother    Colon polyps Mother    Heart disease Father     Hypertension Father    Diabetes Brother    Cancer Neg Hx    Breast cancer Neg Hx    Social History   Socioeconomic History   Marital status: Married    Spouse name: Not on file   Number of children: Not on file   Years of education: Not on file   Highest education level: Not on file  Occupational History   Not on file  Tobacco Use   Smoking status: Former   Smokeless tobacco: Never  Substance and Sexual Activity   Alcohol use: Not Currently    Comment: occas   Drug use: No   Sexual activity: Not Currently  Other Topics Concern   Not on file  Social History Narrative   Not on file   Social Determinants of Health   Financial Resource Strain: Not on file  Food Insecurity: Not on file  Transportation Needs: Not on file  Physical Activity: Not on file  Stress: Not on file  Social Connections: Not on file     Review of Systems  Constitutional:  Negative for appetite change and unexpected weight change.  HENT:  Negative for congestion and sinus pressure.   Respiratory:  Negative for cough, chest tightness and shortness of breath.   Cardiovascular:  Negative for chest pain and palpitations.  Gastrointestinal:  Negative for abdominal pain, diarrhea, nausea and vomiting.  Genitourinary:  Negative for difficulty urinating and dysuria.  Musculoskeletal:  Negative for joint swelling and myalgias.       Buttock and leg pain as outlined.  (Bilateral)  Skin:  Negative for color change and rash.  Neurological:  Negative for dizziness and headaches.  Psychiatric/Behavioral:  Negative for agitation and dysphoric mood.        Objective:     BP 138/78   Pulse 70   Temp 98.2 F (36.8 C)   Resp 16   Ht 5\' 2"  (1.575 m)   Wt 141 lb (64 kg)   SpO2 98%   BMI 25.79 kg/m  Wt Readings from Last 3 Encounters:  02/01/23 141 lb (64 kg)  11/24/22 140 lb (63.5 kg)  09/15/22 139 lb (63 kg)    Physical Exam Vitals reviewed.  Constitutional:      General: She is not in acute  distress.    Appearance: Normal appearance.  HENT:     Head: Normocephalic and atraumatic.     Right Ear: External ear normal.     Left Ear: External ear normal.  Eyes:     General: No scleral icterus.       Right eye: No discharge.        Left eye: No discharge.     Conjunctiva/sclera: Conjunctivae normal.  Neck:     Thyroid: No thyromegaly.  Cardiovascular:     Rate and Rhythm: Normal rate and regular rhythm.  Pulmonary:     Effort: No respiratory distress.     Breath sounds: Normal breath sounds. No wheezing.  Abdominal:     General: Bowel sounds are normal.     Palpations: Abdomen is soft.     Tenderness: There is no abdominal tenderness.  Musculoskeletal:        General: No swelling or tenderness.     Cervical back: Neck supple. No tenderness.     Comments: Negative SLR.  No pain with abduction/adduction lower extremities.  Motor strength equal bilateral lower extremities.  Point tenderness - lateral hips.    Lymphadenopathy:     Cervical: No cervical adenopathy.  Skin:    Findings: No erythema or rash.  Neurological:     Mental Status: She is alert.  Psychiatric:        Mood and Affect: Mood normal.        Behavior: Behavior normal.      Outpatient Encounter Medications as of 02/01/2023  Medication Sig   loratadine (CLARITIN) 10 MG tablet Take 1 tablet (10 mg total) by mouth daily as needed for allergies.   methylPREDNISolone (MEDROL DOSEPAK) 4 MG TBPK tablet Medrol dosepak - 6 day taper.  Take as directed.   Azelastine HCl 137 MCG/SPRAY SOLN PLACE 1 SPRAY INTO BOTH NOSTRILS 2 (TWO) TIMES DAILY. USE IN EACH NOSTRIL AS DIRECTED   estradiol-norethindrone (COMBIPATCH) 0.05-0.25 MG/DAY Place 1 patch onto the skin 2 (two) times a week.   hydrochlorothiazide (HYDRODIURIL) 25 MG tablet Take 1 tablet (25 mg total) by mouth daily.   levothyroxine (SYNTHROID) 88 MCG tablet TAKE 1 TABLET (88 MCG TOTAL) DAILY BY MOUTH.   Multiple Vitamins-Minerals (MULTIVITAMIN PO) Take 1  tablet by mouth daily.    olmesartan (BENICAR) 40 MG tablet Take 1 tablet (40 mg total) by mouth daily.   pantoprazole (PROTONIX) 20 MG tablet Take 1 tablet (20 mg total) by mouth daily. Take in the evening.   pantoprazole (PROTONIX) 40 MG tablet Take 1 tablet (40 mg total) by mouth daily.   spironolactone (ALDACTONE) 25 MG tablet Take 1  tablet (25 mg total) by mouth daily.   [DISCONTINUED] hydrochlorothiazide (HYDRODIURIL) 25 MG tablet TAKE 1 TABLET BY MOUTH EVERY DAY   [DISCONTINUED] levothyroxine (SYNTHROID) 88 MCG tablet TAKE 1 TABLET (88 MCG TOTAL) DAILY BY MOUTH.   [DISCONTINUED] olmesartan (BENICAR) 40 MG tablet TAKE 1 TABLET BY MOUTH EVERY DAY   [DISCONTINUED] pantoprazole (PROTONIX) 20 MG tablet Take 1 tablet (20 mg total) by mouth daily. Take in the evening.   [DISCONTINUED] pantoprazole (PROTONIX) 40 MG tablet Take 1 tablet (40 mg total) by mouth daily.   [DISCONTINUED] spironolactone (ALDACTONE) 25 MG tablet TAKE 1 TABLET (25 MG TOTAL) BY MOUTH DAILY.   No facility-administered encounter medications on file as of 02/01/2023.     Lab Results  Component Value Date   WBC 5.8 04/28/2022   HGB 12.2 04/28/2022   HCT 36.8 04/28/2022   PLT 255.0 04/28/2022   GLUCOSE 153 (H) 12/22/2022   CHOL 182 12/22/2022   TRIG 225.0 (H) 12/22/2022   HDL 30.20 (L) 12/22/2022   LDLDIRECT 104.0 12/22/2022   LDLCALC 139 (H) 12/02/2020   ALT 13 12/22/2022   AST 16 12/22/2022   NA 140 12/22/2022   K 3.9 12/22/2022   CL 104 12/22/2022   CREATININE 1.18 12/22/2022   BUN 28 (H) 12/22/2022   CO2 27 12/22/2022   TSH 5.32 12/22/2022   HGBA1C 6.8 (H) 12/22/2022   MICROALBUR <0.7 04/28/2022    Ultrasound renal complete  Result Date: 09/14/2022 CLINICAL DATA:  History of hydronephrosis. EXAM: RENAL / URINARY TRACT ULTRASOUND COMPLETE COMPARISON:  Renal ultrasound June 15, 2022. CT scan May 23, 2020. FINDINGS: Right Kidney: Renal measurements: 8.5 x 4.2 x 5.3 cm = volume: 100 mL. Contains an 8 mm  cyst. No follow-up imaging recommended for the cyst. No hydronephrosis identified. Left Kidney: Renal measurements: 10.2 x 4.7 x 5.5 cm = volume: 136 mL. Contains multiple simple cortical and parapelvic cysts with the largest measuring 6.2 cm. No follow-up imaging recommended for the cysts. Bladder: The bladder is not well evaluated due to lack of distention. No obvious abnormalities in the region the bladder. Other: None. IMPRESSION: 1. The kidneys are unremarkable. No hydronephrosis identified. 2. The bladder is not well evaluated due to lack of distention. No obvious abnormalities in the region the bladder. Electronically Signed   By: Dorise Bullion III M.D.   On: 09/14/2022 14:49       Assessment & Plan:  Type 2 diabetes mellitus with hyperglycemia, without long-term current use of insulin -     Hemoglobin A1c; Future  Hypercholesterolemia -     Hepatic function panel; Future -     Lipid panel; Future  Essential hypertension, benign -     CBC with Differential/Platelet; Future -     Basic metabolic panel; Future -     Basic metabolic panel  Other orders -     hydroCHLOROthiazide; Take 1 tablet (25 mg total) by mouth daily.  Dispense: 90 tablet; Refill: 1 -     Levothyroxine Sodium; TAKE 1 TABLET (88 MCG TOTAL) DAILY BY MOUTH.  Dispense: 90 tablet; Refill: 1 -     Olmesartan Medoxomil; Take 1 tablet (40 mg total) by mouth daily.  Dispense: 90 tablet; Refill: 1 -     Pantoprazole Sodium; Take 1 tablet (20 mg total) by mouth daily. Take in the evening.  Dispense: 90 tablet; Refill: 1 -     Pantoprazole Sodium; Take 1 tablet (40 mg total) by mouth daily.  Dispense: 90 tablet;  Refill: 1 -     Spironolactone; Take 1 tablet (25 mg total) by mouth daily.  Dispense: 90 tablet; Refill: 1 -     methylPREDNISolone; Medrol dosepak - 6 day taper.  Take as directed.  Dispense: 21 tablet; Refill: 0 -     Loratadine; Take 1 tablet (10 mg total) by mouth daily as needed for allergies.  Dispense: 30  tablet; Refill: 1     Einar Pheasant, MD

## 2023-02-01 NOTE — Patient Instructions (Signed)
Your Medicare Annual Wellness Visit is due. Please schedule this at check out. 

## 2023-02-02 ENCOUNTER — Encounter: Payer: Self-pay | Admitting: Internal Medicine

## 2023-02-02 ENCOUNTER — Other Ambulatory Visit: Payer: Self-pay | Admitting: Internal Medicine

## 2023-02-02 DIAGNOSIS — M5430 Sciatica, unspecified side: Secondary | ICD-10-CM | POA: Insufficient documentation

## 2023-02-02 MED ORDER — COMBIPATCH 0.05-0.25 MG/DAY TD PTTW: 1.0000 | MEDICATED_PATCH | TRANSDERMAL | 1 refills | Status: DC

## 2023-02-02 NOTE — Assessment & Plan Note (Signed)
Has declined cholesterol medication.  Continue low cholesterol diet and exercise.  Follow lipid panel.  

## 2023-02-02 NOTE — Assessment & Plan Note (Signed)
Describes pain as outlined.  Persistent.  No muscle pain or cramping with ambulation.  Feels similar to previous "flare".  Have her stop antiinflammatories.  Medrol dosepak as directed.  Discussed possible side effects.  Follow.  Call with update.

## 2023-02-02 NOTE — Assessment & Plan Note (Signed)
MRI 06/15/21 - unchanged.  Recommended f/u in 12 months.  Overdue. Have discussed.  Had elected to hol on f/u MRI.

## 2023-02-02 NOTE — Assessment & Plan Note (Addendum)
She watches what she eats.  Low carb diet and exercise.  Stays active.  Follow metabolic panel and [id number].

## 2023-02-02 NOTE — Assessment & Plan Note (Signed)
Dr Evans (02/03/22) - discussed endometrial biopsy.  See note.  States decided to hold. Continue HRT.  

## 2023-02-02 NOTE — Assessment & Plan Note (Signed)
Dr Schnier - 08/17/22: ABI's Rt=0.86 and Lt=0.87 (TBI's are also improved.  There are biphasic Doppler signals bilaterally) (previous ABI's Rt=0.74 and Lt=0.74(There are biphasic Doppler signals bilaterally))   Previous duplex ultrasound of the aortailiac system shows patent aorta iliac without hemodynamically stenosis. 

## 2023-02-02 NOTE — Telephone Encounter (Signed)
Is there an alternative to this? Her insurance is not covering.

## 2023-02-02 NOTE — Assessment & Plan Note (Signed)
Dr Schnier 08/17/22 - Duplex ultrasound shows 1-39% stenosis bilaterally. F/u 5 years.  

## 2023-02-02 NOTE — Assessment & Plan Note (Signed)
On thyroid replacement.  Follow tsh.  

## 2023-02-02 NOTE — Assessment & Plan Note (Signed)
09/2022 - Hydronephrosisresolved, suspect this was likely iatrogenic in the setting of massively distended bladder on her initial study; resolved with adequate bladder emptying  

## 2023-02-02 NOTE — Assessment & Plan Note (Signed)
Colonoscopy 04/2015 - entire colon normal.  

## 2023-02-02 NOTE — Assessment & Plan Note (Addendum)
Avoid antiinflammatories.  Stay hydrated.  Follow metabolic panel.  She has been taking ibuprofen for her "sciatica".  Check metabolic panel today.

## 2023-02-02 NOTE — Assessment & Plan Note (Signed)
Taking protonix 40mg  q am and 20mg  q pm.  No acid reflux. Follow.

## 2023-02-02 NOTE — Assessment & Plan Note (Signed)
Blood pressure as outlined.  Continue olmesartan, hctz. Had problems with amlodipine.  Intolerance to hydralazine.  On spironolactone 25mg  q day.  Blood pressure improved.  Follow pressures.  Follow metabolic panel.

## 2023-02-03 NOTE — Telephone Encounter (Signed)
Patient says she does not get this from [facility] and her insurance does not pay. She orders these herself from [place].

## 2023-02-03 NOTE — Telephone Encounter (Signed)
See note

## 2023-02-03 NOTE — Telephone Encounter (Signed)
Not that I am aware of.  Can see if pharmacy has alternatives.

## 2023-02-04 ENCOUNTER — Other Ambulatory Visit: Payer: Self-pay | Admitting: Internal Medicine

## 2023-02-04 DIAGNOSIS — R932 Abnormal findings on diagnostic imaging of liver and biliary tract: Secondary | ICD-10-CM

## 2023-02-04 NOTE — Progress Notes (Signed)
Order placed for MRI abdomen.  

## 2023-02-05 ENCOUNTER — Telehealth: Payer: Self-pay | Admitting: Internal Medicine

## 2023-02-05 NOTE — Telephone Encounter (Signed)
Lft pt vm to call ofc to sch MRI. thanks 

## 2023-02-14 ENCOUNTER — Ambulatory Visit
Admission: RE | Admit: 2023-02-14 | Discharge: 2023-02-14 | Disposition: A | Discharge status: Home / Self Care | Payer: PPO | Source: Ambulatory Visit | Attending: Internal Medicine | Admitting: Internal Medicine

## 2023-02-14 ENCOUNTER — Other Ambulatory Visit: Payer: Self-pay | Admitting: Internal Medicine

## 2023-02-14 DIAGNOSIS — R932 Abnormal findings on diagnostic imaging of liver and biliary tract: Secondary | ICD-10-CM | POA: Diagnosis not present

## 2023-02-14 DIAGNOSIS — N281 Cyst of kidney, acquired: Secondary | ICD-10-CM | POA: Diagnosis not present

## 2023-02-14 DIAGNOSIS — K7689 Other specified diseases of liver: Secondary | ICD-10-CM | POA: Diagnosis not present

## 2023-02-14 MED ORDER — GADOBUTROL 1 MMOL/ML IV SOLN
6.0000 mL | Freq: Once | INTRAVENOUS | Status: AC | PRN
  Administered 2023-02-14: 6 mL via INTRAVENOUS

## 2023-02-17 ENCOUNTER — Telehealth: Payer: Self-pay

## 2023-02-17 NOTE — Telephone Encounter (Signed)
-----   Message from Dale Toftrees, MD sent at 02/17/2023  3:57 AM EDT ----- Please call and notify - MRI ok.  Specifically - liver lesion - stable.  (Per radiology - requires no further imaging or f/u).  No acute abnormal findings.

## 2023-02-17 NOTE — Telephone Encounter (Signed)
LMTCB. OK to give results  

## 2023-02-19 NOTE — Telephone Encounter (Signed)
Pt returned [place] LPN call. Note below was read to her. Pt aware and understood.

## 2023-02-23 ENCOUNTER — Other Ambulatory Visit: Payer: Self-pay | Admitting: Internal Medicine

## 2023-04-06 ENCOUNTER — Other Ambulatory Visit (INDEPENDENT_AMBULATORY_CARE_PROVIDER_SITE_OTHER): Payer: PPO

## 2023-04-06 ENCOUNTER — Other Ambulatory Visit: Payer: Self-pay | Admitting: Internal Medicine

## 2023-04-06 ENCOUNTER — Ambulatory Visit (INDEPENDENT_AMBULATORY_CARE_PROVIDER_SITE_OTHER): Payer: PPO

## 2023-04-06 DIAGNOSIS — E7800 Pure hypercholesterolemia, unspecified: Secondary | ICD-10-CM

## 2023-04-06 DIAGNOSIS — E1165 Type 2 diabetes mellitus with hyperglycemia: Secondary | ICD-10-CM | POA: Diagnosis not present

## 2023-04-06 DIAGNOSIS — D649 Anemia, unspecified: Secondary | ICD-10-CM

## 2023-04-06 DIAGNOSIS — I10 Essential (primary) hypertension: Secondary | ICD-10-CM

## 2023-04-06 LAB — LDL CHOLESTEROL, DIRECT: Direct LDL: 99 mg/dL

## 2023-04-06 LAB — LIPID PANEL
Cholesterol: 198 mg/dL (ref 0–200)
HDL: 30.8 mg/dL — ABNORMAL LOW (ref >39.00)
NonHDL: 166.85
Total CHOL/HDL Ratio: 6
Triglycerides: 318 mg/dL — ABNORMAL HIGH (ref 0.0–149.0)
VLDL: 63.6 mg/dL — ABNORMAL HIGH (ref 0.0–40.0)

## 2023-04-06 LAB — CBC WITH DIFFERENTIAL/PLATELET
Basophils Absolute: 0 10*3/uL (ref 0.0–0.1)
Basophils Relative: 0.6 % (ref 0.0–3.0)
Eosinophils Absolute: 0.2 10*3/uL (ref 0.0–0.7)
Eosinophils Relative: 2.5 % (ref 0.0–5.0)
HCT: 34.2 % — ABNORMAL LOW (ref 36.0–46.0)
Hemoglobin: 11.7 g/dL — ABNORMAL LOW (ref 12.0–15.0)
Lymphocytes Relative: 26.6 % (ref 12.0–46.0)
Lymphs Abs: 1.6 10*3/uL (ref 0.7–4.0)
MCHC: 34.2 g/dL (ref 30.0–36.0)
MCV: 90.5 fl (ref 78.0–100.0)
Monocytes Absolute: 0.5 10*3/uL (ref 0.1–1.0)
Monocytes Relative: 8.5 % (ref 3.0–12.0)
Neutro Abs: 3.7 10*3/uL (ref 1.4–7.7)
Neutrophils Relative %: 61.8 % (ref 43.0–77.0)
Platelets: 266 10*3/uL (ref 150.0–400.0)
RBC: 3.78 Mil/uL — ABNORMAL LOW (ref 3.87–5.11)
RDW: 13.1 % (ref 11.5–15.5)
WBC: 6 10*3/uL (ref 4.0–10.5)

## 2023-04-06 LAB — BASIC METABOLIC PANEL
BUN: 28 mg/dL — ABNORMAL HIGH (ref 6–23)
CO2: 27 mEq/L (ref 19–32)
Calcium: 9.3 mg/dL (ref 8.4–10.5)
Chloride: 105 mEq/L (ref 96–112)
Creatinine, Ser: 1.21 mg/dL — ABNORMAL HIGH (ref 0.40–1.20)
GFR: 42.46 mL/min — ABNORMAL LOW (ref >60.00)
Glucose, Bld: 158 mg/dL — ABNORMAL HIGH (ref 70–99)
Potassium: 4 mEq/L (ref 3.5–5.1)
Sodium: 140 mEq/L (ref 135–145)

## 2023-04-06 LAB — VITAMIN B12: Vitamin B-12: 343 pg/mL (ref 211–911)

## 2023-04-06 LAB — HEPATIC FUNCTION PANEL
ALT: 10 U/L (ref 0–35)
AST: 16 U/L (ref 0–37)
Albumin: 4.1 g/dL (ref 3.5–5.2)
Alkaline Phosphatase: 81 U/L (ref 39–117)
Bilirubin, Direct: 0.1 mg/dL (ref 0.0–0.3)
Total Bilirubin: 0.8 mg/dL (ref 0.2–1.2)
Total Protein: 6.6 g/dL (ref 6.0–8.3)

## 2023-04-06 LAB — IBC + FERRITIN
Ferritin: 17.7 ng/mL (ref 10.0–291.0)
Iron: 129 ug/dL (ref 42–145)
Saturation Ratios: 38.7 % (ref 20.0–50.0)
TIBC: 333.2 ug/dL (ref 250.0–450.0)
Transferrin: 238 mg/dL (ref 212.0–360.0)

## 2023-04-06 LAB — HEMOGLOBIN A1C: Hgb A1c MFr Bld: 7 % — ABNORMAL HIGH (ref 4.6–6.5)

## 2023-04-06 NOTE — Progress Notes (Signed)
Order placed for add on labs.  (iron studies and B12).  

## 2023-04-07 ENCOUNTER — Telehealth: Payer: Self-pay

## 2023-04-07 NOTE — Telephone Encounter (Signed)
-----   Message from Dale Corozal, MD sent at 04/07/2023  5:43 AM EDT ----- Notify - triglycerides remain elevated.  A1c increased from last check - 7.0.  recommend continuing a low carb diet.  Has declined cholesterol medication previously.  Notify me if changes her mind. Will also discuss with her more during her upcoming appt. Hgb slightly decreased.  B12 is in the normal range, but I would like for it to be a little higher.  If not taking any B12, I would like for her to start oral b12 q day. Will follow hgb.  Will schedule f/u cbc at her appt. Kidney function relatively stable overall.  Decreased some from last check.  Avoid antiinflammatories. Liver panel wnl.

## 2023-04-07 NOTE — Telephone Encounter (Signed)
Patient returned call from Rita Ohara, LPN.  I read Dr. Westley Hummer Scott's message to patient.

## 2023-04-15 DIAGNOSIS — I10 Essential (primary) hypertension: Secondary | ICD-10-CM | POA: Diagnosis not present

## 2023-04-15 DIAGNOSIS — J0140 Acute pansinusitis, unspecified: Secondary | ICD-10-CM | POA: Diagnosis not present

## 2023-04-15 DIAGNOSIS — Z6825 Body mass index (BMI) 25.0-25.9, adult: Secondary | ICD-10-CM | POA: Diagnosis not present

## 2023-04-22 ENCOUNTER — Telehealth: Payer: Self-pay

## 2023-04-22 MED ORDER — COMBIPATCH 0.05-0.25 MG/DAY TD PTTW: 1.0000 | MEDICATED_PATCH | TRANSDERMAL | 1 refills | Status: DC

## 2023-04-22 NOTE — Telephone Encounter (Signed)
Rx printed for you to sign,.

## 2023-04-22 NOTE — Telephone Encounter (Signed)
Placed up front for pick up. Patient is aware 

## 2023-04-22 NOTE — Telephone Encounter (Signed)
Prescription Request  04/22/2023  LOV: Visit date not found  What is the name of the medication or equipment?  estradiol-norethindrone [facility]) 0.05-0.25 MG/DAY   Have you contacted your pharmacy to request a refill? No   Which pharmacy would you like this sent to?  Patient states she needs the written prescription for this patch.  Patient states she sends it to [place] for a refill.    Patient notified that their request is being sent to the clinical staff for review and that they should receive a response within 2 business days.   Please advise at Mobile [phone] (mobile)  Patient states she has one or two left, but it takes a while for her to get these from [place].  Patient states she would like to pick it up sometime this week so she can go ahead and fax it.  Patient would like for [place] to call her when it's ready to be picked up.

## 2023-04-22 NOTE — Telephone Encounter (Signed)
Signed and placed in box.   

## 2023-05-04 ENCOUNTER — Ambulatory Visit (INDEPENDENT_AMBULATORY_CARE_PROVIDER_SITE_OTHER): Payer: PPO | Admitting: Internal Medicine

## 2023-05-04 ENCOUNTER — Encounter: Payer: Self-pay | Admitting: Internal Medicine

## 2023-05-04 VITALS — BP 138/82 | HR 74 | Temp 97.9°F | Resp 16 | Ht 62.0 in | Wt 138.4 lb

## 2023-05-04 DIAGNOSIS — I739 Peripheral vascular disease, unspecified: Secondary | ICD-10-CM | POA: Diagnosis not present

## 2023-05-04 DIAGNOSIS — K219 Gastro-esophageal reflux disease without esophagitis: Secondary | ICD-10-CM | POA: Diagnosis not present

## 2023-05-04 DIAGNOSIS — N1832 Chronic kidney disease, stage 3b: Secondary | ICD-10-CM | POA: Diagnosis not present

## 2023-05-04 DIAGNOSIS — I779 Disorder of arteries and arterioles, unspecified: Secondary | ICD-10-CM

## 2023-05-04 DIAGNOSIS — E1165 Type 2 diabetes mellitus with hyperglycemia: Secondary | ICD-10-CM

## 2023-05-04 DIAGNOSIS — E7800 Pure hypercholesterolemia, unspecified: Secondary | ICD-10-CM

## 2023-05-04 DIAGNOSIS — Z0000 Encounter for general adult medical examination without abnormal findings: Secondary | ICD-10-CM

## 2023-05-04 DIAGNOSIS — I10 Essential (primary) hypertension: Secondary | ICD-10-CM | POA: Diagnosis not present

## 2023-05-04 DIAGNOSIS — Z1231 Encounter for screening mammogram for malignant neoplasm of breast: Secondary | ICD-10-CM

## 2023-05-04 DIAGNOSIS — D649 Anemia, unspecified: Secondary | ICD-10-CM

## 2023-05-04 DIAGNOSIS — E039 Hypothyroidism, unspecified: Secondary | ICD-10-CM

## 2023-05-04 MED ORDER — HYDROCHLOROTHIAZIDE 12.5 MG PO CAPS: 12.5000 mg | ORAL_CAPSULE | Freq: Every day | ORAL | 1 refills | Status: DC

## 2023-05-04 NOTE — Assessment & Plan Note (Signed)
Physical today 05/04/23.  Mammogram 08/03/22 - Birads I.  Colonoscopy 2016 - per report - no f/u warranted.

## 2023-05-04 NOTE — Progress Notes (Signed)
Subjective:    Patient ID: Mariah Moody, female    DOB: [birth year], 82 y.o.   MRN: [id number]  Patient here for  Chief Complaint  Patient presents with   Annual Exam    HPI Here for a physical exam.  On olmesartan, hctz and aldactone. Has noticed recently - blood pressure lower.  Decreased energy.  Was questioning if medication needed to be adjusted.  No chest pain.  Breathing stable.   Saw Dr Apolinar Junes 07/07/22 - evaluation - hydronephrosis. Recommended f/u renal ultrasound 6-8 weeks. F/u ultrasound - hydronephrosis resolved.  On protonix (40 am and 20 pm) - controlling acid reflux.  Was recently seen 04/15/23 - sinus infection.  Treated.  She is feeling better.  Some residual minimal cough and drainage.  No abdominal pain. Previous noted thickened endometrium - on pelvic ultrasound.  Saw gyn 02/03/22.  Note reviewed - wanted to hold on endometrial biopsy.  Discussed labs.  A1c 7.0.  discussed diet and exercise and medication options.     Past Medical History:  Diagnosis Date   Anemia    Coronary atherosclerosis of native coronary vessel    Diabetes mellitus without complication (HCC)    Fibroid    GERD (gastroesophageal reflux disease)    History of chicken pox    Hyperlipidemia    Hypertension    Hypothyroidism    Past Surgical History:  Procedure Laterality Date   COLONOSCOPY N/A 04/15/2015   Procedure: COLONOSCOPY;  Surgeon: Wallace Cullens, MD;  Location: [facility] ENDOSCOPY;  Service: Gastroenterology;  Laterality: N/A;   ESOPHAGOGASTRODUODENOSCOPY N/A 04/15/2015   Procedure: ESOPHAGOGASTRODUODENOSCOPY (EGD);  Surgeon: Wallace Cullens, MD;  Location: [facility] ENDOSCOPY;  Service: Gastroenterology;  Laterality: N/A;   TUBAL LIGATION     Family History  Problem Relation Age of Onset   Arthritis Mother    Hypertension Mother    Colon polyps Mother    Heart disease Father    Hypertension Father    Diabetes Brother    Cancer Neg Hx    Breast cancer Neg Hx    Social History    Socioeconomic History   Marital status: Married    Spouse name: Not on file   Number of children: Not on file   Years of education: Not on file   Highest education level: Not on file  Occupational History   Not on file  Tobacco Use   Smoking status: Former   Smokeless tobacco: Never  Substance and Sexual Activity   Alcohol use: Not Currently    Comment: occas   Drug use: No   Sexual activity: Not Currently  Other Topics Concern   Not on file  Social History Narrative   Not on file   Social Determinants of Health   Financial Resource Strain: Not on file  Food Insecurity: Not on file  Transportation Needs: Not on file  Physical Activity: Not on file  Stress: Not on file  Social Connections: Not on file     Review of Systems  Constitutional:  Negative for appetite change and unexpected weight change.  HENT:  Positive for congestion and postnasal drip. Negative for sinus pressure and sore throat.   Eyes:  Negative for pain and visual disturbance.  Respiratory:  Positive for cough. Negative for chest tightness and shortness of breath.   Cardiovascular:  Negative for chest pain, palpitations and leg swelling.  Gastrointestinal:  Negative for abdominal pain, diarrhea, nausea and vomiting.  Genitourinary:  Negative for difficulty urinating and dysuria.  Musculoskeletal:  Negative for joint swelling and myalgias.  Skin:  Negative for color change and rash.  Neurological:  Negative for dizziness and headaches.  Hematological:  Negative for adenopathy. Does not bruise/bleed easily.  Psychiatric/Behavioral:  Negative for agitation and dysphoric mood.        Objective:     BP 138/82   Pulse 74   Temp 97.9 F (36.6 C)   Resp 16   Ht 5\' 2"  (1.575 m)   Wt 138 lb 6.4 oz (62.8 kg)   SpO2 97%   BMI 25.31 kg/m  Wt Readings from Last 3 Encounters:  05/04/23 138 lb 6.4 oz (62.8 kg)  02/01/23 141 lb (64 kg)  11/24/22 140 lb (63.5 kg)    Physical Exam Vitals reviewed.   Constitutional:      General: She is not in acute distress.    Appearance: Normal appearance. She is well-developed.  HENT:     Head: Normocephalic and atraumatic.     Right Ear: External ear normal.     Left Ear: External ear normal.  Eyes:     General: No scleral icterus.       Right eye: No discharge.        Left eye: No discharge.     Conjunctiva/sclera: Conjunctivae normal.  Neck:     Thyroid: No thyromegaly.  Cardiovascular:     Rate and Rhythm: Normal rate and regular rhythm.  Pulmonary:     Effort: No tachypnea, accessory muscle usage or respiratory distress.     Breath sounds: Normal breath sounds. No decreased breath sounds or wheezing.  Chest:  Breasts:    Right: No inverted nipple, mass, nipple discharge or tenderness (no axillary adenopathy).     Left: No inverted nipple, mass, nipple discharge or tenderness (no axilarry adenopathy).  Abdominal:     General: Bowel sounds are normal.     Palpations: Abdomen is soft.     Tenderness: There is no abdominal tenderness.  Musculoskeletal:        General: No swelling or tenderness.     Cervical back: Neck supple.  Lymphadenopathy:     Cervical: No cervical adenopathy.  Skin:    Findings: No erythema or rash.  Neurological:     Mental Status: She is alert and oriented to person, place, and time.  Psychiatric:        Mood and Affect: Mood normal.        Behavior: Behavior normal.      Outpatient Encounter Medications as of 05/04/2023  Medication Sig   cyanocobalamin 1000 MCG tablet Take 1,000 mcg by mouth daily.   hydrochlorothiazide (MICROZIDE) 12.5 MG capsule Take 1 capsule (12.5 mg total) by mouth daily.   Azelastine HCl 137 MCG/SPRAY SOLN PLACE 1 SPRAY INTO BOTH NOSTRILS 2 (TWO) TIMES DAILY. USE IN EACH NOSTRIL AS DIRECTED   estradiol-norethindrone (COMBIPATCH) 0.05-0.25 MG/DAY Place 1 patch onto the skin 2 (two) times a week.   levothyroxine (SYNTHROID) 88 MCG tablet TAKE 1 TABLET (88 MCG TOTAL) DAILY BY  MOUTH.   loratadine (CLARITIN) 10 MG tablet TAKE 1 TABLET BY MOUTH EVERY DAY AS NEEDED FOR ALLERGY   Multiple Vitamins-Minerals (MULTIVITAMIN PO) Take 1 tablet by mouth daily.    olmesartan (BENICAR) 40 MG tablet Take 1 tablet (40 mg total) by mouth daily.   pantoprazole (PROTONIX) 20 MG tablet Take 1 tablet (20 mg total) by mouth daily. Take in the evening.   pantoprazole (PROTONIX) 40 MG tablet Take 1 tablet (40  mg total) by mouth daily.   spironolactone (ALDACTONE) 25 MG tablet Take 1 tablet (25 mg total) by mouth daily.   [DISCONTINUED] hydrochlorothiazide (HYDRODIURIL) 25 MG tablet Take 1 tablet (25 mg total) by mouth daily.   [DISCONTINUED] methylPREDNISolone (MEDROL DOSEPAK) 4 MG TBPK tablet Medrol dosepak - 6 day taper.  Take as directed.   No facility-administered encounter medications on file as of 05/04/2023.     Lab Results  Component Value Date   WBC 6.0 04/06/2023   HGB 11.7 (L) 04/06/2023   HCT 34.2 (L) 04/06/2023   PLT 266.0 04/06/2023   GLUCOSE 158 (H) 04/06/2023   CHOL 198 04/06/2023   TRIG 318.0 (H) 04/06/2023   HDL 30.80 (L) 04/06/2023   LDLDIRECT 99.0 04/06/2023   LDLCALC 139 (H) 12/02/2020   ALT 10 04/06/2023   AST 16 04/06/2023   NA 140 04/06/2023   K 4.0 04/06/2023   CL 105 04/06/2023   CREATININE 1.21 (H) 04/06/2023   BUN 28 (H) 04/06/2023   CO2 27 04/06/2023   TSH 5.32 12/22/2022   HGBA1C 7.0 (H) 04/06/2023   MICROALBUR <0.7 04/28/2022    MR ABDOMEN WWO CONTRAST  Result Date: 02/16/2023 CLINICAL DATA:  Follow-up liver lesion. EXAM: MRI ABDOMEN WITHOUT AND WITH CONTRAST TECHNIQUE: Multiplanar multisequence MR imaging of the abdomen was performed both before and after the administration of intravenous contrast. CONTRAST:  6mL GADAVIST GADOBUTROL 1 MMOL/ML IV SOLN COMPARISON:  Prior MRI abdomen 06/15/2021 FINDINGS: Lower chest: The lung bases are clear. No pulmonary lesions, pleural or pericardial effusion. Hepatobiliary: Stable segment 2 liver lesion most  likely a vascular malformation or possibly an atypical hemangioma. It appears to be a tangle of vessels which maintains vascular poor signal intensity on the delayed images. No significant change since 2021. This is considered benign and does not need any further imaging evaluation or follow-up. Stable small right hepatic lobe cyst. The gallbladder is unremarkable. No common bile duct dilatation. Pancreas:  No mass, inflammation or ductal dilatation. Spleen:  Normal size.  No focal lesions. Adrenals/Urinary Tract:  The adrenal glands are normal. Stable simple nonenhancing renal cysts. No further imaging evaluation or follow-up is necessary. Stomach/Bowel: The stomach, duodenum, visualized small bowel and visualized colon are unremarkable. Vascular/Lymphatic: Stable atherosclerotic changes involving the aorta and proximal iliac arteries but no aneurysm or dissection. The major venous structures are patent. No mesenteric or retroperitoneal mass or adenopathy. Other:  No ascites or abdominal wall hernia. Musculoskeletal: No significant bony findings. IMPRESSION: 1. Stable segment 2 liver lesion most likely a vascular malformation or possibly an atypical hemangioma. This is considered benign and does not need any further imaging evaluation or follow-up. 2. Stable small right hepatic lobe cyst. 3. Stable simple nonenhancing renal cysts. No further imaging evaluation or follow-up is necessary. 4. No acute abdominal findings, mass lesions or adenopathy. Electronically Signed   By: Rudie Meyer M.D.   On: 02/16/2023 12:07       Assessment & Plan:  Health care maintenance Assessment & Plan: Physical today 05/04/23.  Mammogram 08/03/22 - Birads I.  Colonoscopy 2016 - per report - no f/u warranted.    Visit for screening mammogram -     3D Screening Mammogram, Left and Right; Future  Anemia, unspecified type -     CBC with Differential/Platelet; Future  Essential hypertension, benign Assessment & Plan: Blood  pressure as outlined.  Continue olmesartan. Had problems with amlodipine.  Intolerance to hydralazine.  On spironolactone 25mg  q day.  Given blood  pressure lower and feeling decreased energy, will decrease hydrochlorothiazide to 12.5mg  q day.  Follow pressures.  Follow metabolic panel. Assess for symptom improvement.   Orders: -     Basic metabolic panel; Future  Carotid artery disease, unspecified laterality, unspecified type [facility]) Assessment & Plan: Dr Gilda Crease 08/17/22 - Duplex ultrasound shows 1-39% stenosis bilaterally. F/u 5 years.    Stage 3b chronic kidney disease (HCC) Assessment & Plan: Avoid antiinflammatories.  Stay hydrated.  Follow metabolic panel.  Decrease hydrochlorothiazide to 12.5mg  q day.     Gastroesophageal reflux disease, unspecified whether esophagitis present Assessment & Plan: Taking protonix 40mg  q am and 20mg  q pm. Controlling acid reflux.  Follow.    Hypercholesterolemia Assessment & Plan: Has declined cholesterol medication.  Continue low cholesterol diet and exercise.  Follow lipid panel.    Hypothyroidism, unspecified type Assessment & Plan: On thyroid replacement.  Follow tsh.    PAD (peripheral artery disease) [facility]) Assessment & Plan: Dr Gilda Crease - 08/17/22: ABI's Rt=0.86 and Lt=0.87 (TBI's are also improved.  There are biphasic Doppler signals bilaterally) (previous ABI's Rt=0.74 and Lt=0.74(There are biphasic Doppler signals bilaterally))   Previous duplex ultrasound of the aortailiac system shows patent aorta iliac without hemodynamically stenosis.   Type 2 diabetes mellitus with hyperglycemia, without long-term current use of insulin (HCC) Assessment & Plan: She watches what she eats.  Low carb diet and exercise. A1c just checked 7.0.   Stays active.  Follow metabolic panel and a1c. Wants to monitor.  Hold on additional medication.    Other orders -     hydroCHLOROthiazide; Take 1 capsule (12.5 mg total) by mouth daily.  Dispense: 90  capsule; Refill: 1     Dale Huron, MD

## 2023-05-09 ENCOUNTER — Encounter: Payer: Self-pay | Admitting: Internal Medicine

## 2023-05-09 NOTE — Assessment & Plan Note (Signed)
Dr Schnier 08/17/22 - Duplex ultrasound shows 1-39% stenosis bilaterally. F/u 5 years.  

## 2023-05-09 NOTE — Assessment & Plan Note (Signed)
Taking protonix 40mg  q am and 20mg  q pm. Controlling acid reflux.  Follow.

## 2023-05-09 NOTE — Assessment & Plan Note (Signed)
She watches what she eats.  Low carb diet and exercise. A1c just checked 7.0.   Stays active.  Follow metabolic panel and a1c. Wants to monitor.  Hold on additional medication.

## 2023-05-09 NOTE — Assessment & Plan Note (Signed)
Has declined cholesterol medication.  Continue low cholesterol diet and exercise.  Follow lipid panel.  

## 2023-05-09 NOTE — Assessment & Plan Note (Signed)
On thyroid replacement.  Follow tsh.  

## 2023-05-09 NOTE — Assessment & Plan Note (Signed)
Avoid antiinflammatories.  Stay hydrated.  Follow metabolic panel.  Decrease hydrochlorothiazide to 12.5mg  q day.

## 2023-05-09 NOTE — Assessment & Plan Note (Signed)
Blood pressure as outlined.  Continue olmesartan. Had problems with amlodipine.  Intolerance to hydralazine.  On spironolactone 25mg  q day.  Given blood pressure lower and feeling decreased energy, will decrease hydrochlorothiazide to 12.5mg  q day.  Follow pressures.  Follow metabolic panel. Assess for symptom improvement.

## 2023-05-09 NOTE — Assessment & Plan Note (Signed)
Dr Schnier - 08/17/22: ABI's Rt=0.86 and Lt=0.87 (TBI's are also improved.  There are biphasic Doppler signals bilaterally) (previous ABI's Rt=0.74 and Lt=0.74(There are biphasic Doppler signals bilaterally))   Previous duplex ultrasound of the aortailiac system shows patent aorta iliac without hemodynamically stenosis. 

## 2023-05-14 DIAGNOSIS — H2513 Age-related nuclear cataract, bilateral: Secondary | ICD-10-CM | POA: Diagnosis not present

## 2023-05-14 DIAGNOSIS — H04123 Dry eye syndrome of bilateral lacrimal glands: Secondary | ICD-10-CM | POA: Diagnosis not present

## 2023-05-14 DIAGNOSIS — H538 Other visual disturbances: Secondary | ICD-10-CM | POA: Diagnosis not present

## 2023-05-14 DIAGNOSIS — E119 Type 2 diabetes mellitus without complications: Secondary | ICD-10-CM | POA: Diagnosis not present

## 2023-05-14 DIAGNOSIS — H348322 Tributary (branch) retinal vein occlusion, left eye, stable: Secondary | ICD-10-CM | POA: Diagnosis not present

## 2023-05-14 LAB — HM DIABETES EYE EXAM

## 2023-06-01 ENCOUNTER — Other Ambulatory Visit (INDEPENDENT_AMBULATORY_CARE_PROVIDER_SITE_OTHER): Payer: PPO

## 2023-06-01 DIAGNOSIS — D649 Anemia, unspecified: Secondary | ICD-10-CM | POA: Diagnosis not present

## 2023-06-01 DIAGNOSIS — I10 Essential (primary) hypertension: Secondary | ICD-10-CM | POA: Diagnosis not present

## 2023-06-01 LAB — BASIC METABOLIC PANEL
BUN: 32 mg/dL — ABNORMAL HIGH (ref 6–23)
CO2: 26 mEq/L (ref 19–32)
Calcium: 9.8 mg/dL (ref 8.4–10.5)
Chloride: 104 mEq/L (ref 96–112)
Creatinine, Ser: 1.24 mg/dL — ABNORMAL HIGH (ref 0.40–1.20)
GFR: 41.19 mL/min — ABNORMAL LOW (ref >60.00)
Glucose, Bld: 160 mg/dL — ABNORMAL HIGH (ref 70–99)
Potassium: 4.4 mEq/L (ref 3.5–5.1)
Sodium: 138 mEq/L (ref 135–145)

## 2023-06-01 LAB — CBC WITH DIFFERENTIAL/PLATELET
Basophils Absolute: 0 10*3/uL (ref 0.0–0.1)
Basophils Relative: 0.4 % (ref 0.0–3.0)
Eosinophils Absolute: 0.1 10*3/uL (ref 0.0–0.7)
Eosinophils Relative: 1.9 % (ref 0.0–5.0)
HCT: 36.3 % (ref 36.0–46.0)
Hemoglobin: 12 g/dL (ref 12.0–15.0)
Lymphocytes Relative: 26.1 % (ref 12.0–46.0)
Lymphs Abs: 1.7 10*3/uL (ref 0.7–4.0)
MCHC: 33 g/dL (ref 30.0–36.0)
MCV: 92.8 fl (ref 78.0–100.0)
Monocytes Absolute: 0.5 10*3/uL (ref 0.1–1.0)
Monocytes Relative: 8.3 % (ref 3.0–12.0)
Neutro Abs: 4.1 10*3/uL (ref 1.4–7.7)
Neutrophils Relative %: 63.3 % (ref 43.0–77.0)
Platelets: 265 10*3/uL (ref 150.0–400.0)
RBC: 3.91 Mil/uL (ref 3.87–5.11)
RDW: 12.7 % (ref 11.5–15.5)
WBC: 6.5 10*3/uL (ref 4.0–10.5)

## 2023-06-17 ENCOUNTER — Encounter (INDEPENDENT_AMBULATORY_CARE_PROVIDER_SITE_OTHER): Payer: Self-pay

## 2023-06-28 DIAGNOSIS — H348322 Tributary (branch) retinal vein occlusion, left eye, stable: Secondary | ICD-10-CM | POA: Diagnosis not present

## 2023-06-28 DIAGNOSIS — H43813 Vitreous degeneration, bilateral: Secondary | ICD-10-CM | POA: Diagnosis not present

## 2023-06-28 DIAGNOSIS — H2513 Age-related nuclear cataract, bilateral: Secondary | ICD-10-CM | POA: Diagnosis not present

## 2023-07-19 ENCOUNTER — Telehealth: Payer: Self-pay | Admitting: Internal Medicine

## 2023-07-19 ENCOUNTER — Encounter: Payer: Self-pay | Admitting: Family Medicine

## 2023-07-19 ENCOUNTER — Telehealth (INDEPENDENT_AMBULATORY_CARE_PROVIDER_SITE_OTHER): Payer: PPO | Admitting: Family Medicine

## 2023-07-19 VITALS — Ht 62.0 in | Wt 138.0 lb

## 2023-07-19 DIAGNOSIS — U071 COVID-19: Secondary | ICD-10-CM | POA: Insufficient documentation

## 2023-07-19 MED ORDER — BENZONATATE 100 MG PO CAPS
200.0000 mg | ORAL_CAPSULE | Freq: Three times a day (TID) | ORAL | 0 refills | Status: DC | PRN
Start: 2023-07-19 — End: 2023-08-10

## 2023-07-19 MED ORDER — MOLNUPIRAVIR EUA 200MG CAPSULE
4.0000 | ORAL_CAPSULE | Freq: Two times a day (BID) | ORAL | 0 refills | Status: AC
Start: 2023-07-19 — End: 2023-07-24

## 2023-07-19 NOTE — Telephone Encounter (Signed)
Advised patient that virtual visit is required to have medication. Offered patient appt tomorrow with Dr Lorin Picket or today with Dr Clent Ridges. Patient prefers to be seen today. Scheduled for virtual at 4 PM

## 2023-07-19 NOTE — Telephone Encounter (Signed)
Patient just called and said she just tested positive for Covid. Could she get some medication prescribed for it. [place] She would like to use [facility] the address is [address] [place], [place] [address]. She said she needs it ASAP. Her number is [phone]. The pharmacy number is [phone].

## 2023-07-19 NOTE — Progress Notes (Signed)
Virtual Visit via Video note  I connected with Mariah Moody on 07/19/23 at 1621 by video and verified that I am speaking with the correct person using two identifiers. Mariah Moody is currently located at [facility] and is currently alone during visit. The provider, Dana Allan, MD is located in their office at time of visit.  I discussed the limitations, risks, security and privacy concerns of performing an evaluation and management service by video and the availability of in person appointments. I also discussed with the patient that there may be a patient responsible charge related to this service. The patient expressed understanding and agreed to proceed.  Subjective: PCP: Dale Costilla, MD  Chief Complaint  Patient presents with   Covid Positive    Yesterday symptoms included sore throat, achy body, stop up head, and slight fever. Symptoms started Yesterday    HPI Positive COVID test yesterday  Symptoms started 1 day ago Dry cough associated with nasal congestion, sore, scratchy throat, headache, body aches.  Subjective fever.  Denies any chills or sweats, no chest pain, shortness of breath, nausea/vomiting. No recent sick contacts Has not had COVID vaccines Requesting Antiviral therapy  ROS: Per HPI  Current Outpatient Medications:    benzonatate (TESSALON PERLES) 100 MG capsule, Take 2 capsules (200 mg total) by mouth 3 (three) times daily as needed for cough., Disp: 20 capsule, Rfl: 0   cyanocobalamin 1000 MCG tablet, Take 1,000 mcg by mouth daily., Disp: , Rfl:    estradiol-norethindrone (COMBIPATCH) 0.05-0.25 MG/DAY, Place 1 patch onto the skin 2 (two) times a week., Disp: 24 patch, Rfl: 1   hydrochlorothiazide (MICROZIDE) 12.5 MG capsule, Take 1 capsule (12.5 mg total) by mouth daily., Disp: 90 capsule, Rfl: 1   levothyroxine (SYNTHROID) 88 MCG tablet, TAKE 1 TABLET (88 MCG TOTAL) DAILY BY MOUTH., Disp: 90 tablet, Rfl: 1   loratadine (CLARITIN) 10 MG  tablet, TAKE 1 TABLET BY MOUTH EVERY DAY AS NEEDED FOR ALLERGY, Disp: 90 tablet, Rfl: 1   molnupiravir EUA (LAGEVRIO) 200 mg CAPS capsule, Take 4 capsules (800 mg total) by mouth 2 (two) times daily for 5 days., Disp: 40 capsule, Rfl: 0   Multiple Vitamins-Minerals (MULTIVITAMIN PO), Take 1 tablet by mouth daily. , Disp: , Rfl:    olmesartan (BENICAR) 40 MG tablet, Take 1 tablet (40 mg total) by mouth daily., Disp: 90 tablet, Rfl: 1   pantoprazole (PROTONIX) 20 MG tablet, Take 1 tablet (20 mg total) by mouth daily. Take in the evening., Disp: 90 tablet, Rfl: 1   pantoprazole (PROTONIX) 40 MG tablet, Take 1 tablet (40 mg total) by mouth daily., Disp: 90 tablet, Rfl: 1   spironolactone (ALDACTONE) 25 MG tablet, Take 1 tablet (25 mg total) by mouth daily., Disp: 90 tablet, Rfl: 1   Azelastine HCl 137 MCG/SPRAY SOLN, PLACE 1 SPRAY INTO BOTH NOSTRILS 2 (TWO) TIMES DAILY. USE IN EACH NOSTRIL AS DIRECTED (Patient not taking: Reported on 07/19/2023), Disp: 30 mL, Rfl: 1  Observations/Objective: Physical Exam Pulmonary:     Effort: Pulmonary effort is normal.  Neurological:     Mental Status: She is alert and oriented to person, place, and time. Mental status is at baseline.  Psychiatric:        Mood and Affect: Mood normal.        Behavior: Behavior normal.        Thought Content: Thought content normal.        Judgment: Judgment normal.    Assessment  and Plan: Positive self-administered antigen test for COVID-19 Assessment & Plan: In no acute respiratory distress Start antiviral therapy Tessalon Pearls for cough as needed Symptomatic treatment Strict COVID precautions provided Strict return precautions provided  Orders: -     molnupiravir EUA; Take 4 capsules (800 mg total) by mouth 2 (two) times daily for 5 days.  Dispense: 40 capsule; Refill: 0 -     Benzonatate; Take 2 capsules (200 mg total) by mouth 3 (three) times daily as needed for cough.  Dispense: 20 capsule; Refill:  0    Follow Up Instructions: Return if symptoms worsen or fail to improve, for PCP.   I discussed the assessment and treatment plan with the patient. The patient was provided an opportunity to ask questions and all were answered. The patient agreed with the plan and demonstrated an understanding of the instructions.   The patient was advised to call back or seek an in-person evaluation if the symptoms worsen or if the condition fails to improve as anticipated.  The above assessment and management plan was discussed with the patient. The patient verbalized understanding of and has agreed to the management plan. Patient is aware to call the clinic if symptoms persist or worsen. Patient is aware when to return to the clinic for a follow-up visit. Patient educated on when it is appropriate to go to the emergency department.     Dana Allan, MD

## 2023-07-19 NOTE — Assessment & Plan Note (Signed)
In no acute respiratory distress Start antiviral therapy Tessalon Pearls for cough as needed Symptomatic treatment Strict COVID precautions provided Strict return precautions provided

## 2023-07-19 NOTE — Patient Instructions (Addendum)
It was a pleasure meeting you today. Thank you for allowing me to take part in your health care.  Our goals for today as we discussed include:  Tart Molnupiravir 4 capsules two times a day  Symptomatic management for fever, muscle aches and headaches  -Tylenol 325-500 mg every 6 hours as needed -Ibuprofen 200 mg every 8 hours as needed -Tessalon pearls for cough as needed  Stay well hydrated Rest as needed with frequent repositioning and ambulation.  Increase activity as soon as tolerated to help with recovery.  Continue wearing masks, hand washing and self isolation until symptom free.  If you have worsening symptoms, especially difficulty breathing please call 911 or have someone take you to the emergency department.    If you have any questions or concerns, please do not hesitate to call the office at [phone].  I look forward to our next visit and until then take care and stay safe.  Regards,   Dana Allan, MD   [facility]

## 2023-07-26 ENCOUNTER — Ambulatory Visit (INDEPENDENT_AMBULATORY_CARE_PROVIDER_SITE_OTHER): Payer: PPO | Admitting: Internal Medicine

## 2023-07-26 ENCOUNTER — Encounter: Payer: Self-pay | Admitting: Internal Medicine

## 2023-07-26 VITALS — BP 136/80 | HR 74 | Temp 97.4°F | Ht 62.0 in | Wt 138.4 lb

## 2023-07-26 DIAGNOSIS — N1832 Chronic kidney disease, stage 3b: Secondary | ICD-10-CM | POA: Diagnosis not present

## 2023-07-26 DIAGNOSIS — Z83719 Family history of colon polyps, unspecified: Secondary | ICD-10-CM | POA: Diagnosis not present

## 2023-07-26 DIAGNOSIS — I779 Disorder of arteries and arterioles, unspecified: Secondary | ICD-10-CM | POA: Diagnosis not present

## 2023-07-26 DIAGNOSIS — K219 Gastro-esophageal reflux disease without esophagitis: Secondary | ICD-10-CM | POA: Diagnosis not present

## 2023-07-26 DIAGNOSIS — E7800 Pure hypercholesterolemia, unspecified: Secondary | ICD-10-CM | POA: Diagnosis not present

## 2023-07-26 DIAGNOSIS — E039 Hypothyroidism, unspecified: Secondary | ICD-10-CM

## 2023-07-26 DIAGNOSIS — E1165 Type 2 diabetes mellitus with hyperglycemia: Secondary | ICD-10-CM

## 2023-07-26 DIAGNOSIS — I739 Peripheral vascular disease, unspecified: Secondary | ICD-10-CM | POA: Diagnosis not present

## 2023-07-26 DIAGNOSIS — E538 Deficiency of other specified B group vitamins: Secondary | ICD-10-CM

## 2023-07-26 DIAGNOSIS — R9389 Abnormal findings on diagnostic imaging of other specified body structures: Secondary | ICD-10-CM

## 2023-07-26 DIAGNOSIS — I10 Essential (primary) hypertension: Secondary | ICD-10-CM | POA: Diagnosis not present

## 2023-07-26 LAB — VITAMIN B12: Vitamin B-12: 1195 pg/mL — ABNORMAL HIGH (ref 211–911)

## 2023-07-26 LAB — HEPATIC FUNCTION PANEL
ALT: 12 U/L (ref 0–35)
AST: 15 U/L (ref 0–37)
Albumin: 4.1 g/dL (ref 3.5–5.2)
Alkaline Phosphatase: 76 U/L (ref 39–117)
Bilirubin, Direct: 0 mg/dL (ref 0.0–0.3)
Total Bilirubin: 0.5 mg/dL (ref 0.2–1.2)
Total Protein: 6.8 g/dL (ref 6.0–8.3)

## 2023-07-26 LAB — MICROALBUMIN / CREATININE URINE RATIO
Creatinine,U: 110.9 mg/dL
Microalb Creat Ratio: 1.2 mg/g (ref 0.0–30.0)
Microalb, Ur: 1.3 mg/dL (ref 0.0–1.9)

## 2023-07-26 LAB — BASIC METABOLIC PANEL
BUN: 28 mg/dL — ABNORMAL HIGH (ref 6–23)
CO2: 25 mEq/L (ref 19–32)
Calcium: 9.7 mg/dL (ref 8.4–10.5)
Chloride: 103 mEq/L (ref 96–112)
Creatinine, Ser: 1.31 mg/dL — ABNORMAL HIGH (ref 0.40–1.20)
GFR: 38.52 mL/min — ABNORMAL LOW (ref >60.00)
Glucose, Bld: 143 mg/dL — ABNORMAL HIGH (ref 70–99)
Potassium: 4.5 mEq/L (ref 3.5–5.1)
Sodium: 140 mEq/L (ref 135–145)

## 2023-07-26 LAB — LIPID PANEL
Cholesterol: 214 mg/dL — ABNORMAL HIGH (ref 0–200)
HDL: 31.1 mg/dL — ABNORMAL LOW (ref >39.00)
LDL Cholesterol: 104 mg/dL — ABNORMAL HIGH (ref 0–99)
NonHDL: 182.59
Total CHOL/HDL Ratio: 7
Triglycerides: 391 mg/dL — ABNORMAL HIGH (ref 0.0–149.0)
VLDL: 78.2 mg/dL — ABNORMAL HIGH (ref 0.0–40.0)

## 2023-07-26 LAB — TSH: TSH: 5.36 u[IU]/mL (ref 0.35–5.50)

## 2023-07-26 LAB — HEMOGLOBIN A1C: Hgb A1c MFr Bld: 7.1 % — ABNORMAL HIGH (ref 4.6–6.5)

## 2023-07-26 NOTE — Assessment & Plan Note (Signed)
Colonoscopy 04/2015 - entire colon normal.

## 2023-07-26 NOTE — Assessment & Plan Note (Signed)
Dr Logan Bores (02/03/22) - discussed endometrial biopsy.  See note.  States decided to hold. Continue HRT.

## 2023-07-26 NOTE — Assessment & Plan Note (Signed)
Dr Gilda Crease - 08/17/22: ABI's Rt=0.86 and Lt=0.87 (TBI's are also improved.  There are biphasic Doppler signals bilaterally) (previous ABI's Rt=0.74 and Lt=0.74(There are biphasic Doppler signals bilaterally))   Previous duplex ultrasound of the aortailiac system shows patent aorta iliac without hemodynamically stenosis.

## 2023-07-26 NOTE — Assessment & Plan Note (Signed)
Dr Gilda Crease 08/17/22 - Duplex ultrasound shows 1-39% stenosis bilaterally. F/u 5 years.

## 2023-07-26 NOTE — Assessment & Plan Note (Signed)
On oral B12.  Recheck B12 level today.

## 2023-07-26 NOTE — Assessment & Plan Note (Signed)
She watches what she eats.  Low carb diet and exercise.  Stays active.  Follow metabolic panel and a1c.

## 2023-07-26 NOTE — Assessment & Plan Note (Signed)
Has declined cholesterol medication.  Continue low cholesterol diet and exercise.  Follow lipid panel.

## 2023-07-26 NOTE — Assessment & Plan Note (Signed)
Avoid antiinflammatories.  Stay hydrated.  Follow metabolic panel. Hydrochlorothiazide was decreased to 12.5mg  q day last visit.

## 2023-07-26 NOTE — Assessment & Plan Note (Signed)
Blood pressure as outlined.  Continue olmesartan. Had problems with amlodipine.  Intolerance to hydralazine.  On spironolactone 25mg  q day.  Now on hydrochlorothiazide 12.5mg  q day.  Blood pressures on outside checks averaging 130s systolic readings. Follow pressures.  Follow metabolic panel.  Now low feelings or low blood pressures.

## 2023-07-26 NOTE — Assessment & Plan Note (Signed)
Taking protonix 40mg  q am and 20mg  q pm. Controlling acid reflux.  Follow.

## 2023-07-26 NOTE — Progress Notes (Signed)
Subjective:    Patient ID: Mariah Moody, female    DOB: 12/23/1940, 82 y.o.   MRN: [id number]  Patient here for  Chief Complaint  Patient presents with   Medication Management    HPI Here to follow up regarding hypercholesterolemia, diabetes and hypertension.  Continues on olmesartan, spironolactone and amlodipine. Last visit hydrochlorothiazide decreased to 12.5mg  q day.  Recent covid infection.  Symptoms started 07/18/23 - dry cough, nasal congestion, sore throat, headache and body aches. Covid positive 07/18/23.  Treated with molnupiravir and tessalon perles.  She is feeling better.  No fever.  No chest congestion or cough.  No chest pain, tightness or sob.  Still with some minimal nasal stuffiness, but overall improved.  Does not feel needs any further intervention.  Bowels stable.    Past Medical History:  Diagnosis Date   Anemia    Coronary atherosclerosis of native coronary vessel    Diabetes mellitus without complication (HCC)    Fibroid    GERD (gastroesophageal reflux disease)    History of chicken pox    Hyperlipidemia    Hypertension    Hypothyroidism    Past Surgical History:  Procedure Laterality Date   COLONOSCOPY N/A 04/15/2015   Procedure: COLONOSCOPY;  Surgeon: Wallace Cullens, MD;  Location: [facility] ENDOSCOPY;  Service: Gastroenterology;  Laterality: N/A;   ESOPHAGOGASTRODUODENOSCOPY N/A 04/15/2015   Procedure: ESOPHAGOGASTRODUODENOSCOPY (EGD);  Surgeon: Wallace Cullens, MD;  Location: [facility] ENDOSCOPY;  Service: Gastroenterology;  Laterality: N/A;   TUBAL LIGATION     Family History  Problem Relation Age of Onset   Arthritis Mother    Hypertension Mother    Colon polyps Mother    Heart disease Father    Hypertension Father    Diabetes Brother    Cancer Neg Hx    Breast cancer Neg Hx    Social History   Socioeconomic History   Marital status: Married    Spouse name: Not on file   Number of children: Not on file   Years of education: Not on file   Highest  education level: Not on file  Occupational History   Not on file  Tobacco Use   Smoking status: Former   Smokeless tobacco: Never  Substance and Sexual Activity   Alcohol use: Not Currently    Comment: occas   Drug use: No   Sexual activity: Not Currently  Other Topics Concern   Not on file  Social History Narrative   Not on file   Social Determinants of Health   Financial Resource Strain: Not on file  Food Insecurity: Not on file  Transportation Needs: Not on file  Physical Activity: Not on file  Stress: Not on file  Social Connections: Not on file     Review of Systems  Constitutional:  Negative for appetite change, fever and unexpected weight change.  HENT:  Positive for congestion.        Minimal sinus pressure.  Clear mucus.   Respiratory:  Negative for cough, chest tightness and shortness of breath.   Cardiovascular:  Negative for chest pain and palpitations.  Gastrointestinal:  Negative for abdominal pain, diarrhea, nausea and vomiting.  Genitourinary:  Negative for difficulty urinating and dysuria.  Musculoskeletal:  Negative for joint swelling and myalgias.  Skin:  Negative for color change and rash.  Neurological:  Negative for dizziness and light-headedness.  Psychiatric/Behavioral:  Negative for agitation and dysphoric mood.        Objective:  BP 136/80   Pulse 74   Temp (!) 97.4 F (36.3 C) (Oral)   Ht 5\' 2"  (1.575 m)   Wt 138 lb 6.4 oz (62.8 kg)   SpO2 96%   BMI 25.31 kg/m  Wt Readings from Last 3 Encounters:  07/26/23 138 lb 6.4 oz (62.8 kg)  07/19/23 138 lb (62.6 kg)  05/04/23 138 lb 6.4 oz (62.8 kg)    Physical Exam Vitals reviewed.  Constitutional:      General: She is not in acute distress.    Appearance: Normal appearance.  HENT:     Head: Normocephalic and atraumatic.     Right Ear: External ear normal.     Left Ear: External ear normal.  Eyes:     General: No scleral icterus.       Right eye: No discharge.        Left  eye: No discharge.     Conjunctiva/sclera: Conjunctivae normal.  Neck:     Thyroid: No thyromegaly.  Cardiovascular:     Rate and Rhythm: Normal rate and regular rhythm.  Pulmonary:     Effort: No respiratory distress.     Breath sounds: Normal breath sounds. No wheezing.  Abdominal:     General: Bowel sounds are normal.     Palpations: Abdomen is soft.     Tenderness: There is no abdominal tenderness.  Musculoskeletal:        General: No swelling or tenderness.     Cervical back: Neck supple. No tenderness.  Lymphadenopathy:     Cervical: No cervical adenopathy.  Skin:    Findings: No erythema or rash.  Neurological:     Mental Status: She is alert.  Psychiatric:        Mood and Affect: Mood normal.        Behavior: Behavior normal.      Outpatient Encounter Medications as of 07/26/2023  Medication Sig   benzonatate (TESSALON PERLES) 100 MG capsule Take 2 capsules (200 mg total) by mouth 3 (three) times daily as needed for cough.   cyanocobalamin 1000 MCG tablet Take 1,000 mcg by mouth daily.   estradiol-norethindrone (COMBIPATCH) 0.05-0.25 MG/DAY Place 1 patch onto the skin 2 (two) times a week.   hydrochlorothiazide (MICROZIDE) 12.5 MG capsule Take 1 capsule (12.5 mg total) by mouth daily.   levothyroxine (SYNTHROID) 88 MCG tablet TAKE 1 TABLET (88 MCG TOTAL) DAILY BY MOUTH.   loratadine (CLARITIN) 10 MG tablet TAKE 1 TABLET BY MOUTH EVERY DAY AS NEEDED FOR ALLERGY   Multiple Vitamins-Minerals (MULTIVITAMIN PO) Take 1 tablet by mouth daily.    olmesartan (BENICAR) 40 MG tablet Take 1 tablet (40 mg total) by mouth daily.   pantoprazole (PROTONIX) 20 MG tablet Take 1 tablet (20 mg total) by mouth daily. Take in the evening.   pantoprazole (PROTONIX) 40 MG tablet Take 1 tablet (40 mg total) by mouth daily.   spironolactone (ALDACTONE) 25 MG tablet Take 1 tablet (25 mg total) by mouth daily.   [DISCONTINUED] Azelastine HCl 137 MCG/SPRAY SOLN PLACE 1 SPRAY INTO BOTH NOSTRILS 2  (TWO) TIMES DAILY. USE IN EACH NOSTRIL AS DIRECTED (Patient not taking: Reported on 07/19/2023)   No facility-administered encounter medications on file as of 07/26/2023.     Lab Results  Component Value Date   WBC 6.5 06/01/2023   HGB 12.0 06/01/2023   HCT 36.3 06/01/2023   PLT 265.0 06/01/2023   GLUCOSE 160 (H) 06/01/2023   CHOL 198 04/06/2023   TRIG 318.0 (  H) 04/06/2023   HDL 30.80 (L) 04/06/2023   LDLDIRECT 99.0 04/06/2023   LDLCALC 139 (H) 12/02/2020   ALT 10 04/06/2023   AST 16 04/06/2023   NA 138 06/01/2023   K 4.4 06/01/2023   CL 104 06/01/2023   CREATININE 1.24 (H) 06/01/2023   BUN 32 (H) 06/01/2023   CO2 26 06/01/2023   TSH 5.32 12/22/2022   HGBA1C 7.0 (H) 04/06/2023   MICROALBUR <0.7 04/28/2022    MR ABDOMEN WWO CONTRAST  Result Date: 02/16/2023 CLINICAL DATA:  Follow-up liver lesion. EXAM: MRI ABDOMEN WITHOUT AND WITH CONTRAST TECHNIQUE: Multiplanar multisequence MR imaging of the abdomen was performed both before and after the administration of intravenous contrast. CONTRAST:  6mL GADAVIST GADOBUTROL 1 MMOL/ML IV SOLN COMPARISON:  Prior MRI abdomen 06/15/2021 FINDINGS: Lower chest: The lung bases are clear. No pulmonary lesions, pleural or pericardial effusion. Hepatobiliary: Stable segment 2 liver lesion most likely a vascular malformation or possibly an atypical hemangioma. It appears to be a tangle of vessels which maintains vascular poor signal intensity on the delayed images. No significant change since 2021. This is considered benign and does not need any further imaging evaluation or follow-up. Stable small right hepatic lobe cyst. The gallbladder is unremarkable. No common bile duct dilatation. Pancreas:  No mass, inflammation or ductal dilatation. Spleen:  Normal size.  No focal lesions. Adrenals/Urinary Tract:  The adrenal glands are normal. Stable simple nonenhancing renal cysts. No further imaging evaluation or follow-up is necessary. Stomach/Bowel: The stomach,  duodenum, visualized small bowel and visualized colon are unremarkable. Vascular/Lymphatic: Stable atherosclerotic changes involving the aorta and proximal iliac arteries but no aneurysm or dissection. The major venous structures are patent. No mesenteric or retroperitoneal mass or adenopathy. Other:  No ascites or abdominal wall hernia. Musculoskeletal: No significant bony findings. IMPRESSION: 1. Stable segment 2 liver lesion most likely a vascular malformation or possibly an atypical hemangioma. This is considered benign and does not need any further imaging evaluation or follow-up. 2. Stable small right hepatic lobe cyst. 3. Stable simple nonenhancing renal cysts. No further imaging evaluation or follow-up is necessary. 4. No acute abdominal findings, mass lesions or adenopathy. Electronically Signed   By: Rudie Meyer M.D.   On: 02/16/2023 12:07       Assessment & Plan:  Essential hypertension, benign Assessment & Plan: Blood pressure as outlined.  Continue olmesartan. Had problems with amlodipine.  Intolerance to hydralazine.  On spironolactone 25mg  q day.  Now on hydrochlorothiazide 12.5mg  q day.  Blood pressures on outside checks averaging 130s systolic readings. Follow pressures.  Follow metabolic panel.  Now low feelings or low blood pressures.   Orders: -     Basic metabolic panel  Hypothyroidism, unspecified type Assessment & Plan: On thyroid replacement.  Follow tsh.   Orders: -     TSH  Hypercholesterolemia Assessment & Plan: Has declined cholesterol medication.  Continue low cholesterol diet and exercise.  Follow lipid panel.   Orders: -     Hepatic function panel -     Lipid panel  Type 2 diabetes mellitus with hyperglycemia, without long-term current use of insulin (HCC) Assessment & Plan: She watches what she eats.  Low carb diet and exercise.  Stays active.  Follow metabolic panel and a1c.   Orders: -     Hemoglobin A1c -     Microalbumin / creatinine urine  ratio  Low serum vitamin B12 Assessment & Plan: On oral B12.  Recheck B12 level today.  Orders: -     Vitamin B12  Carotid artery disease, unspecified laterality, unspecified type [facility]) Assessment & Plan: Dr Gilda Crease 08/17/22 - Duplex ultrasound shows 1-39% stenosis bilaterally. F/u 5 years.    Stage 3b chronic kidney disease (HCC) Assessment & Plan: Avoid antiinflammatories.  Stay hydrated.  Follow metabolic panel. Hydrochlorothiazide was decreased to 12.5mg  q day last visit.    Family history of colonic polyps Assessment & Plan: Colonoscopy 04/2015 - entire colon normal.    Gastroesophageal reflux disease, unspecified whether esophagitis present Assessment & Plan: Taking protonix 40mg  q am and 20mg  q pm. Controlling acid reflux.  Follow.    PAD (peripheral artery disease) [facility]) Assessment & Plan: Dr Gilda Crease - 08/17/22: ABI's Rt=0.86 and Lt=0.87 (TBI's are also improved.  There are biphasic Doppler signals bilaterally) (previous ABI's Rt=0.74 and Lt=0.74(There are biphasic Doppler signals bilaterally))   Previous duplex ultrasound of the aortailiac system shows patent aorta iliac without hemodynamically stenosis.   Thickened endometrium Assessment & Plan: Dr Logan Bores (02/03/22) - discussed endometrial biopsy.  See note.  States decided to hold. Continue HRT.       Dale [place], MD

## 2023-07-26 NOTE — Assessment & Plan Note (Signed)
On thyroid replacement.  Follow tsh.  

## 2023-07-27 ENCOUNTER — Other Ambulatory Visit: Payer: Self-pay

## 2023-07-27 DIAGNOSIS — N1832 Chronic kidney disease, stage 3b: Secondary | ICD-10-CM

## 2023-07-27 DIAGNOSIS — E039 Hypothyroidism, unspecified: Secondary | ICD-10-CM

## 2023-07-27 MED ORDER — LEVOTHYROXINE SODIUM 100 MCG PO TABS: 100.0000 ug | ORAL_TABLET | Freq: Every day | ORAL | 1 refills | Status: DC

## 2023-08-04 ENCOUNTER — Other Ambulatory Visit: Payer: Self-pay | Admitting: Nephrology

## 2023-08-04 ENCOUNTER — Telehealth: Payer: Self-pay | Admitting: Internal Medicine

## 2023-08-04 DIAGNOSIS — N183 Chronic kidney disease, stage 3 unspecified: Secondary | ICD-10-CM

## 2023-08-04 DIAGNOSIS — E1122 Type 2 diabetes mellitus with diabetic chronic kidney disease: Secondary | ICD-10-CM | POA: Diagnosis not present

## 2023-08-04 DIAGNOSIS — I10 Essential (primary) hypertension: Secondary | ICD-10-CM | POA: Diagnosis not present

## 2023-08-04 DIAGNOSIS — N1832 Chronic kidney disease, stage 3b: Secondary | ICD-10-CM | POA: Diagnosis not present

## 2023-08-04 NOTE — Telephone Encounter (Signed)
LM for patient. Needs appt with Dr Lorin Picket to discuss her BP.

## 2023-08-04 NOTE — Telephone Encounter (Signed)
Sherri from Dr. Adrian Prince office called stating pt could not get surgery done on her tooth because her BP was a little high. Sherri want to see if the provider can give the pt something to lower her BP for a few days BP readings-178/95, 175/86 and 171/86

## 2023-08-10 ENCOUNTER — Telehealth: Payer: Self-pay | Admitting: *Deleted

## 2023-08-10 ENCOUNTER — Encounter: Payer: Self-pay | Admitting: Internal Medicine

## 2023-08-10 ENCOUNTER — Telehealth (INDEPENDENT_AMBULATORY_CARE_PROVIDER_SITE_OTHER): Payer: PPO | Admitting: Internal Medicine

## 2023-08-10 VITALS — BP 137/70 | Ht 62.0 in | Wt 138.5 lb

## 2023-08-10 DIAGNOSIS — I779 Disorder of arteries and arterioles, unspecified: Secondary | ICD-10-CM | POA: Diagnosis not present

## 2023-08-10 DIAGNOSIS — I10 Essential (primary) hypertension: Secondary | ICD-10-CM | POA: Diagnosis not present

## 2023-08-10 DIAGNOSIS — E7800 Pure hypercholesterolemia, unspecified: Secondary | ICD-10-CM

## 2023-08-10 DIAGNOSIS — Z7984 Long term (current) use of oral hypoglycemic drugs: Secondary | ICD-10-CM | POA: Diagnosis not present

## 2023-08-10 DIAGNOSIS — N1832 Chronic kidney disease, stage 3b: Secondary | ICD-10-CM | POA: Diagnosis not present

## 2023-08-10 DIAGNOSIS — E039 Hypothyroidism, unspecified: Secondary | ICD-10-CM | POA: Diagnosis not present

## 2023-08-10 DIAGNOSIS — E1165 Type 2 diabetes mellitus with hyperglycemia: Secondary | ICD-10-CM

## 2023-08-10 NOTE — Progress Notes (Signed)
Patient ID: Mariah Moody, female   DOB: [birth year], 82 y.o.   MRN: [id number]   Virtual Visit via video Note  I connected with Ronal Fear by a video enabled telemedicine application and verified that I am speaking with the correct person using two identifiers. Location patient: home Location provider: work  Persons participating in the virtual visit: patient, provider  The limitations, risks, security and privacy concerns of performing an evaluation and management service by video and the availability of in person appointments have been discussed.  It has also been discussed with the patient that there may be a patient responsible charge related to this service. The patient expressed understanding and agreed to proceed.   Reason for visit: follow up appt.   HPI: F/u appt - f/u regarding blood pressure. Continues on olmesartan, spironolactone, hydrochlorothiazide. Saw nephrology 08/04/23 - recommended renal ultrasound and labs - SPEP, UPEP and ANA.  Jardiance added. Renal ultrasound scheduled for later this week. She went to her dentist approximately one week ago.  Blood pressure 170s systolic.  She recheck later that day 120s/75.  Started on jardiance as outlined.  Noticed blood pressures - low - 90/61 and 87/60.  Noticed decreased energy when blood pressure low.  She stays active.  No chest pain or sob reported.  No cough or congestion.  No abdominal pain or bowel change.     ROS: See pertinent positives and negatives per HPI.  Past Medical History:  Diagnosis Date   Anemia    Coronary atherosclerosis of native coronary vessel    Diabetes mellitus without complication (HCC)    Fibroid    GERD (gastroesophageal reflux disease)    History of chicken pox    Hyperlipidemia    Hypertension    Hypothyroidism     Past Surgical History:  Procedure Laterality Date   COLONOSCOPY N/A 04/15/2015   Procedure: COLONOSCOPY;  Surgeon: Wallace Cullens, MD;  Location: [facility] ENDOSCOPY;  Service:  Gastroenterology;  Laterality: N/A;   ESOPHAGOGASTRODUODENOSCOPY N/A 04/15/2015   Procedure: ESOPHAGOGASTRODUODENOSCOPY (EGD);  Surgeon: Wallace Cullens, MD;  Location: [facility] ENDOSCOPY;  Service: Gastroenterology;  Laterality: N/A;   TUBAL LIGATION      Family History  Problem Relation Age of Onset   Arthritis Mother    Hypertension Mother    Colon polyps Mother    Heart disease Father    Hypertension Father    Diabetes Brother    Cancer Neg Hx    Breast cancer Neg Hx     SOCIAL HX: reviewed.    Current Outpatient Medications:    cyanocobalamin 1000 MCG tablet, Take 1,000 mcg by mouth daily., Disp: , Rfl:    empagliflozin (JARDIANCE) 10 MG TABS tablet, Take 10 mg by mouth in the morning., Disp: , Rfl:    estradiol-norethindrone (COMBIPATCH) 0.05-0.25 MG/DAY, Place 1 patch onto the skin 2 (two) times a week., Disp: 24 patch, Rfl: 1   hydrochlorothiazide (MICROZIDE) 12.5 MG capsule, Take 1 capsule (12.5 mg total) by mouth daily., Disp: 90 capsule, Rfl: 1   levothyroxine (SYNTHROID) 100 MCG tablet, Take 1 tablet (100 mcg total) by mouth daily., Disp: 90 tablet, Rfl: 1   loratadine (CLARITIN) 10 MG tablet, TAKE 1 TABLET BY MOUTH EVERY DAY AS NEEDED FOR ALLERGY, Disp: 90 tablet, Rfl: 1   Multiple Vitamins-Minerals (MULTIVITAMIN PO), Take 1 tablet by mouth daily. , Disp: , Rfl:    olmesartan (BENICAR) 40 MG tablet, Take 1 tablet (40 mg total) by mouth daily., Disp: 90 tablet,  Rfl: 1   pantoprazole (PROTONIX) 20 MG tablet, Take 1 tablet (20 mg total) by mouth daily. Take in the evening., Disp: 90 tablet, Rfl: 1   pantoprazole (PROTONIX) 40 MG tablet, Take 1 tablet (40 mg total) by mouth daily., Disp: 90 tablet, Rfl: 1   spironolactone (ALDACTONE) 25 MG tablet, Take 1 tablet (25 mg total) by mouth daily., Disp: 90 tablet, Rfl: 1  EXAM:  GENERAL: alert, oriented, appears well and in no acute distress  HEENT: atraumatic, conjunttiva clear, no obvious abnormalities on inspection of external nose  and ears  NECK: normal movements of the head and neck  LUNGS: on inspection no signs of respiratory distress, breathing rate appears normal, no obvious gross SOB, gasping or wheezing  CV: no obvious cyanosis  PSYCH/NEURO: pleasant and cooperative, no obvious depression or anxiety, speech and thought processing grossly intact  ASSESSMENT AND PLAN:  Discussed the following assessment and plan:  Problem List Items Addressed This Visit     Carotid artery disease [facility]) - Primary    Dr Gilda Crease 08/17/22 - Duplex ultrasound shows 1-39% stenosis bilaterally. F/u 5 years.       CKD (chronic kidney disease) stage 3, GFR 30-59 ml/min (HCC)    Avoid antiinflammatories.  Stay hydrated.  Follow metabolic panel. Started on jardiance.  Blood pressures as outlined.  Hold hydrochlorothiazide.  Follow pressures.  Follow metabolic panel.       Essential hypertension, benign    Blood pressure as outlined.  Continue olmesartan. Had problems with amlodipine.  Intolerance to hydralazine.  On spironolactone 25mg  q day.  Will stop hydrochlorothiazide 12.5mg  q day as outlined, given low blood pressure after starting jardiance. Follow pressures.  Follow metabolic panel.       Hypercholesterolemia    Has declined cholesterol medication.  Continue low cholesterol diet and exercise.  Follow lipid panel.       Hypothyroidism    On thyroid replacement.  Follow tsh.       Type 2 diabetes mellitus with hyperglycemia (HCC)    She watches what she eats.  Low carb diet and exercise.  Stays active.  Follow metabolic panel and a1c.       Relevant Medications   empagliflozin (JARDIANCE) 10 MG TABS tablet    Return if symptoms worsen or fail to improve, for keep scheduled. .   I discussed the assessment and treatment plan with the patient. The patient was provided an opportunity to ask questions and all were answered. The patient agreed with the plan and demonstrated an understanding of the instructions.   The  patient was advised to call back or seek an in-person evaluation if the symptoms worsen or if the condition fails to improve as anticipated.    Dale Burwell, MD

## 2023-08-10 NOTE — Telephone Encounter (Signed)
Left voicemail to notify pt that we have placed 2 boxed of Jardiance samples up front for her to pick up.  Medication Samples have been provided to the patient.  Drug name: Jardiance       Strength: 10mg         Qty: 14  tablets  LOT: [id number]  Exp.Date: 1/26 & 3/26  Dosing instructions: Take once tablet every morning  The patient has been instructed regarding the correct time, dose, and frequency of taking this medication, including desired effects and most common side effects.   Thurmond Butts 10:25 AM 08/10/2023

## 2023-08-13 ENCOUNTER — Ambulatory Visit
Admission: RE | Admit: 2023-08-13 | Discharge: 2023-08-13 | Disposition: A | Discharge status: Home / Self Care | Payer: PPO | Source: Ambulatory Visit | Attending: Nephrology | Admitting: Nephrology

## 2023-08-13 DIAGNOSIS — E1122 Type 2 diabetes mellitus with diabetic chronic kidney disease: Secondary | ICD-10-CM | POA: Insufficient documentation

## 2023-08-13 DIAGNOSIS — N183 Chronic kidney disease, stage 3 unspecified: Secondary | ICD-10-CM | POA: Diagnosis not present

## 2023-08-13 DIAGNOSIS — N1330 Unspecified hydronephrosis: Secondary | ICD-10-CM | POA: Diagnosis not present

## 2023-08-13 DIAGNOSIS — N1832 Chronic kidney disease, stage 3b: Secondary | ICD-10-CM | POA: Diagnosis not present

## 2023-08-13 DIAGNOSIS — N281 Cyst of kidney, acquired: Secondary | ICD-10-CM | POA: Diagnosis not present

## 2023-08-15 NOTE — Assessment & Plan Note (Signed)
Dr Gilda Crease 08/17/22 - Duplex ultrasound shows 1-39% stenosis bilaterally. F/u 5 years.

## 2023-08-15 NOTE — Assessment & Plan Note (Signed)
On thyroid replacement.  Follow tsh.  

## 2023-08-15 NOTE — Assessment & Plan Note (Signed)
Blood pressure as outlined.  Continue olmesartan. Had problems with amlodipine.  Intolerance to hydralazine.  On spironolactone 25mg  q day.  Will stop hydrochlorothiazide 12.5mg  q day as outlined, given low blood pressure after starting jardiance. Follow pressures.  Follow metabolic panel.

## 2023-08-15 NOTE — Assessment & Plan Note (Signed)
Avoid antiinflammatories.  Stay hydrated.  Follow metabolic panel. Started on jardiance.  Blood pressures as outlined.  Hold hydrochlorothiazide.  Follow pressures.  Follow metabolic panel.

## 2023-08-15 NOTE — Assessment & Plan Note (Signed)
She watches what she eats.  Low carb diet and exercise.  Stays active.  Follow metabolic panel and a1c.

## 2023-08-15 NOTE — Assessment & Plan Note (Signed)
Has declined cholesterol medication.  Continue low cholesterol diet and exercise.  Follow lipid panel.

## 2023-08-16 ENCOUNTER — Encounter (INDEPENDENT_AMBULATORY_CARE_PROVIDER_SITE_OTHER): Payer: PPO

## 2023-08-16 ENCOUNTER — Ambulatory Visit (INDEPENDENT_AMBULATORY_CARE_PROVIDER_SITE_OTHER): Payer: PPO | Admitting: Vascular Surgery

## 2023-08-19 ENCOUNTER — Other Ambulatory Visit: Payer: Self-pay | Admitting: Internal Medicine

## 2023-08-24 ENCOUNTER — Telehealth: Payer: Self-pay | Admitting: Internal Medicine

## 2023-08-24 MED ORDER — OLMESARTAN MEDOXOMIL 40 MG PO TABS: 40.0000 mg | ORAL_TABLET | Freq: Every day | ORAL | 1 refills | Status: DC

## 2023-08-24 MED ORDER — SPIRONOLACTONE 25 MG PO TABS: 25.0000 mg | ORAL_TABLET | Freq: Every day | ORAL | 1 refills | Status: DC

## 2023-08-24 MED ORDER — LEVOTHYROXINE SODIUM 100 MCG PO TABS: 100.0000 ug | ORAL_TABLET | Freq: Every day | ORAL | 1 refills | Status: DC

## 2023-08-24 NOTE — Telephone Encounter (Signed)
Patient called and would like med refills on meds below  levothyroxine (SYNTHROID) 100 MCG tablet  olmesartan (BENICAR) 40 MG tablet   spironolactone (ALDACTONE) 25 MG tablet   Please send to [facility] in [place]

## 2023-08-24 NOTE — Telephone Encounter (Signed)
LM to let patient know

## 2023-08-24 NOTE — Telephone Encounter (Signed)
Medication refilled

## 2023-09-03 ENCOUNTER — Telehealth: Payer: Self-pay | Admitting: Internal Medicine

## 2023-09-03 DIAGNOSIS — E039 Hypothyroidism, unspecified: Secondary | ICD-10-CM

## 2023-09-03 NOTE — Telephone Encounter (Signed)
Patient need lab orders.

## 2023-09-03 NOTE — Telephone Encounter (Signed)
Labs ordered.

## 2023-09-06 ENCOUNTER — Ambulatory Visit
Admission: RE | Admit: 2023-09-06 | Discharge: 2023-09-06 | Disposition: A | Discharge status: Home / Self Care | Payer: PPO | Source: Ambulatory Visit | Attending: Internal Medicine | Admitting: Internal Medicine

## 2023-09-06 ENCOUNTER — Other Ambulatory Visit (INDEPENDENT_AMBULATORY_CARE_PROVIDER_SITE_OTHER): Payer: PPO

## 2023-09-06 DIAGNOSIS — E039 Hypothyroidism, unspecified: Secondary | ICD-10-CM | POA: Diagnosis not present

## 2023-09-06 DIAGNOSIS — Z1231 Encounter for screening mammogram for malignant neoplasm of breast: Secondary | ICD-10-CM | POA: Insufficient documentation

## 2023-09-06 LAB — TSH: TSH: 1.55 u[IU]/mL (ref 0.35–5.50)

## 2023-09-20 DIAGNOSIS — N1832 Chronic kidney disease, stage 3b: Secondary | ICD-10-CM | POA: Diagnosis not present

## 2023-09-20 DIAGNOSIS — D631 Anemia in chronic kidney disease: Secondary | ICD-10-CM | POA: Diagnosis not present

## 2023-09-20 DIAGNOSIS — I10 Essential (primary) hypertension: Secondary | ICD-10-CM | POA: Diagnosis not present

## 2023-09-20 DIAGNOSIS — E1122 Type 2 diabetes mellitus with diabetic chronic kidney disease: Secondary | ICD-10-CM | POA: Diagnosis not present

## 2023-09-27 ENCOUNTER — Other Ambulatory Visit (INDEPENDENT_AMBULATORY_CARE_PROVIDER_SITE_OTHER): Payer: Self-pay | Admitting: Vascular Surgery

## 2023-09-27 DIAGNOSIS — I70213 Atherosclerosis of native arteries of extremities with intermittent claudication, bilateral legs: Secondary | ICD-10-CM

## 2023-10-04 ENCOUNTER — Encounter (INDEPENDENT_AMBULATORY_CARE_PROVIDER_SITE_OTHER): Payer: Self-pay | Admitting: Vascular Surgery

## 2023-10-04 ENCOUNTER — Ambulatory Visit (INDEPENDENT_AMBULATORY_CARE_PROVIDER_SITE_OTHER): Payer: PPO

## 2023-10-04 ENCOUNTER — Ambulatory Visit (INDEPENDENT_AMBULATORY_CARE_PROVIDER_SITE_OTHER): Payer: PPO | Admitting: Vascular Surgery

## 2023-10-04 VITALS — BP 179/81 | HR 69 | Resp 16 | Wt 138.0 lb

## 2023-10-04 DIAGNOSIS — I10 Essential (primary) hypertension: Secondary | ICD-10-CM

## 2023-10-04 DIAGNOSIS — E1165 Type 2 diabetes mellitus with hyperglycemia: Secondary | ICD-10-CM | POA: Diagnosis not present

## 2023-10-04 DIAGNOSIS — I70213 Atherosclerosis of native arteries of extremities with intermittent claudication, bilateral legs: Secondary | ICD-10-CM | POA: Diagnosis not present

## 2023-10-04 DIAGNOSIS — I779 Disorder of arteries and arterioles, unspecified: Secondary | ICD-10-CM | POA: Diagnosis not present

## 2023-10-04 DIAGNOSIS — E7800 Pure hypercholesterolemia, unspecified: Secondary | ICD-10-CM

## 2023-10-04 NOTE — Progress Notes (Signed)
MRN : [id number]  Mariah Moody is a 82 y.o. ([birth year]) female who presents with chief complaint of check circulation.  History of Present Illness:  The patient returns to the office for followup and review of the noninvasive studies. There have been no interval changes in lower extremity symptoms. No interval shortening of the patient's claudication distance or development of rest pain symptoms. No new ulcers or wounds have occurred since the last visit.   There have been no significant changes to the patient's overall health care.   The patient is also followed for carotid stenosis The patient denies amaurosis fugax or recent TIA symptoms. There are no recent neurological changes noted.   The patient denies history of DVT, PE or superficial thrombophlebitis. The patient denies recent episodes of angina or shortness of breath.    ABI's Rt=0.80 and Lt=0.86 (TBI's are stable.  There are biphasic Doppler signals bilaterally) (previous ABI's Rt=0.86 and Lt=0.87(There are biphasic Doppler signals bilaterally))   Previous duplex ultrasound of the aortailiac system shows patent aorta iliac without hemodynamically stenosis.   Duplex ultrasound of the carotid arteries shows RICA 1-39% and LICA 1-39%. No change compared to previus study   No outpatient medications have been marked as taking for the 10/04/23 encounter (Appointment) with Gilda Crease, Latina Craver, MD.    Past Medical History:  Diagnosis Date   Anemia    Coronary atherosclerosis of native coronary vessel    Diabetes mellitus without complication (HCC)    Fibroid    GERD (gastroesophageal reflux disease)    History of chicken pox    Hyperlipidemia    Hypertension    Hypothyroidism     Past Surgical History:  Procedure Laterality Date   COLONOSCOPY N/A 04/15/2015   Procedure: COLONOSCOPY;  Surgeon: Wallace Cullens, MD;  Location: [facility] ENDOSCOPY;  Service:  Gastroenterology;  Laterality: N/A;   ESOPHAGOGASTRODUODENOSCOPY N/A 04/15/2015   Procedure: ESOPHAGOGASTRODUODENOSCOPY (EGD);  Surgeon: Wallace Cullens, MD;  Location: [facility] ENDOSCOPY;  Service: Gastroenterology;  Laterality: N/A;   TUBAL LIGATION      Social History Social History   Tobacco Use   Smoking status: Former   Smokeless tobacco: Never  Substance Use Topics   Alcohol use: Not Currently    Comment: occas   Drug use: No    Family History Family History  Problem Relation Age of Onset   Arthritis Mother    Hypertension Mother    Colon polyps Mother    Heart disease Father    Hypertension Father    Diabetes Brother    Cancer Neg Hx    Breast cancer Neg Hx     Allergies  Allergen Reactions   Statins     Other reaction(s): Muscle Pain   Sulfa Antibiotics     headaches   Iodine Rash     REVIEW OF SYSTEMS (Negative unless checked)  Constitutional: [] Weight loss  [] Fever  [] Chills Cardiac: [] Chest pain   [] Chest pressure   [] Palpitations   [] Shortness of breath when laying flat   [] Shortness of breath with exertion. Vascular:  [x] Pain in legs with walking   []   Pain in legs at rest  [] History of DVT   [] Phlebitis   [] Swelling in legs   [] Varicose veins   [] Non-healing ulcers Pulmonary:   [] Uses home oxygen   [] Productive cough   [] Hemoptysis   [] Wheeze  [] COPD   [] Asthma Neurologic:  [] Dizziness   [] Seizures   [] History of stroke   [] History of TIA  [] Aphasia   [] Vissual changes   [] Weakness or numbness in arm   [] Weakness or numbness in leg Musculoskeletal:   [] Joint swelling   [] Joint pain   [] Low back pain Hematologic:  [] Easy bruising  [] Easy bleeding   [] Hypercoagulable state   [] Anemic Gastrointestinal:  [] Diarrhea   [] Vomiting  [] Gastroesophageal reflux/heartburn   [] Difficulty swallowing. Genitourinary:  [] Chronic kidney disease   [] Difficult urination  [] Frequent urination   [] Blood in urine Skin:  [] Rashes   [] Ulcers  Psychological:  [] History of anxiety   []   History of major depression.  Physical Examination  There were no vitals filed for this visit. There is no height or weight on file to calculate BMI. Gen: WD/WN, NAD Head: [place]/AT, No temporalis wasting.  Ear/Nose/Throat: Hearing grossly intact, nares w/o erythema or drainage Eyes: PER, EOMI, sclera nonicteric.  Neck: Supple, no masses.  No bruit or JVD.  Pulmonary:  Good air movement, no audible wheezing, no use of accessory muscles.  Cardiac: RRR, normal S1, S2, no Murmurs. Vascular:  mild trophic changes, no open wounds Vessel Right Left  Radial Palpable Palpable  PT Not Palpable Not Palpable  DP Not Palpable Not Palpable  Gastrointestinal: soft, non-distended. No guarding/no peritoneal signs.  Musculoskeletal: M/S 5/5 throughout.  No visible deformity.  Neurologic: CN 2-12 intact. Pain and light touch intact in extremities.  Symmetrical.  Speech is fluent. Motor exam as listed above. Psychiatric: Judgment intact, Mood & affect appropriate for pt's clinical situation. Dermatologic: No rashes or ulcers noted.  No changes consistent with cellulitis.   CBC Lab Results  Component Value Date   WBC 6.5 06/01/2023   HGB 12.0 06/01/2023   HCT 36.3 06/01/2023   MCV 92.8 06/01/2023   PLT 265.0 06/01/2023    BMET    Component Value Date/Time   NA 140 07/26/2023 0733   K 4.5 07/26/2023 0733   CL 103 07/26/2023 0733   CO2 25 07/26/2023 0733   GLUCOSE 143 (H) 07/26/2023 0733   BUN 28 (H) 07/26/2023 0733   CREATININE 1.31 (H) 07/26/2023 0733   CALCIUM 9.7 07/26/2023 0733   CrCl cannot be calculated (Patient's most recent lab result is older than the maximum 21 days allowed.).  COAG No results found for: "INR", "PROTIME"  Radiology MM 3D SCREENING MAMMOGRAM BILATERAL BREAST  Result Date: 09/08/2023 CLINICAL DATA:  Screening. EXAM: DIGITAL SCREENING BILATERAL MAMMOGRAM WITH TOMOSYNTHESIS AND CAD TECHNIQUE: Bilateral screening digital craniocaudal and mediolateral oblique  mammograms were obtained. Bilateral screening digital breast tomosynthesis was performed. The images were evaluated with computer-aided detection. COMPARISON:  Previous exam(s). ACR Breast Density Category b: There are scattered areas of fibroglandular density. FINDINGS: There are no findings suspicious for malignancy. IMPRESSION: No mammographic evidence of malignancy. A result letter of this screening mammogram will be mailed directly to the patient. RECOMMENDATION: Screening mammogram in one year. (Code:SM-B-01Y) BI-RADS CATEGORY  1: Negative. Electronically Signed   By: Amie Portland M.D.   On: 09/08/2023 11:47     Assessment/Plan 1. Atherosclerosis of native artery of both lower extremities with intermittent claudication (HCC) Recommend:   The patient has evidence of atherosclerosis of the  lower extremities with claudication.  The patient does not voice lifestyle limiting changes at this point in time.   Noninvasive studies do not suggest clinically significant change.   No invasive studies, angiography or surgery at this time The patient should continue walking and begin a more formal exercise program.  The patient should continue antiplatelet therapy and aggressive treatment of the lipid abnormalities   No changes in the patient's medications at this time   Continued surveillance is indicated as atherosclerosis is likely to progress with time.     The patient will continue follow up with noninvasive studies as ordered.   - VAS [place] ABI WITH/WO TBI; Future  2. Carotid artery disease, unspecified laterality, unspecified type (HCC) Recommend:   Given the patient's asymptomatic subcritical stenosis no further invasive testing or surgery at this time.   Duplex ultrasound shows 1-39% stenosis bilaterally.   Continue antiplatelet therapy as prescribed Continue management of CAD, HTN and Hyperlipidemia Healthy heart diet,  encouraged exercise at least 4 times per week Follow up in 5  years with duplex ultrasound and physical exam    3. Essential hypertension, benign Continue antihypertensive medications as already ordered, these medications have been reviewed and there are no changes at this time.  4. Type 2 diabetes mellitus with hyperglycemia, without long-term current use of insulin (HCC) Continue hypoglycemic medications as already ordered, these medications have been reviewed and there are no changes at this time.  Hgb A1C to be monitored as already arranged by primary service  5. Hypercholesterolemia Continue statin as ordered and reviewed, no changes at this time    Levora Dredge, MD  10/04/2023 8:22 AM

## 2023-10-12 LAB — VAS US ABI WITH/WO TBI
Left ABI: 0.86
Right ABI: 0.8

## 2023-10-17 DIAGNOSIS — J0140 Acute pansinusitis, unspecified: Secondary | ICD-10-CM | POA: Diagnosis not present

## 2023-10-17 DIAGNOSIS — I10 Essential (primary) hypertension: Secondary | ICD-10-CM | POA: Diagnosis not present

## 2023-10-17 DIAGNOSIS — R051 Acute cough: Secondary | ICD-10-CM | POA: Diagnosis not present

## 2023-10-17 DIAGNOSIS — Z6824 Body mass index (BMI) 24.0-24.9, adult: Secondary | ICD-10-CM | POA: Diagnosis not present

## 2023-10-22 ENCOUNTER — Other Ambulatory Visit: Payer: Self-pay | Admitting: Internal Medicine

## 2023-10-30 ENCOUNTER — Other Ambulatory Visit: Payer: Self-pay | Admitting: Internal Medicine

## 2023-11-08 ENCOUNTER — Other Ambulatory Visit: Payer: Self-pay | Admitting: Internal Medicine

## 2023-11-09 NOTE — Telephone Encounter (Signed)
 Rx dose changed.  Taking q day levothyroxine.

## 2023-11-28 NOTE — Progress Notes (Unsigned)
Subjective:    Patient ID: Mariah Moody, female    DOB: 14-Jun-1941, 83 y.o.   MRN: [id number]  Patient here for No chief complaint on file.   HPI Here for a scheduled follow up - follow up regarding hypercholesterolemia, diabetes and hypertension. Continues on olmesartan, spironolactone and amlodipine. Saw nephrology 08/04/23 - jardiance added. Saw [facility] - carotid duplex 1-39% bilaterally. Recommended f/u in 5 years with duplex ultrasound and physical exam. Also recommended f/u ABIs - stable.    Past Medical History:  Diagnosis Date   Anemia    Coronary atherosclerosis of native coronary vessel    Diabetes mellitus without complication (HCC)    Fibroid    GERD (gastroesophageal reflux disease)    History of chicken pox    Hyperlipidemia    Hypertension    Hypothyroidism    Past Surgical History:  Procedure Laterality Date   COLONOSCOPY N/A 04/15/2015   Procedure: COLONOSCOPY;  Surgeon: Wallace Cullens, MD;  Location: [facility] ENDOSCOPY;  Service: Gastroenterology;  Laterality: N/A;   ESOPHAGOGASTRODUODENOSCOPY N/A 04/15/2015   Procedure: ESOPHAGOGASTRODUODENOSCOPY (EGD);  Surgeon: Wallace Cullens, MD;  Location: [facility] ENDOSCOPY;  Service: Gastroenterology;  Laterality: N/A;   TUBAL LIGATION     Family History  Problem Relation Age of Onset   Arthritis Mother    Hypertension Mother    Colon polyps Mother    Heart disease Father    Hypertension Father    Diabetes Brother    Cancer Neg Hx    Breast cancer Neg Hx    Social History   Socioeconomic History   Marital status: Married    Spouse name: Not on file   Number of children: Not on file   Years of education: Not on file   Highest education level: Not on file  Occupational History   Not on file  Tobacco Use   Smoking status: Former   Smokeless tobacco: Never  Substance and Sexual Activity   Alcohol use: Not Currently    Comment: occas   Drug use: No   Sexual activity: Not Currently  Other Topics Concern   Not on file   Social History Narrative   Not on file   Social Drivers of Health   Financial Resource Strain: Not on file  Food Insecurity: Not on file  Transportation Needs: Not on file  Physical Activity: Not on file  Stress: Not on file  Social Connections: Not on file     Review of Systems     Objective:     There were no vitals taken for this visit. Wt Readings from Last 3 Encounters:  10/04/23 138 lb (62.6 kg)  08/10/23 138 lb 8 oz (62.8 kg)  07/26/23 138 lb 6.4 oz (62.8 kg)    Physical Exam  {Perform Simple Foot Exam  Perform Detailed exam:1} {Insert foot Exam (Optional):30965}   Outpatient Encounter Medications as of 11/29/2023  Medication Sig   cyanocobalamin 1000 MCG tablet Take 1,000 mcg by mouth daily.   empagliflozin (JARDIANCE) 10 MG TABS tablet Take 10 mg by mouth in the morning.   estradiol-norethindrone (COMBIPATCH) 0.05-0.25 MG/DAY Place 1 patch onto the skin 2 (two) times a week.   hydrochlorothiazide (MICROZIDE) 12.5 MG capsule TAKE 1 CAPSULE BY MOUTH EVERY DAY   levothyroxine (SYNTHROID) 100 MCG tablet TAKE 1 TABLET BY MOUTH EVERY DAY   loratadine (CLARITIN) 10 MG tablet TAKE 1 TABLET BY MOUTH EVERY DAY AS NEEDED FOR ALLERGY   Multiple Vitamins-Minerals (MULTIVITAMIN PO) Take 1  tablet by mouth daily.    olmesartan (BENICAR) 40 MG tablet Take 1 tablet (40 mg total) by mouth daily.   pantoprazole (PROTONIX) 20 MG tablet TAKE 1 TABLET (20 MG TOTAL) BY MOUTH DAILY IN THE EVENING   pantoprazole (PROTONIX) 40 MG tablet TAKE 1 TABLET BY MOUTH EVERY DAY   spironolactone (ALDACTONE) 25 MG tablet Take 1 tablet (25 mg total) by mouth daily.   No facility-administered encounter medications on file as of 11/29/2023.     Lab Results  Component Value Date   WBC 6.5 06/01/2023   HGB 12.0 06/01/2023   HCT 36.3 06/01/2023   PLT 265.0 06/01/2023   GLUCOSE 143 (H) 07/26/2023   CHOL 214 (H) 07/26/2023   TRIG 391.0 (H) 07/26/2023   HDL 31.10 (L) 07/26/2023   LDLDIRECT 99.0  04/06/2023   LDLCALC 104 (H) 07/26/2023   ALT 12 07/26/2023   AST 15 07/26/2023   NA 140 07/26/2023   K 4.5 07/26/2023   CL 103 07/26/2023   CREATININE 1.31 (H) 07/26/2023   BUN 28 (H) 07/26/2023   CO2 25 07/26/2023   TSH 1.55 09/06/2023   HGBA1C 7.1 (H) 07/26/2023   MICROALBUR 1.3 07/26/2023    MM 3D SCREENING MAMMOGRAM BILATERAL BREAST Result Date: 09/08/2023 CLINICAL DATA:  Screening. EXAM: DIGITAL SCREENING BILATERAL MAMMOGRAM WITH TOMOSYNTHESIS AND CAD TECHNIQUE: Bilateral screening digital craniocaudal and mediolateral oblique mammograms were obtained. Bilateral screening digital breast tomosynthesis was performed. The images were evaluated with computer-aided detection. COMPARISON:  Previous exam(s). ACR Breast Density Category b: There are scattered areas of fibroglandular density. FINDINGS: There are no findings suspicious for malignancy. IMPRESSION: No mammographic evidence of malignancy. A result letter of this screening mammogram will be mailed directly to the patient. RECOMMENDATION: Screening mammogram in one year. (Code:SM-B-01Y) BI-RADS CATEGORY  1: Negative. Electronically Signed   By: Amie Portland M.D.   On: 09/08/2023 11:47       Assessment & Plan:  There are no diagnoses linked to this encounter.   [place], MD

## 2023-11-29 ENCOUNTER — Ambulatory Visit (INDEPENDENT_AMBULATORY_CARE_PROVIDER_SITE_OTHER): Payer: PPO | Admitting: Internal Medicine

## 2023-11-29 ENCOUNTER — Encounter: Payer: Self-pay | Admitting: Internal Medicine

## 2023-11-29 VITALS — BP 148/90 | HR 75 | Temp 97.9°F | Resp 16 | Ht 62.0 in | Wt 134.0 lb

## 2023-11-29 DIAGNOSIS — I70213 Atherosclerosis of native arteries of extremities with intermittent claudication, bilateral legs: Secondary | ICD-10-CM | POA: Diagnosis not present

## 2023-11-29 DIAGNOSIS — N1832 Chronic kidney disease, stage 3b: Secondary | ICD-10-CM

## 2023-11-29 DIAGNOSIS — K219 Gastro-esophageal reflux disease without esophagitis: Secondary | ICD-10-CM

## 2023-11-29 DIAGNOSIS — E1165 Type 2 diabetes mellitus with hyperglycemia: Secondary | ICD-10-CM

## 2023-11-29 DIAGNOSIS — E538 Deficiency of other specified B group vitamins: Secondary | ICD-10-CM | POA: Diagnosis not present

## 2023-11-29 DIAGNOSIS — R102 Pelvic and perineal pain: Secondary | ICD-10-CM | POA: Diagnosis not present

## 2023-11-29 DIAGNOSIS — Z23 Encounter for immunization: Secondary | ICD-10-CM | POA: Diagnosis not present

## 2023-11-29 DIAGNOSIS — R5383 Other fatigue: Secondary | ICD-10-CM | POA: Diagnosis not present

## 2023-11-29 DIAGNOSIS — E7800 Pure hypercholesterolemia, unspecified: Secondary | ICD-10-CM | POA: Diagnosis not present

## 2023-11-29 DIAGNOSIS — D649 Anemia, unspecified: Secondary | ICD-10-CM | POA: Diagnosis not present

## 2023-11-29 DIAGNOSIS — I10 Essential (primary) hypertension: Secondary | ICD-10-CM

## 2023-11-29 DIAGNOSIS — J3489 Other specified disorders of nose and nasal sinuses: Secondary | ICD-10-CM

## 2023-11-29 DIAGNOSIS — I779 Disorder of arteries and arterioles, unspecified: Secondary | ICD-10-CM | POA: Diagnosis not present

## 2023-11-29 DIAGNOSIS — E039 Hypothyroidism, unspecified: Secondary | ICD-10-CM

## 2023-11-29 DIAGNOSIS — I739 Peripheral vascular disease, unspecified: Secondary | ICD-10-CM

## 2023-11-29 LAB — BASIC METABOLIC PANEL
BUN: 26 mg/dL — ABNORMAL HIGH (ref 6–23)
CO2: 26 meq/L (ref 19–32)
Calcium: 10.2 mg/dL (ref 8.4–10.5)
Chloride: 105 meq/L (ref 96–112)
Creatinine, Ser: 1.2 mg/dL (ref 0.40–1.20)
GFR: 42.69 mL/min — ABNORMAL LOW (ref >60.00)
Glucose, Bld: 129 mg/dL — ABNORMAL HIGH (ref 70–99)
Potassium: 4 meq/L (ref 3.5–5.1)
Sodium: 139 meq/L (ref 135–145)

## 2023-11-29 LAB — LIPID PANEL
Cholesterol: 256 mg/dL — ABNORMAL HIGH (ref 0–200)
HDL: 36 mg/dL — ABNORMAL LOW (ref >39.00)
LDL Cholesterol: 152 mg/dL — ABNORMAL HIGH (ref 0–99)
NonHDL: 220.16
Total CHOL/HDL Ratio: 7
Triglycerides: 342 mg/dL — ABNORMAL HIGH (ref 0.0–149.0)
VLDL: 68.4 mg/dL — ABNORMAL HIGH (ref 0.0–40.0)

## 2023-11-29 LAB — HEPATIC FUNCTION PANEL
ALT: 10 U/L (ref 0–35)
AST: 16 U/L (ref 0–37)
Albumin: 4.5 g/dL (ref 3.5–5.2)
Alkaline Phosphatase: 88 U/L (ref 39–117)
Bilirubin, Direct: 0.1 mg/dL (ref 0.0–0.3)
Total Bilirubin: 0.7 mg/dL (ref 0.2–1.2)
Total Protein: 6.9 g/dL (ref 6.0–8.3)

## 2023-11-29 LAB — HEMOGLOBIN A1C: Hgb A1c MFr Bld: 7 % — ABNORMAL HIGH (ref 4.6–6.5)

## 2023-11-29 LAB — CBC WITH DIFFERENTIAL/PLATELET
Basophils Absolute: 0.1 10*3/uL (ref 0.0–0.1)
Basophils Relative: 0.9 % (ref 0.0–3.0)
Eosinophils Absolute: 0.2 10*3/uL (ref 0.0–0.7)
Eosinophils Relative: 3.2 % (ref 0.0–5.0)
HCT: 40.5 % (ref 36.0–46.0)
Hemoglobin: 13.5 g/dL (ref 12.0–15.0)
Lymphocytes Relative: 25.5 % (ref 12.0–46.0)
Lymphs Abs: 1.5 10*3/uL (ref 0.7–4.0)
MCHC: 33.2 g/dL (ref 30.0–36.0)
MCV: 94.3 fL (ref 78.0–100.0)
Monocytes Absolute: 0.5 10*3/uL (ref 0.1–1.0)
Monocytes Relative: 8.3 % (ref 3.0–12.0)
Neutro Abs: 3.7 10*3/uL (ref 1.4–7.7)
Neutrophils Relative %: 62.1 % (ref 43.0–77.0)
Platelets: 263 10*3/uL (ref 150.0–400.0)
RBC: 4.3 Mil/uL (ref 3.87–5.11)
RDW: 12.8 % (ref 11.5–15.5)
WBC: 5.9 10*3/uL (ref 4.0–10.5)

## 2023-11-29 LAB — URINALYSIS, ROUTINE W REFLEX MICROSCOPIC
Bilirubin Urine: NEGATIVE
Hgb urine dipstick: NEGATIVE
Ketones, ur: NEGATIVE
Leukocytes,Ua: NEGATIVE
Nitrite: NEGATIVE
RBC / HPF: NONE SEEN (ref 0–?)
Specific Gravity, Urine: 1.01 (ref 1.000–1.030)
Total Protein, Urine: NEGATIVE
Urine Glucose: >=1000 — AB
Urobilinogen, UA: 0.2 (ref 0.0–1.0)
pH: 6 (ref 5.0–8.0)

## 2023-11-29 LAB — IBC + FERRITIN
Ferritin: 29.6 ng/mL (ref 10.0–291.0)
Iron: 138 ug/dL (ref 42–145)
Saturation Ratios: 42.7 % (ref 20.0–50.0)
TIBC: 323.4 ug/dL (ref 250.0–450.0)
Transferrin: 231 mg/dL (ref 212.0–360.0)

## 2023-11-29 LAB — VITAMIN B12: Vitamin B-12: 855 pg/mL (ref 211–911)

## 2023-11-29 NOTE — Assessment & Plan Note (Signed)
Controlled on protonix.  Follow.

## 2023-11-29 NOTE — Assessment & Plan Note (Signed)
F/u [facility] - 10/2023 - Duplex ultrasound shows 1-39% stenosis bilaterally. Follow up in 5 years with duplex ultrasound and physical exam

## 2023-11-29 NOTE — Assessment & Plan Note (Signed)
Saw [facility] - carotid duplex 1-39% bilaterally. Recommended f/u in 5 years with duplex ultrasound and physical exam. Also recommended f/u ABIs - stable. Last seen 10/2023.

## 2023-11-29 NOTE — Assessment & Plan Note (Signed)
Reports some fatigue. Notices more on the weekend. Check cbc, metabolic panel, iron studies and B12.  Also discussed sleep apnea - given history of snoring, blood pressure issues. She declines at this time. Will notify me if desires sleep study.

## 2023-11-29 NOTE — Assessment & Plan Note (Signed)
Taking oral B12. Recheck b12 level today.

## 2023-11-29 NOTE — Assessment & Plan Note (Signed)
Saw [facility].  ABIs.  Stable.  Continue f/u with [facility].

## 2023-11-29 NOTE — Assessment & Plan Note (Signed)
Saline nasal spray and steroid nasal spray as directed.  Follow.

## 2023-11-29 NOTE — Assessment & Plan Note (Signed)
Low carb diet and exercise.  Stays active.  Follow metabolic panel and a1c.

## 2023-11-29 NOTE — Assessment & Plan Note (Signed)
On thyroid replacement.  Follow tsh.

## 2023-11-29 NOTE — Assessment & Plan Note (Signed)
Noted on previous check through nephrology. Recheck cbc today.  Also check b12 and iron studies.

## 2023-11-29 NOTE — Assessment & Plan Note (Signed)
Has declined cholesterol medication.  Continue low cholesterol diet and exercise.  Follow lipid panel.

## 2023-11-29 NOTE — Assessment & Plan Note (Signed)
Blood pressure as outlined.  Continue olmesartan. Had problems with amlodipine.  Intolerance to hydralazine.  On spironolactone 25mg  q day.  Also on jardiance. Follow pressures.  Follow metabolic panel. Hold on making changes. Lower pressures outside of office.  Follow.

## 2023-11-29 NOTE — Assessment & Plan Note (Signed)
Avoid antiinflammatories.  Stay hydrated.  Follow metabolic panel. On jardiance.  Blood pressures as outlined.  Follow pressures.  Follow metabolic panel.

## 2023-11-30 LAB — URINE CULTURE
MICRO NUMBER:: 16001692
SPECIMEN QUALITY:: ADEQUATE

## 2023-12-27 DIAGNOSIS — D492 Neoplasm of unspecified behavior of bone, soft tissue, and skin: Secondary | ICD-10-CM | POA: Diagnosis not present

## 2024-01-10 DIAGNOSIS — H43813 Vitreous degeneration, bilateral: Secondary | ICD-10-CM | POA: Diagnosis not present

## 2024-01-10 DIAGNOSIS — H2513 Age-related nuclear cataract, bilateral: Secondary | ICD-10-CM | POA: Diagnosis not present

## 2024-01-10 DIAGNOSIS — H348322 Tributary (branch) retinal vein occlusion, left eye, stable: Secondary | ICD-10-CM | POA: Diagnosis not present

## 2024-01-10 DIAGNOSIS — E119 Type 2 diabetes mellitus without complications: Secondary | ICD-10-CM | POA: Diagnosis not present

## 2024-01-10 LAB — HM DIABETES EYE EXAM

## 2024-02-01 DIAGNOSIS — D631 Anemia in chronic kidney disease: Secondary | ICD-10-CM | POA: Diagnosis not present

## 2024-02-01 DIAGNOSIS — N1832 Chronic kidney disease, stage 3b: Secondary | ICD-10-CM | POA: Diagnosis not present

## 2024-02-01 DIAGNOSIS — E1122 Type 2 diabetes mellitus with diabetic chronic kidney disease: Secondary | ICD-10-CM | POA: Diagnosis not present

## 2024-02-01 DIAGNOSIS — I10 Essential (primary) hypertension: Secondary | ICD-10-CM | POA: Diagnosis not present

## 2024-02-02 ENCOUNTER — Telehealth: Payer: Self-pay | Admitting: Internal Medicine

## 2024-02-02 NOTE — Telephone Encounter (Signed)
 Copied from CRM [id number]. Topic: Clinical - Medication Question >> Feb 02, 2024  4:09 PM [place] J wrote: Reason for CRM: Patient was wondering if she could get a physical copy of a prescription order that she can pick up in clinic so she can fax her information to a pharmacy in [place] that she will receive medication from. Medication name is: estradiol-norethindrone (COMBIPATCH) 0.05-0.25 MG/DAY

## 2024-02-03 MED ORDER — COMBIPATCH 0.05-0.25 MG/DAY TD PTTW: 1.0000 | MEDICATED_PATCH | TRANSDERMAL | 1 refills | Status: AC

## 2024-02-03 NOTE — Telephone Encounter (Signed)
 Ok to print rx for her combipatch?

## 2024-02-03 NOTE — Telephone Encounter (Signed)
Ok to print prescription?

## 2024-02-03 NOTE — Telephone Encounter (Signed)
 Printed

## 2024-02-03 NOTE — Addendum Note (Signed)
 Addended by: Rita Ohara D on: 02/03/2024 12:48 PM   Modules accepted: Orders

## 2024-02-03 NOTE — Telephone Encounter (Signed)
 LM for patient. Rx placed up front for pick up

## 2024-02-08 ENCOUNTER — Encounter: Payer: Self-pay | Admitting: Internal Medicine

## 2024-02-17 ENCOUNTER — Other Ambulatory Visit: Payer: Self-pay | Admitting: Internal Medicine

## 2024-03-12 ENCOUNTER — Other Ambulatory Visit: Payer: Self-pay | Admitting: Internal Medicine

## 2024-03-21 ENCOUNTER — Encounter (INDEPENDENT_AMBULATORY_CARE_PROVIDER_SITE_OTHER): Payer: Self-pay

## 2024-03-28 ENCOUNTER — Ambulatory Visit (INDEPENDENT_AMBULATORY_CARE_PROVIDER_SITE_OTHER): Payer: PPO | Admitting: Internal Medicine

## 2024-03-28 ENCOUNTER — Encounter: Payer: Self-pay | Admitting: Internal Medicine

## 2024-03-28 VITALS — BP 128/74 | HR 74 | Temp 98.3°F | Resp 16 | Ht 62.0 in | Wt 138.2 lb

## 2024-03-28 DIAGNOSIS — I70213 Atherosclerosis of native arteries of extremities with intermittent claudication, bilateral legs: Secondary | ICD-10-CM

## 2024-03-28 DIAGNOSIS — E1165 Type 2 diabetes mellitus with hyperglycemia: Secondary | ICD-10-CM

## 2024-03-28 DIAGNOSIS — E7800 Pure hypercholesterolemia, unspecified: Secondary | ICD-10-CM

## 2024-03-28 DIAGNOSIS — D649 Anemia, unspecified: Secondary | ICD-10-CM | POA: Diagnosis not present

## 2024-03-28 DIAGNOSIS — E039 Hypothyroidism, unspecified: Secondary | ICD-10-CM | POA: Diagnosis not present

## 2024-03-28 DIAGNOSIS — K219 Gastro-esophageal reflux disease without esophagitis: Secondary | ICD-10-CM | POA: Diagnosis not present

## 2024-03-28 DIAGNOSIS — Z7984 Long term (current) use of oral hypoglycemic drugs: Secondary | ICD-10-CM

## 2024-03-28 DIAGNOSIS — I10 Essential (primary) hypertension: Secondary | ICD-10-CM

## 2024-03-28 DIAGNOSIS — I779 Disorder of arteries and arterioles, unspecified: Secondary | ICD-10-CM

## 2024-03-28 DIAGNOSIS — N1832 Chronic kidney disease, stage 3b: Secondary | ICD-10-CM | POA: Diagnosis not present

## 2024-03-28 DIAGNOSIS — I739 Peripheral vascular disease, unspecified: Secondary | ICD-10-CM

## 2024-03-28 LAB — LIPID PANEL
Cholesterol: 268 mg/dL — ABNORMAL HIGH (ref 0–200)
HDL: 31.7 mg/dL — ABNORMAL LOW (ref >39.00)
NonHDL: 236.78
Total CHOL/HDL Ratio: 8
Triglycerides: 481 mg/dL — ABNORMAL HIGH (ref 0.0–149.0)
VLDL: 96.2 mg/dL — ABNORMAL HIGH (ref 0.0–40.0)

## 2024-03-28 LAB — BASIC METABOLIC PANEL WITH GFR
BUN: 31 mg/dL — ABNORMAL HIGH (ref 6–23)
CO2: 24 meq/L (ref 19–32)
Calcium: 9.7 mg/dL (ref 8.4–10.5)
Chloride: 106 meq/L (ref 96–112)
Creatinine, Ser: 1.45 mg/dL — ABNORMAL HIGH (ref 0.40–1.20)
GFR: 33.94 mL/min — ABNORMAL LOW (ref >60.00)
Glucose, Bld: 139 mg/dL — ABNORMAL HIGH (ref 70–99)
Potassium: 4.3 meq/L (ref 3.5–5.1)
Sodium: 139 meq/L (ref 135–145)

## 2024-03-28 LAB — HEPATIC FUNCTION PANEL
ALT: 11 U/L (ref 0–35)
AST: 14 U/L (ref 0–37)
Albumin: 4.4 g/dL (ref 3.5–5.2)
Alkaline Phosphatase: 82 U/L (ref 39–117)
Bilirubin, Direct: 0.1 mg/dL (ref 0.0–0.3)
Total Bilirubin: 0.7 mg/dL (ref 0.2–1.2)
Total Protein: 6.9 g/dL (ref 6.0–8.3)

## 2024-03-28 LAB — HEMOGLOBIN A1C: Hgb A1c MFr Bld: 6.9 % — ABNORMAL HIGH (ref 4.6–6.5)

## 2024-03-28 LAB — LDL CHOLESTEROL, DIRECT: Direct LDL: 128 mg/dL

## 2024-03-28 NOTE — Assessment & Plan Note (Addendum)
 Blood pressure as outlined.  Continue olmesartan . Had problems with amlodipine .  Intolerance to hydralazine .  On spironolactone  25mg  q day.  Also on jardiance. Follow pressures.  States pressures outside readings - 120-130s systolic. No changes in medication today. Check metabolic panel.

## 2024-03-28 NOTE — Assessment & Plan Note (Signed)
 Has declined cholesterol medication.  Continue low cholesterol diet and exercise.  Check lipid panel today.

## 2024-03-28 NOTE — Assessment & Plan Note (Signed)
 Avoid antiinflammatories.  Stay hydrated.  Follow metabolic panel. On jardiance.  Blood pressures as outlined.  Last saw nephrology 02/01/24 - GFR 40 - stable. Check metabolic panel today.

## 2024-03-28 NOTE — Assessment & Plan Note (Signed)
 F/u [facility] - 10/2023 - Duplex ultrasound shows 1-39% stenosis bilaterally. Follow up in 5 years with duplex ultrasound and physical exam

## 2024-03-28 NOTE — Assessment & Plan Note (Signed)
 Saw [facility] - carotid duplex 1-39% bilaterally. Recommended f/u in 5 years with duplex ultrasound and physical exam. Also recommended f/u ABIs - stable. Last seen 10/2023. Recommended f/u one year

## 2024-03-28 NOTE — Assessment & Plan Note (Addendum)
 Seeing [facility] - last 10/2023 - ABIs stable. Recommended f/u appt one year. Continue blood pressure control. Has declined cholesterol medication.

## 2024-03-28 NOTE — Assessment & Plan Note (Signed)
 On thyroid replacement.  Follow tsh.

## 2024-03-28 NOTE — Assessment & Plan Note (Signed)
 No upper symptoms reported.  Continue protonix.

## 2024-03-28 NOTE — Assessment & Plan Note (Signed)
 Noted on previous check through nephrology. CBC check through nephrology 02/01/24 - hgb 13. Recent iron studies and B12 wnl.

## 2024-03-28 NOTE — Progress Notes (Signed)
 Subjective:    Patient ID: Mariah Moody, female    DOB: 1941-03-06, 83 y.o.   MRN: [id number]  Patient here for No chief complaint on file.   HPI Here for a scheduled follow up - follow up regarding diabetes, hypercholesterolemia and hypertension. Continues on olmesartan  and spironolactone .  Also taking jardiance. Seeing nephrology. F/u 02/01/24. Stable. GFR 40.  Saw [facility] (last 10/04/23)- carotid duplex 1-39% bilaterally. Recommended f/u in 5 years with duplex ultrasound and physical exam. Also recommended f/u ABIs - stable. Recommended f/u appt one year. She reports she is doing well. Feels good. Stays busy - working. No chest pain or sob reported. No abdominal pain or bowel change reported.    Past Medical History:  Diagnosis Date   Anemia    Coronary atherosclerosis of native coronary vessel    Diabetes mellitus without complication (HCC)    Fibroid    GERD (gastroesophageal reflux disease)    History of chicken pox    Hyperlipidemia    Hypertension    Hypothyroidism    Past Surgical History:  Procedure Laterality Date   COLONOSCOPY N/A 04/15/2015   Procedure: COLONOSCOPY;  Surgeon: Stephens Eis, MD;  Location: [facility] ENDOSCOPY;  Service: Gastroenterology;  Laterality: N/A;   ESOPHAGOGASTRODUODENOSCOPY N/A 04/15/2015   Procedure: ESOPHAGOGASTRODUODENOSCOPY (EGD);  Surgeon: Stephens Eis, MD;  Location: [facility] ENDOSCOPY;  Service: Gastroenterology;  Laterality: N/A;   TUBAL LIGATION     Family History  Problem Relation Age of Onset   Arthritis Mother    Hypertension Mother    Colon polyps Mother    Heart disease Father    Hypertension Father    Diabetes Brother    Cancer Neg Hx    Breast cancer Neg Hx    Social History   Socioeconomic History   Marital status: Married    Spouse name: Not on file   Number of children: Not on file   Years of education: Not on file   Highest education level: Not on file  Occupational History   Not on file  Tobacco Use   Smoking status:  Former   Smokeless tobacco: Never  Substance and Sexual Activity   Alcohol use: Not Currently    Comment: occas   Drug use: No   Sexual activity: Not Currently  Other Topics Concern   Not on file  Social History Narrative   Not on file   Social Drivers of Health   Financial Resource Strain: Not on file  Food Insecurity: Not on file  Transportation Needs: Not on file  Physical Activity: Not on file  Stress: Not on file  Social Connections: Not on file     Review of Systems  Constitutional:  Negative for appetite change and unexpected weight change.  HENT:  Negative for congestion and sinus pressure.   Respiratory:  Negative for cough, chest tightness and shortness of breath.   Cardiovascular:  Negative for chest pain, palpitations and leg swelling.  Gastrointestinal:  Negative for abdominal pain, diarrhea, nausea and vomiting.  Genitourinary:  Negative for difficulty urinating and dysuria.  Musculoskeletal:  Negative for joint swelling and myalgias.  Skin:  Negative for color change and rash.  Neurological:  Negative for dizziness and headaches.  Psychiatric/Behavioral:  Negative for agitation and dysphoric mood.        Objective:     BP 128/74   Pulse 74   Temp 98.3 F (36.8 C)   Resp 16   Ht 5\' 2"  (1.575 m)  Wt 138 lb 3.2 oz (62.7 kg)   SpO2 99%   BMI 25.28 kg/m  Wt Readings from Last 3 Encounters:  03/28/24 138 lb 3.2 oz (62.7 kg)  11/29/23 134 lb (60.8 kg)  10/04/23 138 lb (62.6 kg)    Physical Exam Vitals reviewed.  Constitutional:      General: She is not in acute distress.    Appearance: Normal appearance.  HENT:     Head: Normocephalic and atraumatic.     Right Ear: External ear normal.     Left Ear: External ear normal.     Mouth/Throat:     Pharynx: No oropharyngeal exudate or posterior oropharyngeal erythema.  Eyes:     General: No scleral icterus.       Right eye: No discharge.        Left eye: No discharge.     Conjunctiva/sclera:  Conjunctivae normal.  Neck:     Thyroid : No thyromegaly.  Cardiovascular:     Rate and Rhythm: Normal rate and regular rhythm.  Pulmonary:     Effort: No respiratory distress.     Breath sounds: Normal breath sounds. No wheezing.  Abdominal:     General: Bowel sounds are normal.     Palpations: Abdomen is soft.     Tenderness: There is no abdominal tenderness.  Musculoskeletal:        General: No swelling or tenderness.     Cervical back: Neck supple. No tenderness.  Lymphadenopathy:     Cervical: No cervical adenopathy.  Skin:    Findings: No erythema or rash.  Neurological:     Mental Status: She is alert.  Psychiatric:        Mood and Affect: Mood normal.        Behavior: Behavior normal.         Outpatient Encounter Medications as of 03/28/2024  Medication Sig   cyanocobalamin  1000 MCG tablet Take 1,000 mcg by mouth daily.   empagliflozin (JARDIANCE) 10 MG TABS tablet Take 10 mg by mouth in the morning.   estradiol -norethindrone  (COMBIPATCH ) 0.05-0.25 MG/DAY Place 1 patch onto the skin 2 (two) times a week.   levothyroxine  (SYNTHROID ) 100 MCG tablet TAKE 1 TABLET BY MOUTH EVERY DAY   loratadine  (CLARITIN ) 10 MG tablet TAKE 1 TABLET BY MOUTH EVERY DAY AS NEEDED FOR ALLERGY   Multiple Vitamins-Minerals (MULTIVITAMIN PO) Take 1 tablet by mouth daily.    olmesartan  (BENICAR ) 40 MG tablet TAKE 1 TABLET BY MOUTH EVERY DAY   pantoprazole  (PROTONIX ) 20 MG tablet TAKE 1 TABLET (20 MG TOTAL) BY MOUTH DAILY IN THE EVENING   pantoprazole  (PROTONIX ) 40 MG tablet TAKE 1 TABLET BY MOUTH EVERY DAY   spironolactone  (ALDACTONE ) 25 MG tablet TAKE 1 TABLET (25 MG TOTAL) BY MOUTH DAILY.   No facility-administered encounter medications on file as of 03/28/2024.     Lab Results  Component Value Date   WBC 5.9 11/29/2023   HGB 13.5 11/29/2023   HCT 40.5 11/29/2023   PLT 263.0 11/29/2023   GLUCOSE 129 (H) 11/29/2023   CHOL 256 (H) 11/29/2023   TRIG 342.0 (H) 11/29/2023   HDL 36.00 (L)  11/29/2023   LDLDIRECT 99.0 04/06/2023   LDLCALC 152 (H) 11/29/2023   ALT 10 11/29/2023   AST 16 11/29/2023   NA 139 11/29/2023   K 4.0 11/29/2023   CL 105 11/29/2023   CREATININE 1.20 11/29/2023   BUN 26 (H) 11/29/2023   CO2 26 11/29/2023   TSH 1.55 09/06/2023  HGBA1C 7.0 (H) 11/29/2023   MICROALBUR 1.3 07/26/2023    MM 3D SCREENING MAMMOGRAM BILATERAL BREAST Result Date: 09/08/2023 CLINICAL DATA:  Screening. EXAM: DIGITAL SCREENING BILATERAL MAMMOGRAM WITH TOMOSYNTHESIS AND CAD TECHNIQUE: Bilateral screening digital craniocaudal and mediolateral oblique mammograms were obtained. Bilateral screening digital breast tomosynthesis was performed. The images were evaluated with computer-aided detection. COMPARISON:  Previous exam(s). ACR Breast Density Category b: There are scattered areas of fibroglandular density. FINDINGS: There are no findings suspicious for malignancy. IMPRESSION: No mammographic evidence of malignancy. A result letter of this screening mammogram will be mailed directly to the patient. RECOMMENDATION: Screening mammogram in one year. (Code:SM-B-01Y) BI-RADS CATEGORY  1: Negative. Electronically Signed   By: Amanda Jungling M.D.   On: 09/08/2023 11:47       Assessment & Plan:  Stage 3b chronic kidney disease (HCC) Assessment & Plan: Avoid antiinflammatories.  Stay hydrated.  Follow metabolic panel. On jardiance.  Blood pressures as outlined.  Last saw nephrology 02/01/24 - GFR 40 - stable. Check metabolic panel today.   Orders: -     Basic metabolic panel with GFR  Anemia, unspecified type Assessment & Plan: Noted on previous check through nephrology. CBC check through nephrology 02/01/24 - hgb 13. Recent iron studies and B12 wnl.    Atherosclerosis of native artery of both lower extremities with intermittent claudication [facility]) Assessment & Plan: Seeing [facility] - last 10/2023 - ABIs stable. Recommended f/u appt one year. Continue blood pressure control. Has declined  cholesterol medication.    Carotid artery disease, unspecified laterality, unspecified type [facility]) Assessment & Plan: F/u [facility] - 10/2023 - Duplex ultrasound shows 1-39% stenosis bilaterally. Follow up in 5 years with duplex ultrasound and physical exam.    Essential hypertension, benign Assessment & Plan: Blood pressure as outlined.  Continue olmesartan . Had problems with amlodipine .  Intolerance to hydralazine .  On spironolactone  25mg  q day.  Also on jardiance. Follow pressures.  States pressures outside readings - 120-130s systolic. No changes in medication today. Check metabolic panel.    Type 2 diabetes mellitus with hyperglycemia, without long-term current use of insulin (HCC) Assessment & Plan: Low carb diet and exercise.  Stays active.  Check metabolic panel and A1c today.   Orders: -     Hemoglobin A1c  Hypothyroidism, unspecified type Assessment & Plan: On thyroid  replacement.  Follow tsh.    Hypercholesterolemia Assessment & Plan: Has declined cholesterol medication.  Continue low cholesterol diet and exercise.  Check lipid panel today.   Orders: -     Hepatic function panel -     Lipid panel  Gastroesophageal reflux disease, unspecified whether esophagitis present Assessment & Plan: No upper symptoms reported. Continue protonix .    PAD (peripheral artery disease) (HCC) Assessment & Plan: Saw [facility] - carotid duplex 1-39% bilaterally. Recommended f/u in 5 years with duplex ultrasound and physical exam. Also recommended f/u ABIs - stable. Last seen 10/2023. Recommended f/u one year      Dellar Fenton, MD

## 2024-03-28 NOTE — Assessment & Plan Note (Signed)
 Low carb diet and exercise.  Stays active.  Check metabolic panel and A1c today.

## 2024-03-29 ENCOUNTER — Ambulatory Visit: Payer: Self-pay | Admitting: Internal Medicine

## 2024-03-30 ENCOUNTER — Other Ambulatory Visit: Payer: Self-pay

## 2024-03-30 DIAGNOSIS — E7800 Pure hypercholesterolemia, unspecified: Secondary | ICD-10-CM

## 2024-03-30 DIAGNOSIS — R944 Abnormal results of kidney function studies: Secondary | ICD-10-CM

## 2024-04-25 ENCOUNTER — Other Ambulatory Visit: Payer: Self-pay | Admitting: Internal Medicine

## 2024-04-28 ENCOUNTER — Other Ambulatory Visit (INDEPENDENT_AMBULATORY_CARE_PROVIDER_SITE_OTHER)

## 2024-04-28 ENCOUNTER — Ambulatory Visit: Payer: Self-pay | Admitting: Internal Medicine

## 2024-04-28 DIAGNOSIS — E7800 Pure hypercholesterolemia, unspecified: Secondary | ICD-10-CM | POA: Diagnosis not present

## 2024-04-28 DIAGNOSIS — R944 Abnormal results of kidney function studies: Secondary | ICD-10-CM | POA: Diagnosis not present

## 2024-04-28 LAB — BASIC METABOLIC PANEL WITH GFR
BUN: 23 mg/dL (ref 6–23)
CO2: 27 meq/L (ref 19–32)
Calcium: 9.9 mg/dL (ref 8.4–10.5)
Chloride: 104 meq/L (ref 96–112)
Creatinine, Ser: 1.27 mg/dL — ABNORMAL HIGH (ref 0.40–1.20)
GFR: 39.77 mL/min — ABNORMAL LOW (ref >60.00)
Glucose, Bld: 133 mg/dL — ABNORMAL HIGH (ref 70–99)
Potassium: 4.3 meq/L (ref 3.5–5.1)
Sodium: 139 meq/L (ref 135–145)

## 2024-06-15 DIAGNOSIS — D631 Anemia in chronic kidney disease: Secondary | ICD-10-CM | POA: Diagnosis not present

## 2024-06-15 DIAGNOSIS — I10 Essential (primary) hypertension: Secondary | ICD-10-CM | POA: Diagnosis not present

## 2024-06-15 DIAGNOSIS — N1832 Chronic kidney disease, stage 3b: Secondary | ICD-10-CM | POA: Diagnosis not present

## 2024-06-15 DIAGNOSIS — E1122 Type 2 diabetes mellitus with diabetic chronic kidney disease: Secondary | ICD-10-CM | POA: Diagnosis not present

## 2024-08-07 ENCOUNTER — Ambulatory Visit: Admitting: Internal Medicine

## 2024-08-07 VITALS — BP 126/72 | HR 70 | Resp 16 | Ht 62.0 in | Wt 135.4 lb

## 2024-08-07 DIAGNOSIS — E039 Hypothyroidism, unspecified: Secondary | ICD-10-CM | POA: Diagnosis not present

## 2024-08-07 DIAGNOSIS — E1165 Type 2 diabetes mellitus with hyperglycemia: Secondary | ICD-10-CM | POA: Diagnosis not present

## 2024-08-07 DIAGNOSIS — I70213 Atherosclerosis of native arteries of extremities with intermittent claudication, bilateral legs: Secondary | ICD-10-CM

## 2024-08-07 DIAGNOSIS — I779 Disorder of arteries and arterioles, unspecified: Secondary | ICD-10-CM

## 2024-08-07 DIAGNOSIS — K219 Gastro-esophageal reflux disease without esophagitis: Secondary | ICD-10-CM

## 2024-08-07 DIAGNOSIS — Z0000 Encounter for general adult medical examination without abnormal findings: Secondary | ICD-10-CM | POA: Diagnosis not present

## 2024-08-07 DIAGNOSIS — N1832 Chronic kidney disease, stage 3b: Secondary | ICD-10-CM

## 2024-08-07 DIAGNOSIS — Z23 Encounter for immunization: Secondary | ICD-10-CM | POA: Diagnosis not present

## 2024-08-07 DIAGNOSIS — E2839 Other primary ovarian failure: Secondary | ICD-10-CM

## 2024-08-07 DIAGNOSIS — I10 Essential (primary) hypertension: Secondary | ICD-10-CM

## 2024-08-07 DIAGNOSIS — E7800 Pure hypercholesterolemia, unspecified: Secondary | ICD-10-CM

## 2024-08-07 DIAGNOSIS — R9389 Abnormal findings on diagnostic imaging of other specified body structures: Secondary | ICD-10-CM

## 2024-08-07 DIAGNOSIS — Z1231 Encounter for screening mammogram for malignant neoplasm of breast: Secondary | ICD-10-CM

## 2024-08-07 LAB — MICROALBUMIN / CREATININE URINE RATIO
Creatinine,U: 45.7 mg/dL
Microalb Creat Ratio: 32.7 mg/g — ABNORMAL HIGH (ref 0.0–30.0)
Microalb, Ur: 1.5 mg/dL (ref 0.0–1.9)

## 2024-08-07 LAB — LIPID PANEL
Cholesterol: 222 mg/dL — ABNORMAL HIGH (ref 0–200)
HDL: 33.3 mg/dL — ABNORMAL LOW (ref >39.00)
LDL Cholesterol: 124 mg/dL — ABNORMAL HIGH (ref 0–99)
NonHDL: 188.68
Total CHOL/HDL Ratio: 7
Triglycerides: 325 mg/dL — ABNORMAL HIGH (ref 0.0–149.0)
VLDL: 65 mg/dL — ABNORMAL HIGH (ref 0.0–40.0)

## 2024-08-07 LAB — HEPATIC FUNCTION PANEL
ALT: 9 U/L (ref 0–35)
AST: 14 U/L (ref 0–37)
Albumin: 4.5 g/dL (ref 3.5–5.2)
Alkaline Phosphatase: 81 U/L (ref 39–117)
Bilirubin, Direct: 0.1 mg/dL (ref 0.0–0.3)
Total Bilirubin: 0.6 mg/dL (ref 0.2–1.2)
Total Protein: 6.6 g/dL (ref 6.0–8.3)

## 2024-08-07 LAB — HM DIABETES FOOT EXAM

## 2024-08-07 LAB — BASIC METABOLIC PANEL WITH GFR
BUN: 22 mg/dL (ref 6–23)
CO2: 27 meq/L (ref 19–32)
Calcium: 10 mg/dL (ref 8.4–10.5)
Chloride: 105 meq/L (ref 96–112)
Creatinine, Ser: 1.15 mg/dL (ref 0.40–1.20)
GFR: 44.71 mL/min — ABNORMAL LOW (ref >60.00)
Glucose, Bld: 126 mg/dL — ABNORMAL HIGH (ref 70–99)
Potassium: 4.8 meq/L (ref 3.5–5.1)
Sodium: 143 meq/L (ref 135–145)

## 2024-08-07 LAB — HEMOGLOBIN A1C: Hgb A1c MFr Bld: 6.7 % — ABNORMAL HIGH (ref 4.6–6.5)

## 2024-08-07 LAB — TSH: TSH: 2.43 u[IU]/mL (ref 0.35–5.50)

## 2024-08-07 MED ORDER — PANTOPRAZOLE SODIUM 40 MG PO TBEC: 40.0000 mg | DELAYED_RELEASE_TABLET | Freq: Every day | ORAL | 1 refills | Status: AC

## 2024-08-07 MED ORDER — PANTOPRAZOLE SODIUM 20 MG PO TBEC: 20.0000 mg | DELAYED_RELEASE_TABLET | Freq: Every day | ORAL | 1 refills | Status: AC

## 2024-08-07 MED ORDER — LORATADINE 10 MG PO TABS: 10.0000 mg | ORAL_TABLET | Freq: Every day | ORAL | 1 refills | Status: AC | PRN

## 2024-08-07 MED ORDER — SPIRONOLACTONE 25 MG PO TABS: 25.0000 mg | ORAL_TABLET | Freq: Every day | ORAL | 1 refills | Status: AC

## 2024-08-07 MED ORDER — OLMESARTAN MEDOXOMIL 40 MG PO TABS: 40.0000 mg | ORAL_TABLET | Freq: Every day | ORAL | 1 refills | Status: AC

## 2024-08-07 MED ORDER — LEVOTHYROXINE SODIUM 100 MCG PO TABS: 100.0000 ug | ORAL_TABLET | Freq: Every day | ORAL | 1 refills | Status: AC

## 2024-08-07 NOTE — Progress Notes (Signed)
 Subjective:    Patient ID: Mariah Moody, female    DOB: 10-25-1941, 83 y.o.   MRN: [id number]  Patient here for  Chief Complaint  Patient presents with   Annual Exam    HPI Here for a physical exam. Continues on olmesartan  and spironolactone . Also taking jardiance. Seeing nephrology. Evaluated - 02/01/24. Labs through nephrology 06/15/24 - GFR 38. Saw [facility] (last 10/04/23)- carotid duplex 1-39% bilaterally. Recommended f/u in 5 years with duplex ultrasound and physical exam. Also recommended f/u ABIs - stable. Recommended f/u appt one year. Stays active. No chest pain or sob reported. No abdominal pain or bowel change reported. She reports some right arm pain. Mostly localized - right elbow and upper arm. She has tried heat and massage. Has taken tylennol prn. Discussed scheduling mammogram and bone density.     Past Medical History:  Diagnosis Date   Anemia    Coronary atherosclerosis of native coronary vessel    Diabetes mellitus without complication (HCC)    Fibroid    GERD (gastroesophageal reflux disease)    History of chicken pox    Hyperlipidemia    Hypertension    Hypothyroidism    Past Surgical History:  Procedure Laterality Date   COLONOSCOPY N/A 04/15/2015   Procedure: COLONOSCOPY;  Surgeon: Deward CINDERELLA Piedmont, MD;  Location: [facility] ENDOSCOPY;  Service: Gastroenterology;  Laterality: N/A;   ESOPHAGOGASTRODUODENOSCOPY N/A 04/15/2015   Procedure: ESOPHAGOGASTRODUODENOSCOPY (EGD);  Surgeon: Deward CINDERELLA Piedmont, MD;  Location: [facility] ENDOSCOPY;  Service: Gastroenterology;  Laterality: N/A;   TUBAL LIGATION     Family History  Problem Relation Age of Onset   Arthritis Mother    Hypertension Mother    Colon polyps Mother    Heart disease Father    Hypertension Father    Diabetes Brother    Cancer Neg Hx    Breast cancer Neg Hx    Social History   Socioeconomic History   Marital status: Married    Spouse name: Not on file   Number of children: Not on file   Years of education: Not  on file   Highest education level: Not on file  Occupational History   Not on file  Tobacco Use   Smoking status: Former   Smokeless tobacco: Never  Substance and Sexual Activity   Alcohol use: Not Currently    Comment: occas   Drug use: No   Sexual activity: Not Currently  Other Topics Concern   Not on file  Social History Narrative   Not on file   Social Drivers of Health   Financial Resource Strain: Not on file  Food Insecurity: Not on file  Transportation Needs: Not on file  Physical Activity: Not on file  Stress: Not on file  Social Connections: Not on file     Review of Systems  Constitutional:  Negative for appetite change and unexpected weight change.  HENT:  Negative for congestion, sinus pressure and sore throat.   Eyes:  Negative for pain and visual disturbance.  Respiratory:  Negative for cough, chest tightness and shortness of breath.   Cardiovascular:  Negative for chest pain, palpitations and leg swelling.  Gastrointestinal:  Negative for abdominal pain, constipation and diarrhea.  Genitourinary:  Negative for difficulty urinating and dysuria.  Musculoskeletal:  Negative for back pain and joint swelling.       Right arm pain- elbow - upper arm.   Skin:  Negative for color change and rash.  Neurological:  Negative for dizziness and  headaches.  Hematological:  Negative for adenopathy. Does not bruise/bleed easily.  Psychiatric/Behavioral:  Negative for decreased concentration and dysphoric mood.        Objective:     BP 126/72   Pulse 70   Resp 16   Ht 5' 2 (1.575 m)   Wt 135 lb 6.4 oz (61.4 kg)   SpO2 98%   BMI 24.76 kg/m  Wt Readings from Last 3 Encounters:  08/07/24 135 lb 6.4 oz (61.4 kg)  03/28/24 138 lb 3.2 oz (62.7 kg)  11/29/23 134 lb (60.8 kg)    Physical Exam Vitals reviewed.  Constitutional:      General: She is not in acute distress.    Appearance: Normal appearance. She is well-developed.  HENT:     Head: Normocephalic and  atraumatic.     Right Ear: External ear normal.     Left Ear: External ear normal.     Mouth/Throat:     Pharynx: No oropharyngeal exudate or posterior oropharyngeal erythema.  Eyes:     General: No scleral icterus.       Right eye: No discharge.        Left eye: No discharge.     Conjunctiva/sclera: Conjunctivae normal.  Neck:     Thyroid : No thyromegaly.  Cardiovascular:     Rate and Rhythm: Normal rate and regular rhythm.  Pulmonary:     Effort: No tachypnea, accessory muscle usage or respiratory distress.     Breath sounds: Normal breath sounds. No decreased breath sounds or wheezing.  Chest:  Breasts:    Right: No inverted nipple, mass, nipple discharge or tenderness (no axillary adenopathy).     Left: No inverted nipple, mass, nipple discharge or tenderness (no axilarry adenopathy).  Abdominal:     General: Bowel sounds are normal.     Palpations: Abdomen is soft.     Tenderness: There is no abdominal tenderness.  Musculoskeletal:        General: No swelling.     Cervical back: Neck supple.     Comments: Increase tenderness to palpation - just above elbow. Increased pain with rotation of right forearm.   Lymphadenopathy:     Cervical: No cervical adenopathy.  Skin:    Findings: No erythema or rash.  Neurological:     Mental Status: She is alert and oriented to person, place, and time.  Psychiatric:        Mood and Affect: Mood normal.        Behavior: Behavior normal.         Outpatient Encounter Medications as of 08/07/2024  Medication Sig   cyanocobalamin  1000 MCG tablet Take 1,000 mcg by mouth daily.   estradiol -norethindrone  (COMBIPATCH ) 0.05-0.25 MG/DAY Place 1 patch onto the skin 2 (two) times a week.   levothyroxine  (SYNTHROID ) 100 MCG tablet Take 1 tablet (100 mcg total) by mouth daily.   loratadine  (CLARITIN ) 10 MG tablet Take 1 tablet (10 mg total) by mouth daily as needed for allergies.   Multiple Vitamins-Minerals (MULTIVITAMIN PO) Take 1 tablet by  mouth daily.    olmesartan  (BENICAR ) 40 MG tablet Take 1 tablet (40 mg total) by mouth daily.   pantoprazole  (PROTONIX ) 20 MG tablet Take 1 tablet (20 mg total) by mouth daily.   pantoprazole  (PROTONIX ) 40 MG tablet Take 1 tablet (40 mg total) by mouth daily.   spironolactone  (ALDACTONE ) 25 MG tablet Take 1 tablet (25 mg total) by mouth daily.   [DISCONTINUED] levothyroxine  (SYNTHROID ) 100 MCG tablet TAKE  1 TABLET BY MOUTH EVERY DAY   [DISCONTINUED] loratadine  (CLARITIN ) 10 MG tablet TAKE 1 TABLET BY MOUTH EVERY DAY AS NEEDED FOR ALLERGY   [DISCONTINUED] olmesartan  (BENICAR ) 40 MG tablet TAKE 1 TABLET BY MOUTH EVERY DAY   [DISCONTINUED] pantoprazole  (PROTONIX ) 20 MG tablet TAKE 1 TABLET (20 MG TOTAL) BY MOUTH DAILY IN THE EVENING   [DISCONTINUED] pantoprazole  (PROTONIX ) 40 MG tablet TAKE 1 TABLET BY MOUTH EVERY DAY   [DISCONTINUED] spironolactone  (ALDACTONE ) 25 MG tablet TAKE 1 TABLET (25 MG TOTAL) BY MOUTH DAILY.   No facility-administered encounter medications on file as of 08/07/2024.     Lab Results  Component Value Date   WBC 5.9 11/29/2023   HGB 13.5 11/29/2023   HCT 40.5 11/29/2023   PLT 263.0 11/29/2023   GLUCOSE 126 (H) 08/07/2024   CHOL 222 (H) 08/07/2024   TRIG 325.0 (H) 08/07/2024   HDL 33.30 (L) 08/07/2024   LDLDIRECT 128.0 03/28/2024   LDLCALC 124 (H) 08/07/2024   ALT 9 08/07/2024   AST 14 08/07/2024   NA 143 08/07/2024   K 4.8 08/07/2024   CL 105 08/07/2024   CREATININE 1.15 08/07/2024   BUN 22 08/07/2024   CO2 27 08/07/2024   TSH 2.43 08/07/2024   HGBA1C 6.7 (H) 08/07/2024   MICROALBUR 1.5 08/07/2024    MM 3D SCREENING MAMMOGRAM BILATERAL BREAST Result Date: 09/08/2023 CLINICAL DATA:  Screening. EXAM: DIGITAL SCREENING BILATERAL MAMMOGRAM WITH TOMOSYNTHESIS AND CAD TECHNIQUE: Bilateral screening digital craniocaudal and mediolateral oblique mammograms were obtained. Bilateral screening digital breast tomosynthesis was performed. The images were evaluated with  computer-aided detection. COMPARISON:  Previous exam(s). ACR Breast Density Category b: There are scattered areas of fibroglandular density. FINDINGS: There are no findings suspicious for malignancy. IMPRESSION: No mammographic evidence of malignancy. A result letter of this screening mammogram will be mailed directly to the patient. RECOMMENDATION: Screening mammogram in one year. (Code:SM-B-01Y) BI-RADS CATEGORY  1: Negative. Electronically Signed   By: Alm Parkins M.D.   On: 09/08/2023 11:47       Assessment & Plan:  Routine general medical examination at a health care facility  Hypercholesterolemia Assessment & Plan: Has declined cholesterol medication.  Continue low cholesterol diet and exercise.  Follow lipid panel.   Orders: -     Lipid panel -     Hepatic function panel -     TSH  Stage 3b chronic kidney disease (HCC) Assessment & Plan:  Seeing nephrology. Evaluated - 02/01/24. Labs through nephrology 06/15/24 - GFR 38. Continue jardiance and olmesartan .   Orders: -     Basic metabolic panel with GFR  Type 2 diabetes mellitus with hyperglycemia, without long-term current use of insulin (HCC) Assessment & Plan: Low carb diet and exercise. Follow metabolic panel and A1c.  Lab Results  Component Value Date   HGBA1C 6.7 (H) 08/07/2024     Orders: -     Hemoglobin A1c -     Microalbumin / creatinine urine ratio  Health care maintenance Assessment & Plan: Physical today 08/07/24.  Mammogram 09/06/23 - Birads I.  Colonoscopy 2016 - per report - no f/u warranted. Schedule mammogram and bone density.    Encounter for screening mammogram for malignant neoplasm of breast -     3D Screening Mammogram, Left and Right; Future  Immunization due -     Flu vaccine HIGH DOSE PF(Fluzone Trivalent)  Estrogen deficiency -     DG Bone Density; Future  Thickened endometrium Assessment & Plan: Dr Janit (02/03/22) -  discussed endometrial biopsy.  See note.  States decided to hold.  Continue HRT.    Atherosclerosis of native artery of both lower extremities with intermittent claudication Assessment & Plan:  Saw [facility] (last 10/04/23)- carotid duplex 1-39% bilaterally. Recommended f/u in 5 years with duplex ultrasound and physical exam. Also recommended f/u ABIs - stable. Recommended f/u appt one year.   Carotid artery disease, unspecified laterality, unspecified type Assessment & Plan:  Saw [facility] (last 10/04/23)- carotid duplex 1-39% bilaterally. Recommended f/u in 5 years with duplex ultrasound and physical exam. Also recommended f/u ABIs - stable. Recommended f/u appt one year.   Essential hypertension, benign Assessment & Plan: Blood pressure as outlined.  Continue olmesartan . Had problems with amlodipine .  Intolerance to hydralazine .  On spironolactone  25mg  q day.  Also on jardiance. Follow pressures.  No changes in medication today. Check metabolic panel.    Gastroesophageal reflux disease, unspecified whether esophagitis present Assessment & Plan: No upper symptoms reported. Continue protonix .    Hypothyroidism, unspecified type Assessment & Plan: On thyroid  replacement. Follow tsh.    Other orders -     Levothyroxine  Sodium; Take 1 tablet (100 mcg total) by mouth daily.  Dispense: 90 tablet; Refill: 1 -     Loratadine ; Take 1 tablet (10 mg total) by mouth daily as needed for allergies.  Dispense: 90 tablet; Refill: 1 -     Olmesartan  Medoxomil; Take 1 tablet (40 mg total) by mouth daily.  Dispense: 90 tablet; Refill: 1 -     Pantoprazole  Sodium; Take 1 tablet (20 mg total) by mouth daily.  Dispense: 90 tablet; Refill: 1 -     Pantoprazole  Sodium; Take 1 tablet (40 mg total) by mouth daily.  Dispense: 90 tablet; Refill: 1 -     Spironolactone ; Take 1 tablet (25 mg total) by mouth daily.  Dispense: 90 tablet; Refill: 1     Allena Hamilton, MD

## 2024-08-07 NOTE — Patient Instructions (Signed)
 YOUR MAMMOGRAM IS DUE, PLEASE CALL AND GET THIS SCHEDULED! [facility] - call [phone]   YOUR BONE DENISTY SCAN (dexa)  IS DUE, PLEASE CALL AND GET THIS SCHEDULED! [facility] - call [phone]

## 2024-08-07 NOTE — Assessment & Plan Note (Addendum)
 Physical today 08/07/24.  Mammogram 09/06/23 - Birads I.  Colonoscopy 2016 - per report - no f/u warranted. Schedule mammogram and bone density.

## 2024-08-08 ENCOUNTER — Ambulatory Visit: Payer: Self-pay | Admitting: Internal Medicine

## 2024-08-13 ENCOUNTER — Encounter: Payer: Self-pay | Admitting: Internal Medicine

## 2024-08-13 NOTE — Assessment & Plan Note (Signed)
 On thyroid replacement.  Follow tsh.

## 2024-08-13 NOTE — Assessment & Plan Note (Signed)
 Saw [facility] (last 10/04/23)- carotid duplex 1-39% bilaterally. Recommended f/u in 5 years with duplex ultrasound and physical exam. Also recommended f/u ABIs - stable. Recommended f/u appt one year.

## 2024-08-13 NOTE — Assessment & Plan Note (Signed)
 Has declined cholesterol medication.  Continue low cholesterol diet and exercise.  Follow lipid panel.

## 2024-08-13 NOTE — Assessment & Plan Note (Signed)
 No upper symptoms reported.  Continue protonix.

## 2024-08-13 NOTE — Assessment & Plan Note (Signed)
 Blood pressure as outlined.  Continue olmesartan . Had problems with amlodipine .  Intolerance to hydralazine .  On spironolactone  25mg  q day.  Also on jardiance. Follow pressures.  No changes in medication today. Check metabolic panel.

## 2024-08-13 NOTE — Assessment & Plan Note (Signed)
 Seeing nephrology. Evaluated - 02/01/24. Labs through nephrology 06/15/24 - GFR 38. Continue jardiance and olmesartan .

## 2024-08-13 NOTE — Assessment & Plan Note (Signed)
 Low carb diet and exercise. Follow metabolic panel and A1c.  Lab Results  Component Value Date   HGBA1C 6.7 (H) 08/07/2024

## 2024-08-13 NOTE — Assessment & Plan Note (Signed)
Dr Logan Bores (02/03/22) - discussed endometrial biopsy.  See note.  States decided to hold. Continue HRT.

## 2024-10-02 ENCOUNTER — Encounter

## 2024-10-02 ENCOUNTER — Other Ambulatory Visit

## 2024-11-20 ENCOUNTER — Ambulatory Visit
Admission: RE | Admit: 2024-11-20 | Discharge: 2024-11-20 | Disposition: A | Source: Ambulatory Visit | Attending: Internal Medicine | Admitting: Internal Medicine

## 2024-11-20 DIAGNOSIS — E2839 Other primary ovarian failure: Secondary | ICD-10-CM | POA: Diagnosis present

## 2024-11-20 DIAGNOSIS — Z1231 Encounter for screening mammogram for malignant neoplasm of breast: Secondary | ICD-10-CM | POA: Insufficient documentation

## 2024-12-11 ENCOUNTER — Ambulatory Visit: Admitting: Internal Medicine

## 2024-12-11 DIAGNOSIS — E1165 Type 2 diabetes mellitus with hyperglycemia: Secondary | ICD-10-CM

## 2024-12-11 DIAGNOSIS — N1832 Chronic kidney disease, stage 3b: Secondary | ICD-10-CM

## 2024-12-11 DIAGNOSIS — E7800 Pure hypercholesterolemia, unspecified: Secondary | ICD-10-CM
